# Patient Record
Sex: Female | Born: 1960 | Race: Black or African American | Hispanic: No | Marital: Married | Smoking: Current every day smoker
Health system: Southern US, Community
[De-identification: ages and names within clinical notes are randomized; demographics above are authoritative.]

## PROBLEM LIST (undated history)

## (undated) DIAGNOSIS — Z8719 Personal history of other diseases of the digestive system: Secondary | ICD-10-CM

## (undated) DIAGNOSIS — N200 Calculus of kidney: Secondary | ICD-10-CM

## (undated) DIAGNOSIS — I10 Essential (primary) hypertension: Secondary | ICD-10-CM

## (undated) DIAGNOSIS — J45909 Unspecified asthma, uncomplicated: Secondary | ICD-10-CM

## (undated) HISTORY — PX: TOTAL ABDOMINAL HYSTERECTOMY W/ BILATERAL SALPINGOOPHORECTOMY: SHX83

## (undated) HISTORY — PX: OTHER SURGICAL HISTORY: SHX169

---

## 1988-03-05 HISTORY — PX: UMBILICAL HERNIA REPAIR: SHX196

## 1997-10-30 ENCOUNTER — Emergency Department (HOSPITAL_COMMUNITY): Admission: EM | Admit: 1997-10-30 | Discharge: 1997-10-30 | Payer: Self-pay | Admitting: Emergency Medicine

## 1997-11-09 ENCOUNTER — Emergency Department (HOSPITAL_COMMUNITY): Admission: EM | Admit: 1997-11-09 | Discharge: 1997-11-09 | Payer: Self-pay | Admitting: Emergency Medicine

## 1997-11-23 ENCOUNTER — Encounter: Admission: RE | Admit: 1997-11-23 | Discharge: 1998-02-21 | Payer: Self-pay | Admitting: Orthopedic Surgery

## 1998-07-19 ENCOUNTER — Other Ambulatory Visit: Admission: RE | Admit: 1998-07-19 | Discharge: 1998-07-19 | Payer: Self-pay | Admitting: Ophthalmology

## 1999-08-09 ENCOUNTER — Emergency Department (HOSPITAL_COMMUNITY): Admission: EM | Admit: 1999-08-09 | Discharge: 1999-08-09 | Payer: Self-pay | Admitting: Emergency Medicine

## 2001-05-15 ENCOUNTER — Ambulatory Visit (HOSPITAL_COMMUNITY): Admission: RE | Admit: 2001-05-15 | Discharge: 2001-05-15 | Payer: Self-pay | Admitting: Family Medicine

## 2003-02-02 ENCOUNTER — Emergency Department (HOSPITAL_COMMUNITY): Admission: EM | Admit: 2003-02-02 | Discharge: 2003-02-02 | Payer: Self-pay | Admitting: Emergency Medicine

## 2003-12-08 ENCOUNTER — Emergency Department (HOSPITAL_COMMUNITY): Admission: EM | Admit: 2003-12-08 | Discharge: 2003-12-08 | Payer: Self-pay | Admitting: Emergency Medicine

## 2004-05-29 ENCOUNTER — Emergency Department (HOSPITAL_COMMUNITY): Admission: EM | Admit: 2004-05-29 | Discharge: 2004-05-29 | Payer: Self-pay | Admitting: Internal Medicine

## 2004-08-05 ENCOUNTER — Emergency Department (HOSPITAL_COMMUNITY): Admission: EM | Admit: 2004-08-05 | Discharge: 2004-08-05 | Payer: Self-pay | Admitting: Emergency Medicine

## 2005-01-19 ENCOUNTER — Emergency Department (HOSPITAL_COMMUNITY): Admission: EM | Admit: 2005-01-19 | Discharge: 2005-01-19 | Payer: Self-pay | Admitting: Emergency Medicine

## 2005-02-17 ENCOUNTER — Emergency Department (HOSPITAL_COMMUNITY): Admission: EM | Admit: 2005-02-17 | Discharge: 2005-02-17 | Payer: Self-pay | Admitting: Emergency Medicine

## 2005-04-30 ENCOUNTER — Ambulatory Visit (HOSPITAL_COMMUNITY): Admission: RE | Admit: 2005-04-30 | Discharge: 2005-04-30 | Payer: Self-pay | Admitting: Family Medicine

## 2005-05-14 ENCOUNTER — Ambulatory Visit: Payer: Self-pay | Admitting: Family Medicine

## 2005-05-25 ENCOUNTER — Ambulatory Visit: Payer: Self-pay | Admitting: Family Medicine

## 2005-08-16 ENCOUNTER — Emergency Department (HOSPITAL_COMMUNITY): Admission: EM | Admit: 2005-08-16 | Discharge: 2005-08-16 | Payer: Self-pay | Admitting: Emergency Medicine

## 2007-12-10 ENCOUNTER — Emergency Department (HOSPITAL_COMMUNITY): Admission: EM | Admit: 2007-12-10 | Discharge: 2007-12-10 | Payer: Self-pay | Admitting: Family Medicine

## 2008-04-17 ENCOUNTER — Emergency Department (HOSPITAL_COMMUNITY): Admission: EM | Admit: 2008-04-17 | Discharge: 2008-04-17 | Payer: Self-pay | Admitting: Family Medicine

## 2008-05-21 ENCOUNTER — Emergency Department (HOSPITAL_COMMUNITY): Admission: EM | Admit: 2008-05-21 | Discharge: 2008-05-21 | Payer: Self-pay | Admitting: Family Medicine

## 2008-10-16 ENCOUNTER — Emergency Department (HOSPITAL_COMMUNITY): Admission: EM | Admit: 2008-10-16 | Discharge: 2008-10-16 | Payer: Self-pay | Admitting: Family Medicine

## 2009-01-19 ENCOUNTER — Emergency Department (HOSPITAL_COMMUNITY): Admission: EM | Admit: 2009-01-19 | Discharge: 2009-01-19 | Payer: Self-pay | Admitting: Family Medicine

## 2009-04-06 ENCOUNTER — Emergency Department (HOSPITAL_COMMUNITY): Admission: EM | Admit: 2009-04-06 | Discharge: 2009-04-06 | Payer: Self-pay | Admitting: Emergency Medicine

## 2009-06-29 ENCOUNTER — Emergency Department (HOSPITAL_COMMUNITY): Admission: EM | Admit: 2009-06-29 | Discharge: 2009-06-29 | Payer: Self-pay | Admitting: Family Medicine

## 2009-10-04 ENCOUNTER — Ambulatory Visit (HOSPITAL_COMMUNITY): Admission: RE | Admit: 2009-10-04 | Discharge: 2009-10-04 | Payer: Self-pay | Admitting: Internal Medicine

## 2009-10-17 ENCOUNTER — Ambulatory Visit: Payer: Self-pay | Admitting: Family Medicine

## 2009-11-08 ENCOUNTER — Ambulatory Visit: Payer: Self-pay | Admitting: Internal Medicine

## 2009-11-08 ENCOUNTER — Encounter (INDEPENDENT_AMBULATORY_CARE_PROVIDER_SITE_OTHER): Payer: Self-pay | Admitting: Family Medicine

## 2009-11-08 LAB — CONVERTED CEMR LAB
ALT: 13 units/L (ref 0–35)
Alkaline Phosphatase: 50 units/L (ref 39–117)
Basophils Absolute: 0 10*3/uL (ref 0.0–0.1)
Eosinophils Absolute: 0.2 10*3/uL (ref 0.0–0.7)
Eosinophils Relative: 2 % (ref 0–5)
Glucose, Bld: 70 mg/dL (ref 70–99)
HCT: 41.7 % (ref 36.0–46.0)
MCHC: 31.9 g/dL (ref 30.0–36.0)
MCV: 86.3 fL (ref 78.0–100.0)
Platelets: 230 10*3/uL (ref 150–400)
RDW: 15.4 % (ref 11.5–15.5)
Sodium: 136 meq/L (ref 135–145)
Total Bilirubin: 0.3 mg/dL (ref 0.3–1.2)
Total Protein: 6.8 g/dL (ref 6.0–8.3)

## 2009-11-24 ENCOUNTER — Emergency Department (HOSPITAL_COMMUNITY): Admission: EM | Admit: 2009-11-24 | Discharge: 2009-11-24 | Payer: Self-pay | Admitting: Emergency Medicine

## 2009-12-16 ENCOUNTER — Encounter (INDEPENDENT_AMBULATORY_CARE_PROVIDER_SITE_OTHER): Payer: Self-pay | Admitting: *Deleted

## 2009-12-16 LAB — CONVERTED CEMR LAB
HDL: 51 mg/dL (ref >39)
Total CHOL/HDL Ratio: 3.4
Triglycerides: 181 mg/dL — ABNORMAL HIGH (ref <150)

## 2010-03-05 ENCOUNTER — Emergency Department (HOSPITAL_COMMUNITY)
Admission: EM | Admit: 2010-03-05 | Discharge: 2010-03-06 | Payer: Self-pay | Source: Home / Self Care | Admitting: Emergency Medicine

## 2010-05-15 LAB — DIFFERENTIAL
Lymphocytes Relative: 22 % (ref 12–46)
Lymphs Abs: 2.5 10*3/uL (ref 0.7–4.0)
Monocytes Absolute: 0.8 10*3/uL (ref 0.1–1.0)
Monocytes Relative: 7 % (ref 3–12)
Neutro Abs: 8 10*3/uL — ABNORMAL HIGH (ref 1.7–7.7)
Neutrophils Relative %: 69 % (ref 43–77)

## 2010-05-15 LAB — CBC
MCHC: 32.6 g/dL (ref 30.0–36.0)
MCV: 84.4 fL (ref 78.0–100.0)
Platelets: 268 10*3/uL (ref 150–400)
RDW: 15.1 % (ref 11.5–15.5)
WBC: 11.6 10*3/uL — ABNORMAL HIGH (ref 4.0–10.5)

## 2010-05-15 LAB — COMPREHENSIVE METABOLIC PANEL
Albumin: 4 g/dL (ref 3.5–5.2)
Alkaline Phosphatase: 64 U/L (ref 39–117)
BUN: 4 mg/dL — ABNORMAL LOW (ref 6–23)
Calcium: 9.5 mg/dL (ref 8.4–10.5)
Creatinine, Ser: 0.98 mg/dL (ref 0.4–1.2)
Potassium: 3.8 mEq/L (ref 3.5–5.1)
Total Protein: 6.9 g/dL (ref 6.0–8.3)

## 2010-05-15 LAB — POCT CARDIAC MARKERS
CKMB, poc: 3.8 ng/mL (ref 1.0–8.0)
Myoglobin, poc: 111 ng/mL (ref 12–200)
Myoglobin, poc: 94.4 ng/mL (ref 12–200)
Troponin i, poc: <0.05 ng/mL (ref 0.00–0.09)

## 2010-06-20 LAB — POCT URINALYSIS DIP (DEVICE)
Ketones, ur: NEGATIVE mg/dL
Protein, ur: NEGATIVE mg/dL
Specific Gravity, Urine: 1.025 (ref 1.005–1.030)
Urobilinogen, UA: 0.2 mg/dL (ref 0.0–1.0)

## 2010-07-07 ENCOUNTER — Inpatient Hospital Stay (INDEPENDENT_AMBULATORY_CARE_PROVIDER_SITE_OTHER)
Admission: RE | Admit: 2010-07-07 | Discharge: 2010-07-07 | Disposition: A | Discharge status: Home / Self Care | Payer: Self-pay | Source: Ambulatory Visit | Attending: Family Medicine | Admitting: Family Medicine

## 2010-07-07 DIAGNOSIS — J019 Acute sinusitis, unspecified: Secondary | ICD-10-CM

## 2010-10-16 ENCOUNTER — Other Ambulatory Visit (HOSPITAL_COMMUNITY): Payer: Self-pay | Admitting: Family Medicine

## 2010-10-16 DIAGNOSIS — Z1231 Encounter for screening mammogram for malignant neoplasm of breast: Secondary | ICD-10-CM

## 2010-10-19 ENCOUNTER — Ambulatory Visit (HOSPITAL_COMMUNITY)
Admission: RE | Admit: 2010-10-19 | Discharge: 2010-10-19 | Disposition: A | Discharge status: Home / Self Care | Payer: Self-pay | Source: Ambulatory Visit | Attending: Family Medicine | Admitting: Family Medicine

## 2010-10-19 DIAGNOSIS — Z1231 Encounter for screening mammogram for malignant neoplasm of breast: Secondary | ICD-10-CM

## 2010-10-24 ENCOUNTER — Other Ambulatory Visit: Payer: Self-pay | Admitting: Family Medicine

## 2010-10-24 DIAGNOSIS — R928 Other abnormal and inconclusive findings on diagnostic imaging of breast: Secondary | ICD-10-CM

## 2010-10-31 ENCOUNTER — Ambulatory Visit
Admission: RE | Admit: 2010-10-31 | Discharge: 2010-10-31 | Disposition: A | Discharge status: Home / Self Care | Payer: No Typology Code available for payment source | Source: Ambulatory Visit | Attending: Family Medicine | Admitting: Family Medicine

## 2010-10-31 DIAGNOSIS — R928 Other abnormal and inconclusive findings on diagnostic imaging of breast: Secondary | ICD-10-CM

## 2011-07-10 ENCOUNTER — Other Ambulatory Visit: Payer: Self-pay

## 2011-07-10 ENCOUNTER — Other Ambulatory Visit (HOSPITAL_COMMUNITY)
Admission: RE | Admit: 2011-07-10 | Discharge: 2011-07-10 | Disposition: A | Discharge status: Home / Self Care | Payer: PRIVATE HEALTH INSURANCE | Source: Ambulatory Visit | Attending: Family Medicine | Admitting: Family Medicine

## 2011-07-10 DIAGNOSIS — Z01419 Encounter for gynecological examination (general) (routine) without abnormal findings: Secondary | ICD-10-CM | POA: Insufficient documentation

## 2012-05-23 ENCOUNTER — Ambulatory Visit (INDEPENDENT_AMBULATORY_CARE_PROVIDER_SITE_OTHER): Payer: No Typology Code available for payment source | Admitting: Internal Medicine

## 2012-05-23 ENCOUNTER — Encounter: Payer: Self-pay | Admitting: Internal Medicine

## 2012-05-23 VITALS — BP 114/84 | HR 78 | Temp 97.5°F | Ht 65.0 in | Wt 185.2 lb

## 2012-05-23 DIAGNOSIS — R5381 Other malaise: Secondary | ICD-10-CM

## 2012-05-23 DIAGNOSIS — F17200 Nicotine dependence, unspecified, uncomplicated: Secondary | ICD-10-CM

## 2012-05-23 DIAGNOSIS — Z9289 Personal history of other medical treatment: Secondary | ICD-10-CM

## 2012-05-23 DIAGNOSIS — Z716 Tobacco abuse counseling: Secondary | ICD-10-CM | POA: Insufficient documentation

## 2012-05-23 DIAGNOSIS — Z23 Encounter for immunization: Secondary | ICD-10-CM

## 2012-05-23 DIAGNOSIS — R5383 Other fatigue: Secondary | ICD-10-CM

## 2012-05-23 DIAGNOSIS — Z9189 Other specified personal risk factors, not elsewhere classified: Secondary | ICD-10-CM

## 2012-05-23 LAB — BASIC METABOLIC PANEL WITH GFR
BUN: 11 mg/dL (ref 6–23)
CO2: 25 mEq/L (ref 19–32)
Calcium: 9.6 mg/dL (ref 8.4–10.5)
GFR, Est African American: >89 mL/min
Glucose, Bld: 87 mg/dL (ref 70–99)
Potassium: 4.1 mEq/L (ref 3.5–5.3)

## 2012-05-23 LAB — LIPID PANEL
Cholesterol: 155 mg/dL (ref 0–200)
LDL Cholesterol: 102 mg/dL — ABNORMAL HIGH (ref 0–99)
Total CHOL/HDL Ratio: 3.7 Ratio
Triglycerides: 55 mg/dL (ref <150)
VLDL: 11 mg/dL (ref 0–40)

## 2012-05-23 LAB — CBC
Hemoglobin: 12.7 g/dL (ref 12.0–15.0)
MCH: 26.7 pg (ref 26.0–34.0)
MCHC: 32.9 g/dL (ref 30.0–36.0)
Platelets: 246 10*3/uL (ref 150–400)

## 2012-05-23 LAB — POCT GLYCOSYLATED HEMOGLOBIN (HGB A1C): Hemoglobin A1C: 5.3

## 2012-05-23 LAB — GLUCOSE, CAPILLARY: Glucose-Capillary: 83 mg/dL (ref 70–99)

## 2012-05-23 MED ORDER — TETANUS-DIPHTH-ACELL PERTUSSIS 5-2.5-18.5 LF-MCG/0.5 IM SUSP: 0.5000 mL | Freq: Once | INTRAMUSCULAR | Status: DC

## 2012-05-23 NOTE — Patient Instructions (Signed)
Welcome to our clinic! We are always here if you have questions or problems. Our number is [phone]. Your new doctor's name is Dr. Genella Mech.  We will give you a tetanus shot today and take some blood. I will call you with the results.   Come back in 6 months.   Smoking Cessation, Tips for Success YOU CAN QUIT SMOKING If you are ready to quit smoking, congratulations! You have chosen to help yourself be healthier. Cigarettes bring nicotine, tar, carbon monoxide, and other irritants into your body. Your lungs, heart, and blood vessels will be able to work better without these poisons. There are many different ways to quit smoking. Nicotine gum, nicotine patches, a nicotine inhaler, or nicotine nasal spray can help with physical craving. Hypnosis, support groups, and medicines help break the habit of smoking. Here are some tips to help you quit for good.  Throw away all cigarettes.  Clean and remove all ashtrays from your home, work, and car.  On a card, write down your reasons for quitting. Carry the card with you and read it when you get the urge to smoke.  Cleanse your body of nicotine. Drink enough water and fluids to keep your urine clear or pale yellow. Do this after quitting to flush the nicotine from your body.  Learn to predict your moods. Do not let a bad situation be your excuse to have a cigarette. Some situations in your life might tempt you into wanting a cigarette.  Never have "just one" cigarette. It leads to wanting another and another. Remind yourself of your decision to quit.  Change habits associated with smoking. If you smoked while driving or when feeling stressed, try other activities to replace smoking. Stand up when drinking your coffee. Brush your teeth after eating. Sit in a different chair when you read the paper. Avoid alcohol while trying to quit, and try to drink fewer caffeinated beverages. Alcohol and caffeine may urge you to smoke.  Avoid foods and  drinks that can trigger a desire to smoke, such as sugary or spicy foods and alcohol.  Ask people who smoke not to smoke around you.  Have something planned to do right after eating or having a cup of coffee. Take a walk or exercise to perk you up. This will help to keep you from overeating.  Try a relaxation exercise to calm you down and decrease your stress. Remember, you may be tense and nervous for the first 2 weeks after you quit, but this will pass.  Find new activities to keep your hands busy. Play with a pen, coin, or rubber band. Doodle or draw things on paper.  Brush your teeth right after eating. This will help cut down on the craving for the taste of tobacco after meals. You can try mouthwash, too.  Use oral substitutes, such as lemon drops, carrots, a cinnamon stick, or chewing gum, in place of cigarettes. Keep them handy so they are available when you have the urge to smoke.  When you have the urge to smoke, try deep breathing.  Designate your home as a nonsmoking area.  If you are a heavy smoker, ask your caregiver about a prescription for nicotine chewing gum. It can ease your withdrawal from nicotine.  Reward yourself. Set aside the cigarette money you save and buy yourself something nice.  Look for support from others. Join a support group or smoking cessation program. Ask someone at home or at work to help you with your  plan to quit smoking.  Always ask yourself, "Do I need this cigarette or is this just a reflex?" Tell yourself, "Today, I choose not to smoke," or "I do not want to smoke." You are reminding yourself of your decision to quit, even if you do smoke a cigarette. HOW WILL I FEEL WHEN I QUIT SMOKING?  The benefits of not smoking start within days of quitting.  You may have symptoms of withdrawal because your body is used to nicotine (the addictive substance in cigarettes). You may crave cigarettes, be irritable, feel very hungry, cough often, get headaches,  or have difficulty concentrating.  The withdrawal symptoms are only temporary. They are strongest when you first quit but will go away within 10 to 14 days.  When withdrawal symptoms occur, stay in control. Think about your reasons for quitting. Remind yourself that these are signs that your body is healing and getting used to being without cigarettes.  Remember that withdrawal symptoms are easier to treat than the major diseases that smoking can cause.  Even after the withdrawal is over, expect periodic urges to smoke. However, these cravings are generally short-lived and will go away whether you smoke or not. Do not smoke!  If you relapse and smoke again, do not lose hope. Most smokers quit 3 times before they are successful.  If you relapse, do not give up! Plan ahead and think about what you will do the next time you get the urge to smoke. LIFE AS A NONSMOKER: MAKE IT FOR A MONTH, MAKE IT FOR LIFE Day 1: Hang this page where you will see it every day. Day 2: Get rid of all ashtrays, matches, and lighters. Day 3: Drink water. Breathe deeply between sips. Day 4: Avoid places with smoke-filled air, such as bars, clubs, or the smoking section of restaurants. Day 5: Keep track of how much money you save by not smoking. Day 6: Avoid boredom. Keep a good book with you or go to the movies. Day 7: Reward yourself! One week without smoking! Day 8: Make a dental appointment to get your teeth cleaned. Day 9: Decide how you will turn down a cigarette before it is offered to you. Day 10: Review your reasons for quitting. Day 11: Distract yourself. Stay active to keep your mind off smoking and to relieve tension. Take a walk, exercise, read a book, do a crossword puzzle, or try a new hobby. Day 12: Exercise. Get off the bus before your stop or use stairs instead of escalators. Day 13: Call on friends for support and encouragement. Day 14: Reward yourself! Two weeks without smoking! Day 15: Practice  deep breathing exercises. Day 16: Bet a friend that you can stay a nonsmoker. Day 17: Ask to sit in nonsmoking sections of restaurants. Day 18: Hang up "No Smoking" signs. Day 19: Think of yourself as a nonsmoker. Day 20: Each morning, tell yourself you will not smoke. Day 21: Reward yourself! Three weeks without smoking! Day 22: Think of smoking in negative ways. Remember how it stains your teeth, gives you bad breath, and leaves you short of breath. Day 23: Eat a nutritious breakfast. Day 24:Do not relive your days as a smoker. Day 25: Hold a pencil in your hand when talking on the telephone. Day 26: Tell all your friends you do not smoke. Day 27: Think about how much better food tastes. Day 28: Remember, one cigarette is one too many. Day 29: Take up a hobby that will keep your  hands busy. Day 30: Congratulations! One month without smoking! Give yourself a big reward. Your caregiver can direct you to community resources or hospitals for support, which may include:  Group support.  Education.  Hypnosis.  Subliminal therapy. Document Released: 11/18/2003 Document Revised: 05/14/2011 Document Reviewed: 12/06/2008 [facility] Patient Information 2013 [place], [place].

## 2012-05-23 NOTE — Assessment & Plan Note (Signed)
  Assessment:  Progress toward smoking cessation:  smoking the same amount  Barriers to progress toward smoking cessation:  none  Comments:   Plan:  Instruction/counseling given:  I advised patient to stop smoking.  Educational resources provided:  smoking cessation handout (tips, strategies, fact sheets)  Self management tools provided:  smoking cessation plan (STAR Quit Plan)  Medications to assist with smoking cessation:  none Patient agreed to the following self-care plans for smoking cessation:  go to the [employer] ([email or web address])   Other:

## 2012-05-23 NOTE — Progress Notes (Signed)
Subjective:     Patient ID: Christy Burke, female   DOB: 02/18/1961, 53 y.o.   MRN: [id number]  HPI The patient is a 52 year old female who comes in today as a new patient from [facility]. She states that she has no past medical history and takes no medications. She states that she's never had a problem with her sugars, her cholesterol, her blood pressure. She states that she does smoke one half pack per day although she recently quit for 120 days and then restarted due to some stress in her life. She states that she did used to do cocaine however quit several years ago and is nontender to restart. She also used to drink heavily per her words although quit several years ago. She does not have any breathing problems. She does not have any chest pain, shortness of breath, nausea, vomiting, diarrhea. She would like to be tested for HIV at today's visit. She did recently have Pap smear last May. She cannot remember when her last tetanus shot was although it may have been slightly less than 10 years ago.  Past medical history, social history, family history, allergies, medications, problem list were all updated and reviewed at today's visit.  Review of Systems  Constitutional: Negative for fever, chills, diaphoresis, activity change, appetite change, fatigue and unexpected weight change.  HENT: Negative.   Eyes: Negative.   Respiratory: Negative for cough, chest tightness, shortness of breath and wheezing.   Cardiovascular: Negative for chest pain, palpitations and leg swelling.  Gastrointestinal: Negative for nausea, vomiting, abdominal pain, diarrhea, constipation and blood in stool.  Endocrine: Negative.   Genitourinary: Negative.   Musculoskeletal: Negative for myalgias, back pain, joint swelling, arthralgias and gait problem.  Skin: Negative for color change, pallor, rash and wound.  Allergic/Immunologic: Negative.   Neurological: Negative for dizziness, tremors, seizures, syncope, facial  asymmetry, speech difficulty, weakness, light-headedness, numbness and headaches.  Hematological: Negative.   Psychiatric/Behavioral: Negative.        Objective:   Physical Exam  Constitutional: She is oriented to person, place, and time. She appears well-developed and well-nourished.  Hair in deadlocks.  HENT:  Head: Normocephalic and atraumatic.  Eyes: EOM are normal. Pupils are equal, round, and reactive to light.  Neck: Normal range of motion. Neck supple. No JVD present. No tracheal deviation present. No thyromegaly present.  Cardiovascular: Normal rate and regular rhythm.   Pulmonary/Chest: Effort normal and breath sounds normal. No respiratory distress. She has no wheezes. She has no rales.  Abdominal: Soft. Bowel sounds are normal. She exhibits no distension. There is no tenderness. There is no rebound.  Musculoskeletal: Normal range of motion. She exhibits no edema and no tenderness.  Neurological: She is alert and oriented to person, place, and time. No cranial nerve deficit. Coordination normal.  Skin: Skin is warm and dry. No rash noted. No erythema. No pallor.       Assessment/Plan   1. Please see problem oriented charting.  2 Disposition-patient be seen back in 6 months for routine followup visit. She was given Tdap at today's visit. She was also given order for mammography. She also had CBC, basic metabolic panel, lipid panel, hemoglobin A1c. She does not wish flu shot at today's visit. She was advised that she should quit smoking and she was given information regarding smoking cessation tips.

## 2012-05-30 ENCOUNTER — Ambulatory Visit (HOSPITAL_COMMUNITY)
Admission: RE | Admit: 2012-05-30 | Discharge: 2012-05-30 | Disposition: A | Discharge status: Home / Self Care | Payer: No Typology Code available for payment source | Source: Ambulatory Visit | Attending: Internal Medicine | Admitting: Internal Medicine

## 2012-05-30 ENCOUNTER — Ambulatory Visit (HOSPITAL_COMMUNITY): Payer: No Typology Code available for payment source

## 2012-05-30 DIAGNOSIS — Z9289 Personal history of other medical treatment: Secondary | ICD-10-CM

## 2012-05-30 DIAGNOSIS — Z1231 Encounter for screening mammogram for malignant neoplasm of breast: Secondary | ICD-10-CM | POA: Insufficient documentation

## 2012-10-15 ENCOUNTER — Ambulatory Visit (INDEPENDENT_AMBULATORY_CARE_PROVIDER_SITE_OTHER): Payer: No Typology Code available for payment source | Admitting: Internal Medicine

## 2012-10-15 ENCOUNTER — Encounter: Payer: Self-pay | Admitting: Internal Medicine

## 2012-10-15 VITALS — BP 118/83 | HR 93 | Temp 97.0°F | Ht 67.0 in | Wt 184.2 lb

## 2012-10-15 DIAGNOSIS — R229 Localized swelling, mass and lump, unspecified: Secondary | ICD-10-CM | POA: Insufficient documentation

## 2012-10-15 NOTE — Patient Instructions (Signed)
Please return to the clinic to see Dr. Dorise Hiss in approximately 6 weeks for your annual physical and to recheck you nodule  1. Continue monitoring the left nodule on your face; today it measures slightly less than 1cm 2. Notify Dr. Dorise Hiss of any changes noted

## 2012-10-15 NOTE — Assessment & Plan Note (Signed)
Pt reports a small non-pruritic, non-painful nodule on the left side of her face; denies any fever/chills, fatigue, night sweats, or any additional lumps or bumps.   She has never had anything like this before and is concerned about what this might be.  It measures about 1cm X 0.5cm and is slightly tender to touch and without erythema.  Possibilities include a parotid gland stone, however it is slightly posterior to be the parotid gland.    -monitor the nodule closely and observe if it is increasing in size  -f/u with Dr. Dorise Hiss at your regular visit in about 6 weeks -may consider an ENT referral if increasing in size or does not resolve

## 2012-10-15 NOTE — Progress Notes (Signed)
I saw and evaluated the patient.  I personally confirmed the key portions of the history and exam documented by Dr. Delane Ginger and I reviewed pertinent patient test results.  The assessment, diagnosis, and plan were formulated together and I agree with the documentation in the resident's note. There is only the isolated finding anterior to the earlobe. No post auricular, cervical, supraclavicular LAD. No parotid gland tenderness. Agree with reassessment.

## 2012-10-15 NOTE — Progress Notes (Signed)
Patient ID: Christy Burke, female   DOB: 1960/12/16, 52 y.o.   MRN: [id number]            HPI: [name] is a 52 y.o. here for an acute visit for a lump on the left side of her face.  She recently established care here with Dr. Dorise Hiss in March.  She has no PMH and takes no medications.  She reports that a couple of days ago she was lying in bed and feeling her face when she noticed a small lump on the left side of her face.  She denies any fever/chills, fatigue, night sweats, or any additional lumps or bumps.  She states it is not painful to touch and does not itch.  She has never had anything like this before and is concerned about what this might be.     No past medical history on file. No current outpatient prescriptions on file.   No current facility-administered medications for this visit.   Family History  Problem Relation Age of Onset  . Cancer Mother   . Diabetes Father    History   Social History  . Marital Status: Single    Spouse Name: N/A    Number of Children: N/A  . Years of Education: N/A   Social History Main Topics  . Smoking status: Current Every Day Smoker -- 0.50 packs/day    Types: Cigarettes  . Smokeless tobacco: None  . Alcohol Use: No     Comment: Quit several years ago  . Drug Use: No     Comment: Quit several years ago, never did IVDU only smoked cocaine  . Sexual Activity: Yes     Comment: In same sex relationship for 19 years   Other Topics Concern  . None   Social History Narrative  . None    Review of Systems: Constitutional: Denies fever, chills, diaphoresis, appetite change and fatigue.  Respiratory: Denies SOB, DOE, cough, chest tightness, and wheezing.  Cardiovascular: No chest pain, palpitations and leg swelling.  Gastrointestinal: No abdominal pain, nausea, vomiting, bloody stools Genitourinary: No dysuria, frequency, hematuria, or flank pain.  Musculoskeletal: No myalgias, back pain, joint swelling, arthralgias     Objective:  Physical Exam: Filed Vitals:   10/15/12 0852  BP: 118/83  Pulse: 93  Temp: 97 F (36.1 C)  TempSrc: Oral  Height: 5\' 7"  (1.702 m)  Weight: 184 lb 3.2 oz (83.553 kg)  SpO2: 98%   General: Well nourished. No acute distress.  Neck: There is no lymphadenopathy Lungs: CTA bilaterally. Heart: RRR; no extra sounds or murmurs. Abdomen: Non-distended, normal BS, soft, nontender; no hepatosplenomegaly  Extremities: No pedal edema. No joint swelling or tenderness. Neurologic: Alert and oriented x3. No obvious neurologic deficits. Skin: Left side of face inferior to the left cheek she has about a 1cm X 0.5 cm nodule not visible by observation-only palpable with touch.  There is no erythema but is slightly tender to palpation if you press deeper.   Assessment & Plan:  I have discussed my assessment and plan with Dr. Rogelia Boga  as detailed under problem based charting.

## 2012-11-21 ENCOUNTER — Encounter: Payer: Self-pay | Admitting: Internal Medicine

## 2012-11-21 ENCOUNTER — Ambulatory Visit (INDEPENDENT_AMBULATORY_CARE_PROVIDER_SITE_OTHER): Payer: No Typology Code available for payment source | Admitting: Internal Medicine

## 2012-11-21 VITALS — BP 111/77 | HR 67 | Temp 98.4°F | Ht 66.0 in | Wt 185.1 lb

## 2012-11-21 DIAGNOSIS — Z0000 Encounter for general adult medical examination without abnormal findings: Secondary | ICD-10-CM

## 2012-11-21 DIAGNOSIS — Z09 Encounter for follow-up examination after completed treatment for conditions other than malignant neoplasm: Secondary | ICD-10-CM

## 2012-11-21 DIAGNOSIS — F17200 Nicotine dependence, unspecified, uncomplicated: Secondary | ICD-10-CM

## 2012-11-21 NOTE — Patient Instructions (Signed)
We will send you for a colon cancer screening test.   I have given you some back stretching exercises that you can do and you can also use heat or tylenol for your back pain.  We will see you back in 1 year and call us if you need [place] before then at [phone].   Back Exercises These exercises may help you when beginning to rehabilitate your injury. Your symptoms may resolve with or without further involvement from your physician, physical therapist or athletic trainer. While completing these exercises, remember:   Restoring tissue flexibility helps normal motion to return to the joints. This allows healthier, less painful movement and activity.  An effective stretch should be held for at least 30 seconds.  A stretch should never be painful. You should only feel a gentle lengthening or release in the stretched tissue. STRETCH  Extension, Prone on Elbows   Lie on your stomach on the floor, a bed will be too soft. Place your palms about shoulder width apart and at the height of your head.  Place your elbows under your shoulders. If this is too painful, stack pillows under your chest.  Allow your body to relax so that your hips drop lower and make contact more completely with the floor.  Hold this position for __________ seconds.  Slowly return to lying flat on the floor. Repeat __________ times. Complete this exercise __________ times per day.  RANGE OF MOTION  Extension, Prone Press Ups   Lie on your stomach on the floor, a bed will be too soft. Place your palms about shoulder width apart and at the height of your head.  Keeping your back as relaxed as possible, slowly straighten your elbows while keeping your hips on the floor. You may adjust the placement of your hands to maximize your comfort. As you gain motion, your hands will come more underneath your shoulders.  Hold this position __________ seconds.  Slowly return to lying flat on the floor. Repeat __________ times.  Complete this exercise __________ times per day.  RANGE OF MOTION- Quadruped, Neutral Spine   Assume a hands and knees position on a firm surface. Keep your hands under your shoulders and your knees under your hips. You may place padding under your knees for comfort.  Drop your head and point your tail bone toward the ground below you. This will round out your low back like an angry cat. Hold this position for __________ seconds.  Slowly lift your head and release your tail bone so that your back sags into a large arch, like an old horse.  Hold this position for __________ seconds.  Repeat this until you feel limber in your low back.  Now, find your "sweet spot." This will be the most comfortable position somewhere between the two previous positions. This is your neutral spine. Once you have found this position, tense your stomach muscles to support your low back.  Hold this position for __________ seconds. Repeat __________ times. Complete this exercise __________ times per day.  STRETCH  Flexion, Single Knee to Chest   Lie on a firm bed or floor with both legs extended in front of you.  Keeping one leg in contact with the floor, bring your opposite knee to your chest. Hold your leg in place by either grabbing behind your thigh or at your knee.  Pull until you feel a gentle stretch in your low back. Hold __________ seconds.  Slowly release your grasp and repeat the exercise with  the opposite side. Repeat __________ times. Complete this exercise __________ times per day.  STRETCH - Hamstrings, Standing  Stand or sit and extend your right / left leg, placing your foot on a chair or foot stool  Keeping a slight arch in your low back and your hips straight forward.  Lead with your chest and lean forward at the waist until you feel a gentle stretch in the back of your right / left knee or thigh. (When done correctly, this exercise requires leaning only a small distance.)  Hold this  position for __________ seconds. Repeat __________ times. Complete this stretch __________ times per day. STRENGTHENING  Deep Abdominals, Pelvic Tilt   Lie on a firm bed or floor. Keeping your legs in front of you, bend your knees so they are both pointed toward the ceiling and your feet are flat on the floor.  Tense your lower abdominal muscles to press your low back into the floor. This motion will rotate your pelvis so that your tail bone is scooping upwards rather than pointing at your feet or into the floor.  With a gentle tension and even breathing, hold this position for __________ seconds. Repeat __________ times. Complete this exercise __________ times per day.  STRENGTHENING  Abdominals, Crunches   Lie on a firm bed or floor. Keeping your legs in front of you, bend your knees so they are both pointed toward the ceiling and your feet are flat on the floor. Cross your arms over your chest.  Slightly tip your chin down without bending your neck.  Tense your abdominals and slowly lift your trunk high enough to just clear your shoulder blades. Lifting higher can put excessive stress on the low back and does not further strengthen your abdominal muscles.  Control your return to the starting position. Repeat __________ times. Complete this exercise __________ times per day.  STRENGTHENING  Quadruped, Opposite UE/LE Lift   Assume a hands and knees position on a firm surface. Keep your hands under your shoulders and your knees under your hips. You may place padding under your knees for comfort.  Find your neutral spine and gently tense your abdominal muscles so that you can maintain this position. Your shoulders and hips should form a rectangle that is parallel with the floor and is not twisted.  Keeping your trunk steady, lift your right hand no higher than your shoulder and then your left leg no higher than your hip. Make sure you are not holding your breath. Hold this position  __________ seconds.  Continuing to keep your abdominal muscles tense and your back steady, slowly return to your starting position. Repeat with the opposite arm and leg. Repeat __________ times. Complete this exercise __________ times per day. Document Released: 03/09/2005 Document Revised: 05/14/2011 Document Reviewed: 06/03/2008 [facility] Patient Information 2014 [place], [place].

## 2012-11-21 NOTE — Progress Notes (Signed)
Subjective:     Patient ID: Christy Burke, female   DOB: 07-30-1960, 52 y.o.   MRN: [id number]  HPI  The patient is a 52 year old female with past medical history of tobacco abuse who comes in today for follow up. She was recently a new patient at this practice. She was seen in clinic about 1 month ago for a lump on her face and was given reassurance. She does not have any breathing problems. She does not have any chest pain, shortness of breath, nausea, vomiting, diarrhea. She states that she does have some back discomfort after work at times. She does physical labor at work and states that some motrin did not improve the pain. She did feel better after a warm shower.   Review of Systems  Constitutional: Negative for fever, chills, diaphoresis, activity change, appetite change, fatigue and unexpected weight change.  HENT: Negative.   Eyes: Negative.   Respiratory: Negative for cough, chest tightness, shortness of breath and wheezing.   Cardiovascular: Negative for chest pain, palpitations and leg swelling.  Gastrointestinal: Negative for nausea, vomiting, abdominal pain, diarrhea, constipation and blood in stool.  Endocrine: Negative.   Genitourinary: Negative.   Musculoskeletal: Negative for myalgias, back pain, joint swelling, arthralgias and gait problem.  Skin: Negative for color change, pallor, rash and wound.  Allergic/Immunologic: Negative.   Neurological: Negative for dizziness, tremors, seizures, syncope, facial asymmetry, speech difficulty, weakness, light-headedness, numbness and headaches.  Hematological: Negative.   Psychiatric/Behavioral: Negative.       Objective:   Physical Exam  Constitutional: She is oriented to person, place, and time. She appears well-developed and well-nourished.  Hair in deadlocks.  HENT:  Head: Normocephalic and atraumatic.  Eyes: EOM are normal. Pupils are equal, round, and reactive to light.  Neck: Normal range of motion. Neck supple. No JVD  present. No tracheal deviation present. No thyromegaly present.  Cardiovascular: Normal rate and regular rhythm.   Pulmonary/Chest: Effort normal and breath sounds normal. No respiratory distress. She has no wheezes. She has no rales.  Abdominal: Soft. Bowel sounds are normal. She exhibits no distension. There is no tenderness. There is no rebound.  Musculoskeletal: Normal range of motion. She exhibits no edema and no tenderness.  Neurological: She is alert and oriented to person, place, and time. No cranial nerve deficit. Coordination normal.  Skin: Skin is warm and dry. No rash noted. No erythema. No pallor.       Assessment/Plan   1. Please see problem oriented charting.  2 Disposition-Patient will be seen back in 1 year for routine followup visit. She does not wish flu shot at today's visit. She was advised that she should quit smoking and she was given information regarding smoking cessation tips. She will use tylenol or heat on her back pain and stretching exercises were given.

## 2012-11-22 NOTE — Assessment & Plan Note (Signed)
  Assessment: Progress toward smoking cessation:  smoking the same amount Barriers to progress toward smoking cessation:  lack of motivation to quit;stress Comments:   Plan: Instruction/counseling given:  I counseled patient on the dangers of tobacco use, advised patient to stop smoking, and reviewed strategies to maximize success. Educational resources provided:  [employer]) brochure Self management tools provided:    Medications to assist with smoking cessation:  None Patient agreed to the following self-care plans for smoking cessation:    Other plans:

## 2012-11-28 NOTE — Progress Notes (Signed)
Case discussed with Dr. Kollar soon after the resident saw the patient.  We reviewed the resident's history and exam and pertinent patient test results.  I agree with the assessment, diagnosis, and plan of care documented in the resident's note. 

## 2013-01-08 ENCOUNTER — Ambulatory Visit (AMBULATORY_SURGERY_CENTER): Payer: Self-pay | Admitting: *Deleted

## 2013-01-08 VITALS — Ht 65.5 in | Wt 188.8 lb

## 2013-01-08 DIAGNOSIS — Z1211 Encounter for screening for malignant neoplasm of colon: Secondary | ICD-10-CM

## 2013-01-08 MED ORDER — MOVIPREP 100 G PO SOLR: ORAL | Status: DC

## 2013-01-22 ENCOUNTER — Encounter: Payer: Self-pay | Admitting: Internal Medicine

## 2013-01-22 ENCOUNTER — Ambulatory Visit (AMBULATORY_SURGERY_CENTER): Payer: No Typology Code available for payment source | Admitting: Internal Medicine

## 2013-01-22 VITALS — BP 127/94 | HR 65 | Temp 96.9°F | Resp 20 | Ht 65.0 in | Wt 188.0 lb

## 2013-01-22 DIAGNOSIS — Z1211 Encounter for screening for malignant neoplasm of colon: Secondary | ICD-10-CM

## 2013-01-22 MED ORDER — SODIUM CHLORIDE 0.9 % IV SOLN: 500.0000 mL | INTRAVENOUS | Status: DC

## 2013-01-22 NOTE — Progress Notes (Signed)
Patient did not have preoperative order for IV antibiotic SSI prophylaxis. (G8918)  Patient did not experience any of the following events: a burn prior to discharge; a fall within the facility; wrong site/side/patient/procedure/implant event; or a hospital transfer or hospital admission upon discharge from the facility. (G8907)  

## 2013-01-22 NOTE — Patient Instructions (Signed)
YOU HAD AN ENDOSCOPIC PROCEDURE TODAY AT THE  ENDOSCOPY CENTER: Refer to the procedure report that was given to you for any specific questions about what was found during the examination.  If the procedure report does not answer your questions, please call your gastroenterologist to clarify.  If you requested that your care partner not be given the details of your procedure findings, then the procedure report has been included in a sealed envelope for you to review at your convenience later.  YOU SHOULD EXPECT: Some feelings of bloating in the abdomen. Passage of more gas than usual.  Walking can help get rid of the air that was put into your GI tract during the procedure and reduce the bloating. If you had a lower endoscopy (such as a colonoscopy or flexible sigmoidoscopy) you may notice spotting of blood in your stool or on the toilet paper. If you underwent a bowel prep for your procedure, then you may not have a normal bowel movement for a few days.  DIET: Your first meal following the procedure should be a light meal and then it is ok to progress to your normal diet.  A half-sandwich or bowl of soup is an example of a good first meal.  Heavy or fried foods are harder to digest and may make you feel nauseous or bloated.  Likewise meals heavy in dairy and vegetables can cause extra gas to form and this can also increase the bloating.  Drink plenty of fluids but you should avoid alcoholic beverages for 24 hours.  ACTIVITY: Your care partner should take you home directly after the procedure.  You should plan to take it easy, moving slowly for the rest of the day.  You can resume normal activity the day after the procedure however you should NOT DRIVE or use heavy machinery for 24 hours (because of the sedation medicines used during the test).    SYMPTOMS TO REPORT IMMEDIATELY: A gastroenterologist can be reached at any hour.  During normal business hours, 8:30 AM to 5:00 PM Monday through Friday,  call [phone].  After hours and on weekends, please call the GI answering service at [phone] who will take a message and have the physician on call contact you.   Following lower endoscopy (colonoscopy or flexible sigmoidoscopy):  Excessive amounts of blood in the stool  Significant tenderness or worsening of abdominal pains  Swelling of the abdomen that is new, acute  Fever of 100F or higher  FOLLOW UP: If any biopsies were taken you will be contacted by phone or by letter within the next 1-3 weeks.  Call your gastroenterologist if you have not heard about the biopsies in 3 weeks.  Our staff will call the home number listed on your records the next business day following your procedure to check on you and address any questions or concerns that you may have at that time regarding the information given to you following your procedure. This is a courtesy call and so if there is no answer at the home number and we have not heard from you through the emergency physician on call, we will assume that you have returned to your regular daily activities without incident.  SIGNATURES/CONFIDENTIALITY: You and/or your care partner have signed paperwork which will be entered into your electronic medical record.  These signatures attest to the fact that that the information above on your After Visit Summary has been reviewed and is understood.  Full responsibility of the confidentiality of this   discharge information lies with you and/or your care-partner.  Recommendations See procedure report  

## 2013-01-22 NOTE — Progress Notes (Signed)
Lidocaine-40mg IV prior to Propofol InductionPropofol given over incremental dosages 

## 2013-01-22 NOTE — Op Note (Signed)
[facility] [address] [place], [address]   COLONOSCOPY PROCEDURE REPORT  PATIENT: Christy Burke, Christy Burke  MR#: [id number] BIRTHDATE: [birth year] , 52  yrs. old GENDER: Female ENDOSCOPIST: Roxy Cedar, MD REFERRED AO:[id number] Dorise Hiss, M.D. PROCEDURE DATE:  01/22/2013 PROCEDURE:   Colonoscopy, screening First Screening Colonoscopy - Avg.  risk and is 50 yrs.  old or older Yes.  Prior Negative Screening - Now for repeat screening. N/A  History of Adenoma - Now for follow-up colonoscopy & has been > or = to 3 yrs.  N/A  Polyps Removed Today? No.  Recommend repeat exam, <10 yrs? No. ASA CLASS:   Class II INDICATIONS:average risk screening. MEDICATIONS: MAC sedation, administered by CRNA and propofol (Diprivan) 300mg  IV  DESCRIPTION OF PROCEDURE:   After the risks benefits and alternatives of the procedure were thoroughly explained, informed consent was obtained.  A digital rectal exam revealed no abnormalities of the rectum.   The LB [id number] and [id number]  endoscope was introduced through the anus and advanced to the cecum, which was identified by both the appendix and ileocecal valve. No adverse events experienced.   The quality of the prep was excellent, using MoviPrep  The instrument was then slowly withdrawn as the colon was fully examined.    COLON FINDINGS: A Diverticulum was noted at the cecum.   The colon was otherwise normal.  There was no additional diverticulosis, inflammation, polyps or cancers .  Retroflexed views revealed no abnormalities. The time to cecum=3 minutes 0 seconds.  Withdrawal time=12 minutes 25 seconds.  The scope was withdrawn and the procedure completed. COMPLICATIONS: There were no complications.  ENDOSCOPIC IMPRESSION: 1.   Diverticulum, cecum 2.   The colon was otherwise normal  RECOMMENDATIONS: 1. Continue current colorectal screening recommendations for "routine risk" patients with a repeat colonoscopy  in 10 years.   eSigned:  Roxy Cedar, MD 01/22/2013 12:22 PM   cc: The Patient    ; Gerre Couch

## 2013-01-23 ENCOUNTER — Telehealth: Payer: Self-pay | Admitting: *Deleted

## 2013-01-23 NOTE — Telephone Encounter (Signed)
No answer, left message to call if questions or concerns. 

## 2013-01-28 ENCOUNTER — Encounter (HOSPITAL_COMMUNITY): Payer: Self-pay | Admitting: Emergency Medicine

## 2013-01-28 ENCOUNTER — Emergency Department (INDEPENDENT_AMBULATORY_CARE_PROVIDER_SITE_OTHER)
Admission: EM | Admit: 2013-01-28 | Discharge: 2013-01-28 | Disposition: A | Discharge status: Home / Self Care | Payer: No Typology Code available for payment source | Source: Home / Self Care

## 2013-01-28 DIAGNOSIS — R0781 Pleurodynia: Secondary | ICD-10-CM

## 2013-01-28 DIAGNOSIS — R079 Chest pain, unspecified: Secondary | ICD-10-CM

## 2013-01-28 DIAGNOSIS — R071 Chest pain on breathing: Secondary | ICD-10-CM

## 2013-01-28 DIAGNOSIS — R0789 Other chest pain: Secondary | ICD-10-CM

## 2013-01-28 DIAGNOSIS — R319 Hematuria, unspecified: Secondary | ICD-10-CM

## 2013-01-28 LAB — POCT PREGNANCY, URINE: Preg Test, Ur: NEGATIVE

## 2013-01-28 LAB — POCT URINALYSIS DIP (DEVICE)
Bilirubin Urine: NEGATIVE
Glucose, UA: NEGATIVE mg/dL
Ketones, ur: NEGATIVE mg/dL
Nitrite: NEGATIVE
Urobilinogen, UA: 0.2 mg/dL (ref 0.0–1.0)
pH: 6.5 (ref 5.0–8.0)

## 2013-01-28 MED ORDER — TRAMADOL HCL 50 MG PO TABS: 50.0000 mg | ORAL_TABLET | Freq: Four times a day (QID) | ORAL | Status: DC | PRN

## 2013-01-28 MED ORDER — DICLOFENAC POTASSIUM 50 MG PO TABS: 50.0000 mg | ORAL_TABLET | Freq: Three times a day (TID) | ORAL | Status: DC

## 2013-01-28 MED ORDER — KETOROLAC TROMETHAMINE 60 MG/2ML IM SOLN
INTRAMUSCULAR | Status: AC
  Filled 2013-01-28: qty 2

## 2013-01-28 MED ORDER — KETOROLAC TROMETHAMINE 60 MG/2ML IM SOLN
60.0000 mg | Freq: Once | INTRAMUSCULAR | Status: AC
  Administered 2013-01-28: 60 mg via INTRAMUSCULAR

## 2013-01-28 NOTE — ED Provider Notes (Signed)
CSN: [id number]     Arrival date & time 01/28/13  1218 History   First MD Initiated Contact with Patient 01/28/13 1343     Chief Complaint  Patient presents with  . Flank Pain   (Consider location/radiation/quality/duration/timing/severity/associated sxs/prior Treatment) HPI Comments: 52 year old female presents with left low back pain for one month. Pain is located along the left posterior and lateral eighth and ninth ribs. It is worse when lying on the left side. It is intermittent, occurs approximately every other day. The intensity of pain waxes and wanes. She denies any known injury or blunt trauma. She has been evaluated by her physician and was given exercises to perform. She states that she does not need to do this but calls her work requires exercises and stretches prior to performing her job duties.  There is an incidental finding of hematuria. Patient states that she completed her menses approximately 5 days ago. She denies any urinary symptoms.   History reviewed. No pertinent past medical history. Past Surgical History  Procedure Laterality Date  . Umbilical hernia repair  1990   Family History  Problem Relation Age of Onset  . Cancer Mother   . Diabetes Father   . Colon cancer Neg Hx    History  Substance Use Topics  . Smoking status: Current Every Day Smoker -- 0.50 packs/day    Types: Cigarettes  . Smokeless tobacco: Never Used  . 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  HENT: Negative.   Respiratory: Negative.  Negative for chest tightness, shortness of breath and wheezing.   Cardiovascular: Negative.   Gastrointestinal: Negative.   Genitourinary: Negative.  Negative for dysuria, urgency, frequency, decreased urine volume, vaginal  bleeding, difficulty urinating, menstrual problem and pelvic pain.  Musculoskeletal:       As per HPI  Skin: Negative for color change, pallor and rash.  Neurological: Negative.     Allergies  Review of patient's allergies indicates no known allergies.  Home Medications   Current Outpatient Rx  Name  Route  Sig  Dispense  Refill  . Ibuprofen (MOTRIN IB PO)   Oral   Take by mouth.         . diclofenac (CATAFLAM) 50 MG tablet   Oral   Take 1 tablet (50 mg total) by mouth 3 (three) times daily.   21 tablet   0   . traMADol (ULTRAM) 50 MG tablet   Oral   Take 1 tablet (50 mg total) by mouth every 6 (six) hours as needed.   15 tablet   0    BP 127/88  Pulse 70  Temp(Src) 97.7 F (36.5 C) (Oral)  Resp 18  SpO2 96%  LMP 01/19/2013 Physical Exam  Nursing note and vitals reviewed. Constitutional: She is oriented to person, place, and time. She appears well-developed and well-nourished. No distress.  HENT:  Head: Normocephalic and atraumatic.  Eyes: EOM are normal.  Neck: Normal range of motion. Neck supple.  Pulmonary/Chest: Effort normal and breath sounds normal. No respiratory distress.  Abdominal: Soft. There is no tenderness.  No abdominal pain or tenderness.  Musculoskeletal: She exhibits tenderness. She exhibits no edema.  There is an  equivocal moderate to severe tenderness along the posterior left ribs proximally 7,8 and 9. There are 3 specific points which elicit the greatest pain. The pain is located laterally at the midaxillary line at the posterior axillary line and an area approximately 6 cm from the spine. No spinal tenderness. Patient states that this tenderness reproduces the exact same pain for which she has had for a month.  Neurological: She is alert and oriented to person, place, and time. No cranial nerve deficit. She exhibits normal muscle tone.  Skin: Skin is warm and dry. No rash noted.    ED Course  Procedures (including critical care  time) Labs Review Labs Reviewed  POCT URINALYSIS DIP (DEVICE) - Abnormal; Notable for the following:    Hgb urine dipstick LARGE (*)    Protein, ur 30 (*)    Leukocytes, UA SMALL (*)    All other components within normal limits  POCT PREGNANCY, URINE   Imaging Review No results found.  Results for orders placed during the hospital encounter of 01/28/13  POCT URINALYSIS DIP (DEVICE)      Result Value Range   Glucose, UA NEGATIVE  NEGATIVE mg/dL   Bilirubin Urine NEGATIVE  NEGATIVE   Ketones, ur NEGATIVE  NEGATIVE mg/dL   Specific Gravity, Urine >=1.030  1.005 - 1.030   Hgb urine dipstick LARGE (*) NEGATIVE   pH 6.5  5.0 - 8.0   Protein, ur 30 (*) NEGATIVE mg/dL   Urobilinogen, UA 0.2  0.0 - 1.0 mg/dL   Nitrite NEGATIVE  NEGATIVE   Leukocytes, UA SMALL (*) NEGATIVE  POCT PREGNANCY, URINE      Result Value Range   Preg Test, Ur NEGATIVE  NEGATIVE       MDM   1. Rib pain on left side   2. Hematuria   3. Chest wall pain    Patient declines x-ray of the ribs. She insists that there cannot possibly be a fracture or other problems since she has not had a known injury. Toradol 60 mg Im cataflam 50mg  tid prn Ultram 50 mg q 6h prn Call your doctor in2 d for appointment to rechk your urine for blood. May need a referral to urology. Recheck promptly for worsening new symptoms or problems.  Hayden Rasmussen, NP 01/28/13 [phone]

## 2013-01-28 NOTE — ED Provider Notes (Signed)
Medical screening examination/treatment/procedure(s) were performed by non-physician practitioner and as supervising physician I was immediately available for consultation/collaboration.  Celina Shiley, M.D.  Elfrida Pixley C Addalyne Vandehei, MD 01/28/13 1515 

## 2013-01-28 NOTE — ED Notes (Signed)
Pt  Reports  l  Flank  Pain         Which  She  Reports    Has been    Bothering  Her  For  About 1  Month    She  Reports  It has  Become  Worse  Last  Pm          denys  Any  specefic  Injury

## 2013-02-04 ENCOUNTER — Ambulatory Visit (INDEPENDENT_AMBULATORY_CARE_PROVIDER_SITE_OTHER): Payer: No Typology Code available for payment source | Admitting: Internal Medicine

## 2013-02-04 DIAGNOSIS — R319 Hematuria, unspecified: Secondary | ICD-10-CM

## 2013-02-04 DIAGNOSIS — R310 Gross hematuria: Secondary | ICD-10-CM

## 2013-02-04 LAB — POCT URINALYSIS DIPSTICK
Bilirubin, UA: NEGATIVE
Glucose, UA: NEGATIVE
Nitrite, UA: NEGATIVE
Protein, UA: 100
Urobilinogen, UA: 0.2
pH, UA: 6

## 2013-02-04 MED ORDER — HYDROCODONE-ACETAMINOPHEN 5-325 MG PO TABS: 1.0000 | ORAL_TABLET | Freq: Four times a day (QID) | ORAL | Status: DC | PRN

## 2013-02-04 NOTE — Progress Notes (Signed)
   Subjective:    Patient ID: Christy Burke, female    DOB: October 23, 1960, 52 y.o.   MRN: [id number]  HPI Christy Burke is a 52 yo F with no pmh presents acutely for gross hematuria. Pt describes ongoing left flank pain and dark urine that has not improved with NSAIDS, exercise, or tylenol. Pt describes pain as nagging, intermittent, still occassions of dark urine, no gross hematuria or PRBPR or in urine, no hx of chronic UTIs or stones, pt has not had any new sexual partners and female only partners.   pts meds, medical hx, and social hx all reviewed with patient.   Review of Systems  Constitutional: Negative for fever, chills and fatigue.  Respiratory: Negative for chest tightness and shortness of breath.   Cardiovascular: Negative for chest pain and palpitations.  Gastrointestinal: Negative for abdominal pain, diarrhea, constipation, blood in stool, anal bleeding and rectal pain.  Genitourinary: Positive for hematuria and flank pain. Negative for dysuria, frequency, decreased urine volume and vaginal bleeding.  Skin: Negative for rash.       Objective:   Physical Exam There were no vitals filed for this visit. General:sitting in chair, NAD HEENT: PERRL, EOMI, no scleral icterus Cardiac: RRR, no rubs, murmurs or gallops Pulm: clear to auscultation bilaterally, moving normal volumes of air Abd: soft, nontender, nondistended, BS present, CVAT on left side Ext: warm and well perfused, no pedal edema Neuro: alert and oriented X3, cranial nerves II-XII grossly intact        Assessment & Plan:  1. Gross hematuria: pt has concerning signs of possible RCC given smoking history and ongoing unexplained hematuria. Patient has tried physical therapy and multiple NSAIDs with little to no relief of left flank pain. Patient does not show any systemic signs or have symptoms of an infectious process and does not have a history of kidney stones personally or family history making it less likely. -renal  US -UA/Urine micro -urology referral  -Norco 5-325 for pain  Patient discussed with Dr. Criselda Peaches

## 2013-02-05 ENCOUNTER — Ambulatory Visit (HOSPITAL_COMMUNITY)
Admission: RE | Admit: 2013-02-05 | Discharge: 2013-02-05 | Disposition: A | Discharge status: Home / Self Care | Payer: No Typology Code available for payment source | Source: Ambulatory Visit | Attending: Internal Medicine | Admitting: Internal Medicine

## 2013-02-05 DIAGNOSIS — N200 Calculus of kidney: Secondary | ICD-10-CM | POA: Insufficient documentation

## 2013-02-05 DIAGNOSIS — R319 Hematuria, unspecified: Secondary | ICD-10-CM

## 2013-02-05 DIAGNOSIS — N1330 Unspecified hydronephrosis: Secondary | ICD-10-CM | POA: Insufficient documentation

## 2013-02-05 LAB — URINALYSIS, ROUTINE W REFLEX MICROSCOPIC
Nitrite: NEGATIVE
Protein, ur: 30 mg/dL — AB
Urobilinogen, UA: 0.2 mg/dL (ref 0.0–1.0)
pH: 6 (ref 5.0–8.0)

## 2013-02-05 LAB — URINALYSIS, MICROSCOPIC ONLY
Bacteria, UA: NONE SEEN
Casts: NONE SEEN

## 2013-02-05 NOTE — Progress Notes (Signed)
Case discussed with Dr. Sadek soon after the resident saw the patient.  We reviewed the resident's history and exam and pertinent patient test results.  I agree with the assessment, diagnosis, and plan of care documented in the resident's note. 

## 2013-02-07 ENCOUNTER — Telehealth: Payer: Self-pay | Admitting: Internal Medicine

## 2013-02-07 NOTE — Telephone Encounter (Signed)
Tried many times to come in contact with patient to inform of renal stone that is unlikely to pass given 1.8cm and some mild hydronephrosis. Needs to seek urological care. Will pass along to PCP and attempt again.

## 2013-02-10 ENCOUNTER — Telehealth: Payer: Self-pay | Admitting: *Deleted

## 2013-02-10 DIAGNOSIS — N200 Calculus of kidney: Secondary | ICD-10-CM

## 2013-02-10 NOTE — Telephone Encounter (Signed)
Referral placed in Epic, attempted to make appt at [facility], but they are "out of network" with pt's insurance plan. Will call pt's insurance and find out whose in-network.  Pt also wanted me to let Dr Burtis Junes know that the medication she was prescribed for pain is not working.  Will forward to Dr Burtis Junes for review, please advise.Christy Spittle Cassady12/9/20143:15 PM

## 2013-02-10 NOTE — Telephone Encounter (Signed)
Spoke w/ pt explained that she would need a urology referral, she understands and understands the process. Dr Burtis Junes will you please put in the referral if not please make a point to speak w/ dr Dorise Hiss so she can put it in, Roland. Stated she would follow through on the referral, pt understands it will be [facility]. Please call pt asap with appt or that it has been sent to urology to make appt

## 2013-02-12 NOTE — Addendum Note (Signed)
Addended by: Maura Crandall on: 02/12/2013 09:19 AM   Modules accepted: Orders

## 2013-02-17 ENCOUNTER — Telehealth: Payer: Self-pay | Admitting: *Deleted

## 2013-02-17 DIAGNOSIS — D219 Benign neoplasm of connective and other soft tissue, unspecified: Secondary | ICD-10-CM

## 2013-02-17 NOTE — Telephone Encounter (Signed)
Pt stated Urologist incidentally saw fibroids on her ultrasound and now requests gyn referral.  Order placed, will send to pcp for review/signature.Kingsley Spittle Cassady12/16/201410:46 AM

## 2013-02-18 NOTE — Telephone Encounter (Signed)
Agree with GYN referral.  Thanks,  Dr. Dorise Hiss

## 2013-04-10 ENCOUNTER — Encounter: Payer: No Typology Code available for payment source | Admitting: Obstetrics & Gynecology

## 2013-04-14 NOTE — Addendum Note (Signed)
Addended by: Hulan Fray on: 04/14/2013 06:21 PM   Modules accepted: Orders

## 2013-05-26 ENCOUNTER — Encounter: Payer: Self-pay | Admitting: Internal Medicine

## 2013-05-26 ENCOUNTER — Ambulatory Visit (INDEPENDENT_AMBULATORY_CARE_PROVIDER_SITE_OTHER): Payer: No Typology Code available for payment source | Admitting: Internal Medicine

## 2013-05-26 VITALS — BP 116/81 | HR 76 | Temp 98.2°F | Wt 185.2 lb

## 2013-05-26 DIAGNOSIS — F17200 Nicotine dependence, unspecified, uncomplicated: Secondary | ICD-10-CM

## 2013-05-26 DIAGNOSIS — Z1239 Encounter for other screening for malignant neoplasm of breast: Secondary | ICD-10-CM

## 2013-05-26 MED ORDER — HYDROCODONE-ACETAMINOPHEN 5-325 MG PO TABS: 0.5000 | ORAL_TABLET | Freq: Two times a day (BID) | ORAL | Status: DC | PRN

## 2013-05-26 NOTE — Patient Instructions (Addendum)
You have a very low risk of bone fracture. In order to reduce this risk, exercise is recommended about 3 days per week. Work on maintaining calcium in your diet. Things such as milk, yogurt, cheeses have calcium in your diet and we recommend about 3 servings per day of calcium containing foods. Vegetables and leafy greens also have calcium in them and are good to eat.    Come back in June to see your doctor, work on getting your mammogram.   Call us with problems or questions before then at [phone].

## 2013-05-26 NOTE — Assessment & Plan Note (Signed)
Patient has stopped and restarted smoking since last visit and knows that she needs to quit. We discussed the financial benefits to smoking cessation and bone health. She will quit before June. We also discussed walks to help reduce stress instead of smoking.

## 2013-05-26 NOTE — Progress Notes (Signed)
Case discussed with Dr. Doug Sou soon after the resident saw the patient.  We reviewed the resident's history and exam and pertinent patient test results.  I agree with the assessment, diagnosis and plan of care documented in the resident's note.  Please note that the Vicodin is for the chronic back pain.

## 2013-05-26 NOTE — Progress Notes (Signed)
Subjective:     Patient ID: Christy Burke, female   DOB: [birth year], 53 y.o.   MRN: [id number]  HPI The patient is a 53 year old female with past medical history of tobacco abuse who comes in today for follow up. She does not have any chest pain, shortness of breath, nausea, vomiting, diarrhea. She states that she does have some back discomfort after work at times. She does physical labor at work and states that she has been using tylenol and ibuprofen at home without much improvement. She was taking the vicodin for her kidney stone last fall and 1/2 -1 pill of that did help her back markedly. She has had recent health screening and wants to go over what those results mean.    Review of Systems  Constitutional: Negative for fever, chills, diaphoresis, activity change, appetite change, fatigue and unexpected weight change.  HENT: Negative.   Eyes: Negative.   Respiratory: Negative for cough, chest tightness, shortness of breath and wheezing.   Cardiovascular: Negative for chest pain, palpitations and leg swelling.  Gastrointestinal: Negative for nausea, vomiting, abdominal pain, diarrhea, constipation and blood in stool.  Endocrine: Negative.   Genitourinary: Negative.   Musculoskeletal: Negative for arthralgias, back pain, gait problem, joint swelling and myalgias.  Skin: Negative for color change, pallor, rash and wound.  Allergic/Immunologic: Negative.   Neurological: Negative for dizziness, tremors, seizures, syncope, facial asymmetry, speech difficulty, weakness, light-headedness, numbness and headaches.  Hematological: Negative.   Psychiatric/Behavioral: Negative.       Objective:   Physical Exam  Constitutional: She is oriented to person, place, and time. She appears well-developed and well-nourished.  Hair in deadlocks.  HENT:  Head: Normocephalic and atraumatic.  Eyes: EOM are normal. Pupils are equal, round, and reactive to light.  Neck: Normal range of motion. Neck supple. No  JVD present. No tracheal deviation present. No thyromegaly present.  Cardiovascular: Normal rate and regular rhythm.   Pulmonary/Chest: Effort normal and breath sounds normal. No respiratory distress. She has no wheezes. She has no rales.  Abdominal: Soft. Bowel sounds are normal. She exhibits no distension. There is no tenderness. There is no rebound.  Musculoskeletal: Normal range of motion. She exhibits no edema and no tenderness.  Neurological: She is alert and oriented to person, place, and time. No cranial nerve deficit. Coordination normal.  Skin: Skin is warm and dry. No rash noted. No erythema. No pallor.       Assessment/Plan   1. Please see problem oriented charting.  2 Disposition-Patient will be seen back in June. She does not wish flu shot at today's visit. She was advised that she should quit smoking and she was given information regarding smoking cessation tips. She will trial 1/2-1 pill bid prn vicodin #60 no refills. Will renew until June and decide if she needs contract or not at that time. Order for screening mammogram placed at today's visit.

## 2013-06-04 ENCOUNTER — Ambulatory Visit (INDEPENDENT_AMBULATORY_CARE_PROVIDER_SITE_OTHER): Payer: No Typology Code available for payment source | Admitting: Internal Medicine

## 2013-06-04 ENCOUNTER — Encounter: Payer: Self-pay | Admitting: Internal Medicine

## 2013-06-04 ENCOUNTER — Ambulatory Visit (HOSPITAL_COMMUNITY)
Admission: RE | Admit: 2013-06-04 | Discharge: 2013-06-04 | Disposition: A | Discharge status: Home / Self Care | Payer: No Typology Code available for payment source | Source: Ambulatory Visit | Attending: Internal Medicine | Admitting: Internal Medicine

## 2013-06-04 VITALS — BP 115/88 | HR 77 | Temp 97.9°F | Wt 185.1 lb

## 2013-06-04 DIAGNOSIS — M5137 Other intervertebral disc degeneration, lumbosacral region: Secondary | ICD-10-CM | POA: Insufficient documentation

## 2013-06-04 DIAGNOSIS — M546 Pain in thoracic spine: Secondary | ICD-10-CM | POA: Insufficient documentation

## 2013-06-04 DIAGNOSIS — M549 Dorsalgia, unspecified: Secondary | ICD-10-CM

## 2013-06-04 DIAGNOSIS — M545 Low back pain, unspecified: Secondary | ICD-10-CM | POA: Insufficient documentation

## 2013-06-04 MED ORDER — HYDROCODONE-ACETAMINOPHEN 5-325 MG PO TABS: 0.5000 | ORAL_TABLET | Freq: Two times a day (BID) | ORAL | Status: DC | PRN

## 2013-06-04 MED ORDER — MELOXICAM 15 MG PO TABS: 15.0000 mg | ORAL_TABLET | Freq: Every day | ORAL | Status: DC

## 2013-06-04 NOTE — Patient Instructions (Signed)
I have added a new medication for your back pain. Please take it as prescribed. Please decrease the amount of Vicodin to no more than 2 tablets per day. I have given you a prescription for 1 additional week of vicodin for this month.  Please f/u in 3 weeks.

## 2013-06-04 NOTE — Assessment & Plan Note (Signed)
A: Patients back pain is likely myofascial. I spoke with patients PCP regarding the case. PCP rec's adding alternative agents such as NSAIDs to the patients regimen as opposed to increasing narcotics.  P: Lumbar and thoracic x-rays. Maintain current prescription of 2 vicodin per day. Patient has ~ 2 weeks supply left. Will write prescrition for 1 weeks supply to hold her over until next months. Adding meloxicam to regimen. Will consider adding muscle relaxant at next visit.

## 2013-06-04 NOTE — Progress Notes (Signed)
Patient ID: Christy Burke, female   DOB: 18-Oct-1960, 53 y.o.   MRN: [id number]    Subjective:   Patient ID: Christy Burke female   DOB: [birth year] 53 y.o.   MRN: [id number]  HPI: [name] is a 53 y.o. woman with a pmhx of tobacco use who presents to the clinic with a cc of low back pain. She was seen 1 week ago by her PCP who evaluated her back pain and started the patient on 1 month supply of 2 Vicodin/day. The patient has been taking 4 per day and returns to clinic requesting an increase in her prescription. Her back pain is located in the midline and paraspinal area of her mid and low back. It is made worse with extension and lateral flexion. It is worse on the right side. She denies weakness and numbness. She recently was seen by her urologist for a history of kidney stones. She reports hematuria, which is being followed by her urologist. She states that this pain is very different that her kidney stone pain. She denies fevers, chills, nausea, vomiting, abdominal pain.    No past medical history on file. Current Outpatient Prescriptions  Medication Sig Dispense Refill  . HYDROcodone-acetaminophen (NORCO) 5-325 MG per tablet Take 0.5-1 tablets by mouth 2 (two) times daily as needed for moderate pain.  60 tablet  0   No current facility-administered medications for this visit.   Family History  Problem Relation Age of Onset  . Cancer Mother   . Diabetes Father   . Colon cancer Neg Hx    History   Social History  . Marital Status: Single    Spouse Name: N/A    Number of Children: N/A  . Years of Education: N/A   Social History Main Topics  . Smoking status: Current Every Day Smoker -- 1.00 packs/day    Types: Cigarettes  . Smokeless tobacco: Never Used  . 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 Drug Use: No     Comment: Quit several years ago, never did IVDU only smoked cocaine  . Sexual Activity: Yes   Comment: In same sex relationship for 19 years   Other Topics Concern  . None   Social History Narrative  . None   Review of Systems: Pertinent items are noted in HPI. Objective:  Physical Exam: Filed Vitals:   06/04/13 0834  BP: 115/88  Pulse: 77  Temp: 97.9 F (36.6 C)  TempSrc: Oral  Weight: 185 lb 1.6 oz (83.961 kg)  SpO2: 96%   Physical Exam  Constitutional: She appears well-developed and well-nourished.  HENT:  Head: Normocephalic.  Mouth/Throat: Oropharynx is clear and moist. No oropharyngeal exudate.  Cardiovascular: Normal rate, regular rhythm, normal heart sounds and intact distal pulses.  Exam reveals no gallop and no friction rub.   No murmur heard. Pulmonary/Chest: Effort normal and breath sounds normal. No respiratory distress. She has no wheezes. She has no rales.  Abdominal: Soft. Bowel sounds are normal. She exhibits no distension. There is no tenderness. There is no rebound and no guarding.  Musculoskeletal:       Back:  Tender to palpation in the paraspinal muscles with increased tone. Positive facet loading test on the right side. Mild midline tenderness. Full flexion with minimal discomfort. No LE weakness of numbness.     Assessment & Plan:

## 2013-06-10 NOTE — Progress Notes (Signed)
Case discussed with Dr. Komanski at the time of the visit.  We reviewed the resident's history and exam and pertinent patient test results.  I agree with the assessment, diagnosis, and plan of care documented in the resident's note.      

## 2013-06-11 ENCOUNTER — Other Ambulatory Visit: Payer: Self-pay | Admitting: Internal Medicine

## 2013-06-11 ENCOUNTER — Ambulatory Visit (HOSPITAL_COMMUNITY)
Admission: RE | Admit: 2013-06-11 | Discharge: 2013-06-11 | Disposition: A | Discharge status: Home / Self Care | Payer: No Typology Code available for payment source | Source: Ambulatory Visit | Attending: Internal Medicine | Admitting: Internal Medicine

## 2013-06-11 DIAGNOSIS — Z1231 Encounter for screening mammogram for malignant neoplasm of breast: Secondary | ICD-10-CM | POA: Insufficient documentation

## 2013-06-11 DIAGNOSIS — Z1239 Encounter for other screening for malignant neoplasm of breast: Secondary | ICD-10-CM

## 2013-07-31 ENCOUNTER — Encounter: Payer: Self-pay | Admitting: Internal Medicine

## 2013-07-31 ENCOUNTER — Ambulatory Visit (INDEPENDENT_AMBULATORY_CARE_PROVIDER_SITE_OTHER): Payer: No Typology Code available for payment source | Admitting: Internal Medicine

## 2013-07-31 VITALS — BP 127/92 | HR 75 | Temp 97.9°F | Ht 66.0 in | Wt 184.5 lb

## 2013-07-31 DIAGNOSIS — M549 Dorsalgia, unspecified: Secondary | ICD-10-CM

## 2013-07-31 DIAGNOSIS — F17200 Nicotine dependence, unspecified, uncomplicated: Secondary | ICD-10-CM

## 2013-07-31 MED ORDER — HYDROCODONE-ACETAMINOPHEN 7.5-325 MG PO TABS: 1.0000 | ORAL_TABLET | Freq: Two times a day (BID) | ORAL | Status: DC | PRN

## 2013-07-31 NOTE — Assessment & Plan Note (Signed)
  Assessment: Progress toward smoking cessation:  smoking less Barriers to progress toward smoking cessation:  lack of motivation to quit Comments:   Plan: Instruction/counseling given:  I counseled patient on the dangers of tobacco use, advised patient to stop smoking, and reviewed strategies to maximize success. Educational resources provided:  [employer] account [employer]) brochure (Simultaneous filing. User may not have seen previous data.) Self management tools provided:    Medications to assist with smoking cessation:  None Patient agreed to the following self-care plans for smoking cessation:    Other plans:

## 2013-07-31 NOTE — Progress Notes (Signed)
Subjective:     Patient ID: Christy Burke, female   DOB: 09-21-1960, 53 y.o.   MRN: [id number]  HPI The patient is a 53 year old female with past medical history of tobacco abuse who comes in today for follow up. 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 She does not have any chest pain, shortness of breath, nausea, vomiting, diarrhea. She is working on cutting back on her cigarettes but does not wish to quit completely at this time. She has been helped by the vicodin and does not take it everyday but when she uses it takes 2 pills. The meloxicam has not helped.   Review of Systems  Constitutional: Negative for fever, chills, diaphoresis, activity change, appetite change, fatigue and unexpected weight change.  HENT: Negative.   Eyes: Negative.   Respiratory: Negative for cough, chest tightness, shortness of breath and wheezing.   Cardiovascular: Negative for chest pain, palpitations and leg swelling.  Gastrointestinal: Negative for nausea, vomiting, abdominal pain, diarrhea, constipation and blood in stool.  Endocrine: Negative.   Genitourinary: Negative.   Musculoskeletal: Positive for back pain. Negative for arthralgias, gait problem, joint swelling and myalgias.  Skin: Negative for color change, pallor, rash and wound.  Allergic/Immunologic: Negative.   Neurological: Negative for dizziness, tremors, seizures, syncope, facial asymmetry, speech difficulty, weakness, light-headedness, numbness and headaches.  Hematological: Negative.   Psychiatric/Behavioral: Negative.       Objective:   Physical Exam  Constitutional: She is oriented to person, place, and time. She appears well-developed and well-nourished.  Hair in deadlocks.  HENT:  Head: Normocephalic and atraumatic.  Eyes: EOM are normal. Pupils are equal, round, and reactive to light.  Neck: Normal range of motion. Neck supple. No JVD present. No tracheal deviation present. No  thyromegaly present.  Cardiovascular: Normal rate and regular rhythm.   Pulmonary/Chest: Effort normal and breath sounds normal. No respiratory distress. She has no wheezes. She has no rales.  Abdominal: Soft. Bowel sounds are normal. She exhibits no distension. There is no tenderness. There is no rebound.  Musculoskeletal: Normal range of motion. She exhibits tenderness. She exhibits no edema.  Lumbar pain paraspinally. No radiation to legs or buttock and no pain over the spinal cord.  Neurological: She is alert and oriented to person, place, and time. No cranial nerve deficit. Coordination normal.  Skin: Skin is warm and dry. No rash noted. No erythema. No pallor.       Assessment/Plan   1. Please see problem oriented charting.  2 Disposition-Patient will be seen back in 3-6 months. Signed pain contract for vicodin 7.5/325 # 60 per month to help with work related back pain. She was advised that she should quit smoking and she was given information regarding smoking cessation tips. Will print off 2 extra scripts for her to be kept until renewed. No red flag signs that might indicate need for further imaging. If pain continues would consider CT lumbar in next 3-4 months.

## 2013-07-31 NOTE — Assessment & Plan Note (Signed)
Signed pain contract with patient today for vicodin 7.5/325 mg 1 tab bid prn pain # 60 per month. One script given to patient and 2 more printed for disbursement at later date. Was unable to place FYI flag as it would not launch on epic. Will try again at later date. Copy of pain contract given to patient.

## 2013-07-31 NOTE — Patient Instructions (Signed)
We will see you back in about 3-6 months. If your back is hurting worse please come in around 3 months, if it is doing better you can wait until 6 months.   If you have any questions or problems please call us at [phone].

## 2013-08-03 NOTE — Progress Notes (Signed)
INTERNAL MEDICINE TEACHING ATTENDING ADDENDUM - Aldine Contes, MD: I reviewed and discussed at the time of visit with the resident Dr. Doug Sou, the patient's medical history, physical examination, diagnosis and results of tests and treatment and I agree with the patient's care as documented.

## 2013-08-07 ENCOUNTER — Other Ambulatory Visit: Payer: Self-pay | Admitting: Internal Medicine

## 2013-08-07 MED ORDER — HYDROCODONE-ACETAMINOPHEN 7.5-325 MG PO TABS: 1.0000 | ORAL_TABLET | Freq: Two times a day (BID) | ORAL | Status: DC | PRN

## 2013-08-14 ENCOUNTER — Encounter: Payer: No Typology Code available for payment source | Admitting: Internal Medicine

## 2013-08-26 ENCOUNTER — Other Ambulatory Visit: Payer: Self-pay | Admitting: *Deleted

## 2013-08-26 NOTE — Telephone Encounter (Signed)
Dr Doug Sou printed off 3 scripts on 07/31/13. Only one was given to pt per her note. Are the others in the file?

## 2013-08-28 ENCOUNTER — Encounter: Payer: No Typology Code available for payment source | Admitting: Internal Medicine

## 2013-09-22 ENCOUNTER — Other Ambulatory Visit: Payer: Self-pay | Admitting: *Deleted

## 2013-09-22 ENCOUNTER — Telehealth: Payer: Self-pay | Admitting: *Deleted

## 2013-09-22 NOTE — Telephone Encounter (Signed)
Dr Doug Sou gave three paper scripts on 07/31/13. Should still have the third Rx

## 2013-09-22 NOTE — Telephone Encounter (Signed)
Spoke with Christy Burke re: patient.  She should be given warning symptoms for anemia, including SOB, CP, dizziness, lightheadedness, etc which should prompt being seen earlier than Thursday.

## 2013-09-22 NOTE — Telephone Encounter (Signed)
Pt called stating she had her cycle on July 4 - 10, this was normal for her.  Then  After 6 days cycle returned on 17th until present. She uses 2 pads every 2 hours, they are not saturated but she always  Uses 2 pads at work so she feels safe.     No clots and no c/o dizziness.   She is going to work today but is asking if this is normal. She is ca/o headaches.  No other problems. She does not have a GYN doctor.   I advised visit to [facility] for evaluations.  She said she would go but not until Thursday or Friday.   Please advise - should she wait that long?

## 2013-09-22 NOTE — Telephone Encounter (Signed)
Pt called and has been educated on signs of anemia and will go to ED if there are changes. She will go to [facility] for f/u on vaginal bleeding.

## 2013-10-28 ENCOUNTER — Other Ambulatory Visit: Payer: Self-pay | Admitting: *Deleted

## 2013-10-28 MED ORDER — HYDROCODONE-ACETAMINOPHEN 7.5-325 MG PO TABS: 1.0000 | ORAL_TABLET | Freq: Two times a day (BID) | ORAL | Status: DC | PRN

## 2013-10-28 NOTE — Telephone Encounter (Signed)
Last refill 7/28 Call pt when ready @ [phone]

## 2013-11-19 ENCOUNTER — Encounter: Payer: Self-pay | Admitting: Internal Medicine

## 2013-11-19 ENCOUNTER — Ambulatory Visit (INDEPENDENT_AMBULATORY_CARE_PROVIDER_SITE_OTHER): Payer: No Typology Code available for payment source | Admitting: Internal Medicine

## 2013-11-19 VITALS — BP 123/75 | HR 84 | Temp 98.2°F | Ht 65.0 in | Wt 184.4 lb

## 2013-11-19 DIAGNOSIS — J069 Acute upper respiratory infection, unspecified: Secondary | ICD-10-CM

## 2013-11-19 MED ORDER — BENZONATATE 100 MG PO CAPS: 100.0000 mg | ORAL_CAPSULE | Freq: Four times a day (QID) | ORAL | Status: DC | PRN

## 2013-11-19 NOTE — Progress Notes (Signed)
INTERNAL MEDICINE TEACHING ATTENDING ADDENDUM - Christy Contes, MD: I personally saw and evaluated [name] in this clinic visit in conjunction with the resident, Dr. Sherrine Burke. I have discussed patient's plan of care with medical resident during this visit. I have confirmed the physical exam findings and have read and agree with the clinic note including the plan with the following addition: - Pt here for persistent cough * 8 days - Symptoms now resolving. Likely viral URI - No abx for now. Would give tessalon for cough - If no improvement in 5-6 days pt to inform us and we will initiate abx

## 2013-11-19 NOTE — Addendum Note (Signed)
Addended by: Drucilla Schmidt E on: 11/19/2013 04:39 PM   Modules accepted: Level of Service

## 2013-11-19 NOTE — Progress Notes (Signed)
Subjective:     Patient ID: Christy Burke, female   DOB: [birth year], 53 y.o.   MRN: [id number]  HPI Christy Burke is a 53 yo woman with a PMH of chronic back pain and tobacco use who is here for evaluation of a cough. She first noticed her symptoms 8 days ago. They consisted of a cough productive of white sputum (more at night), headache and occasional nausea and dizziness. She may have had some chills but did not take her temperature. She took mucinex, ibuprofen, robitussin and her previously prescribed norco. She thinks these medications may have worsened her headache. Today, only the cough and sputum production persist.  Review of Systems General: see HPI, otherwise well Skin: no rashes or lesions HEENT: see HPI Cardiac: no chest pain or palpitations Respiratory: no difficulty breathing GI: no changes in BMs, some nausea, no vomiting Urinary: no changes Msk: some aches, chronic back pain Psychiatric: no history of anxiety or depression      Objective:   Physical Exam Appearance: in NAD, sitting in exam room, easily jumps up to examining table HEENT:[place]/AT, PERRL, EOMi and no conjunctival injection, MMM, no pharyngeal erythema or exudate, no lymphadenopathy Heart: RRR, normal S1S2 Lungs: diffusely increased breath sounds, CTAB Abdomen: soft, nontender Extremities: no edema Neurologic: grossly intact Skin: no rashes, skin nodule on left cheek     Assessment:     Christy Burke is a 53 yo woman here with a URI. It appears to have improved since its onset.    Plan:     URI: - supportive therapy (liquids and rest) - tessalon for symptoms - return or call in 5-6 days and will initiate abx if symptoms have not improved

## 2013-11-19 NOTE — Patient Instructions (Signed)
Take the tessalon as directed for your symptoms.   If you don't feel better in 5-6 days, call the clinic and we can try some antibiotics.  Get plenty of fluids and rest.

## 2013-11-30 ENCOUNTER — Telehealth: Payer: Self-pay | Admitting: *Deleted

## 2013-11-30 ENCOUNTER — Other Ambulatory Visit: Payer: Self-pay | Admitting: *Deleted

## 2013-11-30 DIAGNOSIS — M545 Low back pain: Principal | ICD-10-CM

## 2013-11-30 DIAGNOSIS — G8929 Other chronic pain: Secondary | ICD-10-CM

## 2013-11-30 NOTE — Telephone Encounter (Signed)
Has had a clear productive cough since 11/12/13 - Tessalon perles not helping. Appt made 12/01/13 3:15PM Dr Denton Brick. Hilda Blades Enoc Getter RN 11/30/13 2:30PM

## 2013-12-01 ENCOUNTER — Ambulatory Visit (INDEPENDENT_AMBULATORY_CARE_PROVIDER_SITE_OTHER): Payer: No Typology Code available for payment source | Admitting: Internal Medicine

## 2013-12-01 ENCOUNTER — Encounter: Payer: Self-pay | Admitting: Internal Medicine

## 2013-12-01 VITALS — BP 118/80 | HR 72 | Temp 98.0°F | Ht 60.0 in | Wt 181.3 lb

## 2013-12-01 DIAGNOSIS — J069 Acute upper respiratory infection, unspecified: Secondary | ICD-10-CM

## 2013-12-01 MED ORDER — BENZONATATE 100 MG PO CAPS: 200.0000 mg | ORAL_CAPSULE | Freq: Three times a day (TID) | ORAL | Status: DC | PRN

## 2013-12-01 NOTE — Patient Instructions (Signed)
I have prescribed the tessalon again, you can take up to 200mg  three times a day.  If you develop shortness of breath, worsening cough or fever, you should let us know.  Think about quitting smoking and decide on a date by your next appointment. If you need nicotine patches let us know.

## 2013-12-01 NOTE — Progress Notes (Signed)
Patient ID: Christy Burke, female   DOB: 08/23/1960, 53 y.o.   MRN: [id number]   Subjective:   Patient ID: Christy Burke female   DOB: 03-27-1960 53 y.o.   MRN: [id number]  HPI: Christy Burke is a 53 y.o. with PMH listed below, presented today for follow up of cough, started on the 10th. Was here on the 17th of this month-12 days ago for same complaints. Pt still having cough today. 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 Pt denies fever, SOB, chest pain. Tessalon perrles help. Which pt has been taking. Cough is worse in th evening. Pt is still smoking cigarrtes, and is thinking of quiting but not quite ready. Been smoking since she was 15 years.   No past medical history on file. Current Outpatient Prescriptions  Medication Sig Dispense Refill  . benzonatate (TESSALON PERLES) 100 MG capsule Take 1 capsule (100 mg total) by mouth every 6 (six) hours as needed for cough.  30 capsule  1  . HYDROcodone-acetaminophen (NORCO) 7.5-325 MG per tablet Take 1 tablet by mouth 2 (two) times daily as needed for moderate pain.  60 tablet  0   No current facility-administered medications for this visit.   Family History  Problem Relation Age of Onset  . Cancer Mother   . Diabetes Father   . Colon cancer Neg Hx    History   Social History  . Marital Status: Single    Spouse Name: N/A    Number of Children: N/A  . Years of Education: N/A   Social History Main Topics  . Smoking status: Current Every Day Smoker -- 1.00 packs/day    Types: Cigarettes  . Smokeless tobacco: Never Used     Comment: CUTTING BACK PACK LAST 2 DAYS  . 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 Drug Use: No     Comment: Quit several years ago, never did IVDU only smoked cocaine  . Sexual Activity: Yes     Comment: In same sex relationship for 19 years   Other Topics Concern  . None   Social  History Narrative  . None   Review of Systems: CONSTITUTIONAL- No Fever, weightloss, night sweat or change in appetite. SKIN- No Rash, colour changes or itching. HEAD- No Headache or dizziness. Mouth/throat- No Sorethroat, dentures, or bleeding gums. RESPIRATORY- Has Cough but no SOB. CARDIAC- No Palpitations, DOE, PND or chest pain. GI- No nausea, vomiting, diarrhoea, constipation, abd pain. URINARY- No Frequency, urgency, straining or dysuria. NEUROLOGIC- No Numbness, syncope, seizures or burning. [facility]- Denies depression or anxiety.  Objective:  Physical Exam: Filed Vitals:   12/01/13 1500  BP: 118/80  Pulse: 72  Temp: 98 F (36.7 C)  TempSrc: Oral  Height: 5' (1.524 m)  Weight: 181 lb 4.8 oz (82.237 kg)  SpO2: 100%   GENERAL- alert, co-operative, appears as stated age, not in any distress. HEENT- Atraumatic, normocephalic, PERRL, EOMI, oral mucosa appears moist,  no cervical LN enlargement, thyroid does not appear enlarged, neck supple. CARDIAC- RRR, no murmurs, rubs or gallops. RESP- Moving equal volumes of air, and clear to auscultation bilaterally, no wheezes or crackles. ABDOMEN- Soft, nontender, no guarding or rebound, no palpable masses or organomegaly, bowel sounds present. NEURO- No obvious Cr N abnormality, strenght upper and lower extremities- intact, Gait- Normal. EXTREMITIES- pulse 2+, symmetric, no pedal edema.  SKIN- Warm, dry, No rash or lesion. PSYCH- Normal mood and affect, appropriate thought content and speech.  Assessment & Plan:   The patient's case and plan of care was discussed with attending physician, Dr. Dareen Piano.  Pt presented today with persistent cough- Still productive of creamy sputum, No sign of systemic involvement of worsening. No SOB. Pt previously used to cough prior to acute illness. Will continue conservative management for now. No previous diagnosis of COPD, no concern for that at the moment as chest is clear to ascultation, no  reported SOB- normal work of breathing. Longer period to recover most likely because pt is a smoker. - Increased tessalon perrles to 200mg  TID. - Pt cautioned about warning signs of worsening infection.  - Information given about smoking cessation.  - Declined Flu shot today.

## 2013-12-01 NOTE — Progress Notes (Signed)
INTERNAL MEDICINE TEACHING ATTENDING ADDENDUM - Jemina Scahill, MD: I reviewed and discussed at the time of visit with the resident Dr. Emokpae, the patient's medical history, physical examination, diagnosis and results of pertinent tests and treatment and I agree with the patient's care as documented.  

## 2013-12-03 MED ORDER — HYDROCODONE-ACETAMINOPHEN 7.5-325 MG PO TABS: 1.0000 | ORAL_TABLET | Freq: Two times a day (BID) | ORAL | Status: DC | PRN

## 2013-12-04 NOTE — Telephone Encounter (Signed)
Pt aware 12/03/13.

## 2013-12-28 ENCOUNTER — Other Ambulatory Visit: Payer: Self-pay | Admitting: *Deleted

## 2013-12-28 DIAGNOSIS — M545 Low back pain, unspecified: Secondary | ICD-10-CM

## 2013-12-28 DIAGNOSIS — G8929 Other chronic pain: Secondary | ICD-10-CM

## 2014-01-01 MED ORDER — HYDROCODONE-ACETAMINOPHEN 7.5-325 MG PO TABS: 1.0000 | ORAL_TABLET | Freq: Two times a day (BID) | ORAL | Status: DC | PRN

## 2014-01-04 NOTE — Telephone Encounter (Signed)
Pt aware.

## 2014-02-01 ENCOUNTER — Other Ambulatory Visit: Payer: Self-pay | Admitting: *Deleted

## 2014-02-01 DIAGNOSIS — M545 Low back pain, unspecified: Secondary | ICD-10-CM

## 2014-02-01 DIAGNOSIS — G8929 Other chronic pain: Secondary | ICD-10-CM

## 2014-02-03 MED ORDER — BENZONATATE 100 MG PO CAPS: 200.0000 mg | ORAL_CAPSULE | Freq: Three times a day (TID) | ORAL | Status: DC | PRN

## 2014-02-03 NOTE — Telephone Encounter (Signed)
Rx called in to pharmacy. Talked with pt about making appt per Dr Sherrine Maples regarding back pain.

## 2014-02-04 ENCOUNTER — Encounter: Payer: Self-pay | Admitting: Internal Medicine

## 2014-02-04 ENCOUNTER — Encounter: Payer: No Typology Code available for payment source | Admitting: Internal Medicine

## 2014-02-04 ENCOUNTER — Ambulatory Visit (INDEPENDENT_AMBULATORY_CARE_PROVIDER_SITE_OTHER): Payer: No Typology Code available for payment source | Admitting: Internal Medicine

## 2014-02-04 VITALS — BP 123/77 | HR 83 | Temp 98.6°F | Ht 65.0 in | Wt 184.9 lb

## 2014-02-04 DIAGNOSIS — S99922A Unspecified injury of left foot, initial encounter: Secondary | ICD-10-CM | POA: Insufficient documentation

## 2014-02-04 DIAGNOSIS — G8929 Other chronic pain: Secondary | ICD-10-CM

## 2014-02-04 DIAGNOSIS — M79661 Pain in right lower leg: Secondary | ICD-10-CM | POA: Insufficient documentation

## 2014-02-04 DIAGNOSIS — M545 Low back pain: Secondary | ICD-10-CM

## 2014-02-04 DIAGNOSIS — F17200 Nicotine dependence, unspecified, uncomplicated: Secondary | ICD-10-CM

## 2014-02-04 DIAGNOSIS — Z720 Tobacco use: Secondary | ICD-10-CM

## 2014-02-04 MED ORDER — HYDROCODONE-ACETAMINOPHEN 7.5-325 MG PO TABS: 1.0000 | ORAL_TABLET | Freq: Two times a day (BID) | ORAL | Status: DC | PRN

## 2014-02-04 NOTE — Patient Instructions (Signed)
General Instructions:  Please bring your medicines with you each time you come to clinic.  Medicines may include prescription medications, over-the-counter medications, herbal remedies, eye drops, vitamins, or other pills.  Congratulations on quitting smoking this weekend. We are looking forward to hearing of your success!  If your calf pain worsens or your toe pain persists, please call the clinic and we can re-evaluate. Otherwise, please make an appointment with Dr. Sherrine Maples as needed.  Smoking Cessation Quitting smoking is important to your health and has many advantages. However, it is not always easy to quit since nicotine is a very addictive drug. Oftentimes, people try 3 times or more before being able to quit. This document explains the best ways for you to prepare to quit smoking. Quitting takes hard work and a lot of effort, but you can do it. ADVANTAGES OF QUITTING SMOKING  You will live longer, feel better, and live better.  Your body will feel the impact of quitting smoking almost immediately.  Within 20 minutes, blood pressure decreases. Your pulse returns to its normal level.  After 8 hours, carbon monoxide levels in the blood return to normal. Your oxygen level increases.  After 24 hours, the chance of having a heart attack starts to decrease. Your breath, hair, and body stop smelling like smoke.  After 48 hours, damaged nerve endings begin to recover. Your sense of taste and smell improve.  After 72 hours, the body is virtually free of nicotine. Your bronchial tubes relax and breathing becomes easier.  After 2 to 12 weeks, lungs can hold more air. Exercise becomes easier and circulation improves.  The risk of having a heart attack, stroke, cancer, or lung disease is greatly reduced.  After 1 year, the risk of coronary heart disease is cut in half.  After 5 years, the risk of stroke falls to the same as a nonsmoker.  After 10 years, the risk of lung cancer is cut in  half and the risk of other cancers decreases significantly.  After 15 years, the risk of coronary heart disease drops, usually to the level of a nonsmoker.  If you are pregnant, quitting smoking will improve your chances of having a healthy baby.  The people you live with, especially any children, will be healthier.  You will have extra money to spend on things other than cigarettes. QUESTIONS TO THINK ABOUT BEFORE ATTEMPTING TO QUIT You may want to talk about your answers with your health care provider.  Why do you want to quit?  If you tried to quit in the past, what helped and what did not?  What will be the most difficult situations for you after you quit? How will you plan to handle them?  Who can help you through the tough times? Your family? Friends? A health care provider?  What pleasures do you get from smoking? What ways can you still get pleasure if you quit? Here are some questions to ask your health care provider:  How can you help me to be successful at quitting?  What medicine do you think would be best for me and how should I take it?  What should I do if I need more help?  What is smoking withdrawal like? How can I get information on withdrawal? GET READY  Set a quit date.  Change your environment by getting rid of all cigarettes, ashtrays, matches, and lighters in your home, car, or work. Do not let people smoke in your home.  Review your past  attempts to quit. Think about what worked and what did not. GET SUPPORT AND ENCOURAGEMENT You have a better chance of being successful if you have help. You can get support in many ways.  Tell your family, friends, and coworkers that you are going to quit and need their support. Ask them not to smoke around you.  Get individual, group, or telephone counseling and support. Programs are available at [employer] and health centers. Call your local health department for information about programs in your  area.  Spiritual beliefs and practices may help some smokers quit.  Download a "quit meter" on your computer to keep track of quit statistics, such as how long you have gone without smoking, cigarettes not smoked, and money saved.  Get a self-help book about quitting smoking and staying off tobacco. [place] yourself from urges to smoke. Talk to someone, go for a walk, or occupy your time with a task.  Change your normal routine. Take a different route to work. Drink tea instead of coffee. Eat breakfast in a different place.  Reduce your stress. Take a hot bath, exercise, or read a book.  Plan something enjoyable to do every day. Reward yourself for not smoking.  Explore interactive web-based programs that specialize in helping you quit. GET MEDICINE AND USE IT CORRECTLY Medicines can help you stop smoking and decrease the urge to smoke. Combining medicine with the above behavioral methods and support can greatly increase your chances of successfully quitting smoking.  Nicotine replacement therapy helps deliver nicotine to your body without the negative effects and risks of smoking. Nicotine replacement therapy includes nicotine gum, lozenges, inhalers, nasal sprays, and skin patches. Some may be available over-the-counter and others require a prescription.  Antidepressant medicine helps people abstain from smoking, but how this works is unknown. This medicine is available by prescription.  Nicotinic receptor partial agonist medicine simulates the effect of nicotine in your brain. This medicine is available by prescription. Ask your health care provider for advice about which medicines to use and how to use them based on your health history. Your health care provider will tell you what side effects to look out for if you choose to be on a medicine or therapy. Carefully read the information on the package. Do not use any other product containing nicotine while  using a nicotine replacement product.  RELAPSE OR DIFFICULT SITUATIONS Most relapses occur within the first 3 months after quitting. Do not be discouraged if you start smoking again. Remember, most people try several times before finally quitting. You may have symptoms of withdrawal because your body is used to nicotine. You may crave cigarettes, be irritable, feel very hungry, cough often, get headaches, or have difficulty concentrating. The withdrawal symptoms are only temporary. They are strongest when you first quit, but they will go away within 10-14 days. To reduce the chances of relapse, try to:  Avoid drinking alcohol. Drinking lowers your chances of successfully quitting.  Reduce the amount of caffeine you consume. Once you quit smoking, the amount of caffeine in your body increases and can give you symptoms, such as a rapid heartbeat, sweating, and anxiety.  Avoid smokers because they can make you want to smoke.  Do not let weight gain distract you. Many smokers will gain weight when they quit, usually less than 10 pounds. Eat a healthy diet and stay active. You can always lose the weight gained after you quit.  Find ways to improve  your mood other than smoking. FOR MORE INFORMATION  www.smokefree.gov  Document Released: 02/13/2001 Document Revised: 07/06/2013 Document Reviewed: 05/31/2011 [facility] Patient Information 2015 [place], [place]. This information is not intended to replace advice given to you by your health care provider. Make sure you discuss any questions you have with your health care provider.

## 2014-02-04 NOTE — Assessment & Plan Note (Addendum)
  Assessment: Progress toward smoking cessation:   Progressing Barriers to progress toward smoking cessation:   Partner smokes Comments: Plans to quit cold [place] on Saturday  Plan: Instruction/counseling given:  I counseled patient on the dangers of tobacco use, advised patient to stop smoking, and reviewed strategies to maximize success. Educational resources provided:   None Self management tools provided:   None Medications to assist with smoking cessation:  None Patient agreed to the following self-care plans for smoking cessation:   Patient plans to quit this Saturday. If unable to do it on her own, she would like to make an appointment with Dr. Sherrine Maples to discuss Nicorette Gum. Other plans:

## 2014-02-04 NOTE — Progress Notes (Signed)
   Subjective:    Patient ID: Christy Burke, female    DOB: Feb 12, 1961, 53 y.o.   MRN: [id number]  HPI Christy Burke is a 52 yo female with PMHx of tobacco abuse and low back pain who presents to the clinic for follow up. Please see problem oriented charting for further details.   Review of Systems Respiratory: Denies SOB, cough, chest tightness, and wheezing.   Cardiovascular: Denies chest pain and palpitations.  Gastrointestinal: Denies nausea, vomiting.  Musculoskeletal: Admits to myalgias (in right calf and left great toe), back pain (chronic).  Current Outpatient Prescriptions on File Prior to Visit  Medication Sig Dispense Refill  . benzonatate (TESSALON PERLES) 100 MG capsule Take 2 capsules (200 mg total) by mouth 3 (three) times daily as needed for cough. 30 capsule 0  . HYDROcodone-acetaminophen (NORCO) 7.5-325 MG per tablet Take 1 tablet by mouth 2 (two) times daily as needed for moderate pain. 60 tablet 0   No current facility-administered medications on file prior to visit.      Objective:   Physical Exam Filed Vitals:   02/04/14 0956  BP: 123/77  Pulse: 83  Temp: 98.6 F (37 C)  TempSrc: Oral  Height: 5\' 5"  (1.651 m)  Weight: 184 lb 14.4 oz (83.87 kg)  SpO2: 100%   General: Vital signs reviewed.  Patient is well-developed and well-nourished, in no acute distress and cooperative with exam.  Cardiovascular: RRR, S1 normal, S2 normal, no murmurs, gallops, or rubs. Pulmonary/Chest: Clear to auscultation bilaterally, no wheezes, rales, or rhonchi. Abdominal: Soft, non-tender.  Musculoskeletal: Normal sensation throughout, 2/4 DTR in achilles and patellar tendons, 5/5 strength throughout lower extremity. No lower extremity edema. Right calf is not tender, erythematous, edematous. Pedal pulses 2+ throughout. Left great toe is mildly tender on palpation of medial border, not edematous. No joint deformities, erythema, or stiffness, ROM full. Skin: Warm, dry and intact. No  rashes or erythema.     Assessment & Plan:   Please see problem based assessment and plan.

## 2014-02-04 NOTE — Assessment & Plan Note (Signed)
Assessment: Patient complains of intermittent burning, tingling and numbness in her right calf that has occurred twice in the last week at night. Pain resolves and is not worsened by anything. She denies any injury, swelling, erythema, tenderness or increased warmth. She has not tried anything for the pain. Physical examination is unremarkable (see PE) and is not likely DVT, claudication, PVD or neuropathy. Patient may be impinging on a nerve during sleep as she lays on her side.  Plan:  -Conservative treatment for now -Place pillow between legs during sleep -If pain worsens or persists, patient will call

## 2014-02-04 NOTE — Assessment & Plan Note (Addendum)
Assessment: Patient stubbed her left great toe 2 weeks ago after getting out of the shower. Tender along medial border, but no evidence of fracture or dislocation. Patient has no difficulty walking, normal movement, appearance and sensation.  Plan: -Continue conservative management -If pain persists in 2 weeks, will call back for advice or appointment -Consider imaging and/or trial of Naproxen 500 mg BID

## 2014-02-05 NOTE — Progress Notes (Signed)
Internal Medicine Clinic Attending  Date of Visit:02/04/2014  I saw and evaluated the patient.  I personally confirmed the key portions of the history and exam documented by Dr. Marvel Plan and I reviewed pertinent patient test results.  The assessment, diagnosis, and plan were formulated together and I agree with the documentation in the resident's note, with the following additional comments.  Patient reports 1 or 2 episodes of transient tingling and numbness in her right leg when she awakens in the morning which quickly resolve with activity.  Examination is unremarkable.  This may be the result of sleeping position on her side.  Agree with plans as outlined by Dr. Marvel Plan.

## 2014-03-03 ENCOUNTER — Other Ambulatory Visit: Payer: Self-pay | Admitting: *Deleted

## 2014-03-03 DIAGNOSIS — G8929 Other chronic pain: Secondary | ICD-10-CM

## 2014-03-03 DIAGNOSIS — M545 Low back pain, unspecified: Secondary | ICD-10-CM

## 2014-03-03 MED ORDER — HYDROCODONE-ACETAMINOPHEN 7.5-325 MG PO TABS: 1.0000 | ORAL_TABLET | Freq: Two times a day (BID) | ORAL | Status: DC | PRN

## 2014-03-03 NOTE — Telephone Encounter (Signed)
Last refill 12/3 Will you write and post date?  # [id number]

## 2014-03-09 ENCOUNTER — Ambulatory Visit: Payer: No Typology Code available for payment source | Admitting: Internal Medicine

## 2014-04-02 ENCOUNTER — Other Ambulatory Visit: Payer: Self-pay | Admitting: *Deleted

## 2014-04-02 DIAGNOSIS — M545 Low back pain, unspecified: Secondary | ICD-10-CM

## 2014-04-02 DIAGNOSIS — G8929 Other chronic pain: Secondary | ICD-10-CM

## 2014-04-02 MED ORDER — HYDROCODONE-ACETAMINOPHEN 7.5-325 MG PO TABS: 1.0000 | ORAL_TABLET | Freq: Two times a day (BID) | ORAL | Status: DC | PRN

## 2014-04-02 NOTE — Telephone Encounter (Signed)
Pt # [id number] Last refilled 03/04/14

## 2014-04-02 NOTE — Telephone Encounter (Signed)
Pt informed Rx is ready 

## 2014-04-28 ENCOUNTER — Other Ambulatory Visit: Payer: Self-pay | Admitting: *Deleted

## 2014-04-28 DIAGNOSIS — M545 Low back pain: Principal | ICD-10-CM

## 2014-04-28 DIAGNOSIS — G8929 Other chronic pain: Secondary | ICD-10-CM

## 2014-04-29 MED ORDER — HYDROCODONE-ACETAMINOPHEN 7.5-325 MG PO TABS: 1.0000 | ORAL_TABLET | Freq: Two times a day (BID) | ORAL | Status: DC | PRN

## 2014-04-29 NOTE — Telephone Encounter (Signed)
Pt informed Rx is ready 

## 2014-05-31 ENCOUNTER — Other Ambulatory Visit: Payer: Self-pay | Admitting: *Deleted

## 2014-05-31 DIAGNOSIS — M545 Low back pain: Principal | ICD-10-CM

## 2014-05-31 DIAGNOSIS — G8929 Other chronic pain: Secondary | ICD-10-CM

## 2014-06-03 MED ORDER — HYDROCODONE-ACETAMINOPHEN 7.5-325 MG PO TABS: 1.0000 | ORAL_TABLET | Freq: Two times a day (BID) | ORAL | Status: DC | PRN

## 2014-06-03 NOTE — Addendum Note (Signed)
Addended by: Drucilla Schmidt E on: 06/03/2014 03:47 PM   Modules accepted: Orders

## 2014-06-03 NOTE — Telephone Encounter (Signed)
notified

## 2014-06-03 NOTE — Telephone Encounter (Signed)
Lm for rtc 

## 2014-06-09 NOTE — Telephone Encounter (Signed)
Have called and left messages

## 2014-06-09 NOTE — Telephone Encounter (Signed)
Pt.notified

## 2014-06-15 ENCOUNTER — Ambulatory Visit: Payer: No Typology Code available for payment source | Admitting: Internal Medicine

## 2014-06-28 ENCOUNTER — Emergency Department (INDEPENDENT_AMBULATORY_CARE_PROVIDER_SITE_OTHER)
Admission: EM | Admit: 2014-06-28 | Discharge: 2014-06-28 | Disposition: A | Discharge status: Home / Self Care | Payer: No Typology Code available for payment source | Source: Home / Self Care | Attending: Family Medicine | Admitting: Family Medicine

## 2014-06-28 ENCOUNTER — Telehealth (HOSPITAL_COMMUNITY): Payer: Self-pay | Admitting: Family Medicine

## 2014-06-28 ENCOUNTER — Encounter (HOSPITAL_COMMUNITY): Payer: Self-pay | Admitting: Emergency Medicine

## 2014-06-28 DIAGNOSIS — J40 Bronchitis, not specified as acute or chronic: Secondary | ICD-10-CM | POA: Diagnosis not present

## 2014-06-28 DIAGNOSIS — Z720 Tobacco use: Secondary | ICD-10-CM

## 2014-06-28 DIAGNOSIS — F17200 Nicotine dependence, unspecified, uncomplicated: Secondary | ICD-10-CM

## 2014-06-28 MED ORDER — IPRATROPIUM-ALBUTEROL 0.5-2.5 (3) MG/3ML IN SOLN
3.0000 mL | Freq: Once | RESPIRATORY_TRACT | Status: AC
  Administered 2014-06-28: 3 mL via RESPIRATORY_TRACT

## 2014-06-28 MED ORDER — GUAIFENESIN-CODEINE 100-10 MG/5ML PO SOLN: 5.0000 mL | Freq: Every evening | ORAL | Status: DC | PRN

## 2014-06-28 MED ORDER — PREDNISONE 50 MG PO TABS: 50.0000 mg | ORAL_TABLET | Freq: Every day | ORAL | Status: DC

## 2014-06-28 MED ORDER — IPRATROPIUM-ALBUTEROL 0.5-2.5 (3) MG/3ML IN SOLN
RESPIRATORY_TRACT | Status: AC
  Filled 2014-06-28: qty 3

## 2014-06-28 MED ORDER — ALBUTEROL SULFATE HFA 108 (90 BASE) MCG/ACT IN AERS: 2.0000 | INHALATION_SPRAY | Freq: Four times a day (QID) | RESPIRATORY_TRACT | Status: DC | PRN

## 2014-06-28 NOTE — ED Notes (Signed)
Pt has been suffering from a cough for about 3 weeks.  Pt is a one pack a day smoker and thinks this is probably her issue.  Pt denies fever, nasal or chest congestion.

## 2014-06-28 NOTE — ED Provider Notes (Signed)
Christy Burke is a 54 y.o. female who presents to Urgent Care today for cough wheezing shortness of breath. Cough is productive. Patient has some wheezing chest tightness and some mild shortness of breath. She has not tried any medications yet. She feels well otherwise no fevers chills vomiting or diarrhea. She is a heavy smoker but does not have a history of COPD this been diagnosed.   History reviewed. No pertinent past medical history. Past Surgical History  Procedure Laterality Date  . Umbilical hernia repair  1990   History  Substance Use Topics  . Smoking status: Current Every Day Smoker -- 1.00 packs/day    Types: Cigarettes  . Smokeless tobacco: Never Used     Comment: Has set a day.  . 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   Current Outpatient Prescriptions  Medication Sig Dispense Refill  . HYDROcodone-acetaminophen (NORCO) 7.5-325 MG per tablet Take 1 tablet by mouth 2 (two) times daily as needed for moderate pain. 60 tablet 0  . albuterol (PROVENTIL HFA;VENTOLIN HFA) 108 (90 BASE) MCG/ACT inhaler Inhale 2 puffs into the lungs every 6 (six) hours as needed for wheezing or shortness of breath. 1 Inhaler 2  . guaiFENesin-codeine 100-10 MG/5ML syrup Take 5 mLs by mouth at bedtime as needed for cough. 120 mL 0  . predniSONE (DELTASONE) 50 MG tablet Take 1 tablet (50 mg total) by mouth daily. 5 tablet 0   No Known Allergies   Exam:  BP 133/88 mmHg  Pulse 80  Temp(Src) 98.8 F (37.1 C) (Oral)  Resp 24  SpO2 97%  LMP 05/28/2014 (Approximate) Gen: Well NAD HEENT: EOMI,  MMM Lungs: Normal work of breathing. CTABL Heart: RRR no MRG Abd: NABS, Soft. Nondistended, Nontender Exts: Brisk capillary refill, warm and well perfused.   Patient was given a 2.5/0.5 mg DuoNeb nebulizer treatment and felt better  No results found  for this or any previous visit (from the past 24 hour(s)). No results found.  Assessment and Plan: 54 y.o. female with asthma versus early COPD exacerbation. Treat with prednisone albuterol codeine cough syrup and smoking cessation.  Discussed warning signs or symptoms. Please see discharge instructions. Patient expresses understanding.     Gregor Hams, MD 06/28/14 [phone]

## 2014-06-28 NOTE — Discharge Instructions (Signed)
Thank you for coming in today. Call or go to the emergency room if you get worse, have trouble breathing, have chest pains, or palpitations.  Call 1-800-QUIT-NOW for free tobacco quitting guidance. Set a quit date in throat your cigarettes away. Get your wife to quit at the same time if possible. You can use 21 mg nicotine patches and nicotine gum as needed.  Take prednisone daily for 5 days for lung inflammation. Use cough syrup at bedtime for cough as needed. Do not drive or work after taking this medicine. Use albuterol inhaler as needed for wheezing or cough or chest tightness.  Chronic Obstructive Pulmonary Disease Exacerbation Chronic obstructive pulmonary disease (COPD) is a common lung condition in which airflow from the lungs is limited. COPD is a general term that can be used to describe many different lung problems that limit airflow, including chronic bronchitis and emphysema. COPD exacerbations are episodes when breathing symptoms become much worse and require extra treatment. Without treatment, COPD exacerbations can be life threatening, and frequent COPD exacerbations can cause further damage to your lungs. CAUSES   Respiratory infections.   Exposure to smoke.   Exposure to air pollution, chemical fumes, or dust. Sometimes there is no apparent cause or trigger. RISK FACTORS  Smoking cigarettes.  Older age.  Frequent prior COPD exacerbations. SIGNS AND SYMPTOMS   Increased coughing.   Increased thick spit (sputum) production.   Increased wheezing.   Increased shortness of breath.   Rapid breathing.   Chest tightness. DIAGNOSIS  Your medical history, a physical exam, and tests will help your health care provider make a diagnosis. Tests may include:  A chest X-ray.  Basic lab tests.  Sputum testing.  An arterial blood gas test. TREATMENT  Depending on the severity of your COPD exacerbation, you may need to be admitted to a hospital for treatment.  Some of the treatments commonly used to treat COPD exacerbations are:   Antibiotic medicines.   Bronchodilators. These are drugs that expand the air passages. They may be given with an inhaler or nebulizer. Spacer devices may be needed to help improve drug delivery.  Corticosteroid medicines.  Supplemental oxygen therapy.  HOME CARE INSTRUCTIONS   Do not smoke. Quitting smoking is very important to prevent COPD from getting worse and exacerbations from happening as often.  Avoid exposure to all substances that irritate the airway, especially to tobacco smoke.   If you were prescribed an antibiotic medicine, finish it all even if you start to feel better.  Take all medicines as directed by your health care provider.It is important to use correct technique with inhaled medicines.  Drink enough fluids to keep your urine clear or pale yellow (unless you have a medical condition that requires fluid restriction).  Use a cool mist vaporizer. This makes it easier to clear your chest when you cough.   If you have a home nebulizer and oxygen, continue to use them as directed.   Maintain all necessary vaccinations to prevent infections.   Exercise regularly.   Eat a healthy diet.   Keep all follow-up appointments as directed by your health care provider. SEEK IMMEDIATE MEDICAL CARE IF:  You have worsening shortness of breath.   You have trouble talking.   You have severe chest pain.  You have blood in your sputum.  You have a fever.  You have weakness, vomit repeatedly, or faint.   You feel confused.   You continue to get worse. MAKE SURE YOU:   Understand  these instructions.  Will watch your condition.  Will get help right away if you are not doing well or get worse. Document Released: 12/17/2006 Document Revised: 07/06/2013 Document Reviewed: 10/24/2012 [facility] Patient Information 2015 [place], [place]. This information is not intended to replace advice  given to you by your health care provider. Make sure you discuss any questions you have with your health care provider.

## 2014-07-05 ENCOUNTER — Encounter: Payer: Self-pay | Admitting: *Deleted

## 2014-07-07 ENCOUNTER — Other Ambulatory Visit: Payer: Self-pay | Admitting: *Deleted

## 2014-07-07 DIAGNOSIS — G8929 Other chronic pain: Secondary | ICD-10-CM

## 2014-07-07 DIAGNOSIS — M545 Low back pain, unspecified: Secondary | ICD-10-CM

## 2014-07-08 MED ORDER — HYDROCODONE-ACETAMINOPHEN 7.5-325 MG PO TABS: 1.0000 | ORAL_TABLET | Freq: Two times a day (BID) | ORAL | Status: DC | PRN

## 2014-07-12 MED ORDER — HYDROCODONE-ACETAMINOPHEN 7.5-325 MG PO TABS: 1.0000 | ORAL_TABLET | Freq: Two times a day (BID) | ORAL | Status: DC | PRN

## 2014-07-12 NOTE — Telephone Encounter (Signed)
Patient has not received the refill prescription per RN and I checked the [place] database and patient has not had a refill since April 7th. I will refill pain meds for now

## 2014-07-12 NOTE — Addendum Note (Signed)
Addended by: Aldine Contes on: 07/12/2014 11:38 AM   Modules accepted: Orders

## 2014-07-12 NOTE — ED Notes (Signed)
Note opened in error  Gregor Hams, MD 07/12/14 [phone]

## 2014-07-12 NOTE — Telephone Encounter (Signed)
Pt aware Rx ready 

## 2014-08-31 ENCOUNTER — Other Ambulatory Visit: Payer: Self-pay | Admitting: *Deleted

## 2014-08-31 DIAGNOSIS — G8929 Other chronic pain: Secondary | ICD-10-CM

## 2014-08-31 DIAGNOSIS — M545 Low back pain, unspecified: Secondary | ICD-10-CM

## 2014-08-31 MED ORDER — HYDROCODONE-ACETAMINOPHEN 7.5-325 MG PO TABS: 1.0000 | ORAL_TABLET | Freq: Two times a day (BID) | ORAL | Status: DC | PRN

## 2014-08-31 NOTE — Telephone Encounter (Signed)
Per [facility] database, last Rx was on 08/11/14.   Will fill, but with Rx dated for 09/10/14.    Thanks, needs to be assigned new PCP.

## 2014-08-31 NOTE — Telephone Encounter (Signed)
Last refill - 07/12/14. Next appt is 09/07/14 with Dr Marlowe Sax.

## 2014-08-31 NOTE — Telephone Encounter (Signed)
Rx ready - pt called/informed. 

## 2014-09-07 ENCOUNTER — Encounter: Payer: Self-pay | Admitting: Internal Medicine

## 2014-09-07 ENCOUNTER — Ambulatory Visit (INDEPENDENT_AMBULATORY_CARE_PROVIDER_SITE_OTHER): Payer: No Typology Code available for payment source | Admitting: Internal Medicine

## 2014-09-07 VITALS — BP 121/80 | HR 86 | Temp 98.0°F | Wt 186.3 lb

## 2014-09-07 DIAGNOSIS — L299 Pruritus, unspecified: Secondary | ICD-10-CM | POA: Diagnosis not present

## 2014-09-07 DIAGNOSIS — Z206 Contact with and (suspected) exposure to human immunodeficiency virus [HIV]: Secondary | ICD-10-CM

## 2014-09-07 DIAGNOSIS — G894 Chronic pain syndrome: Secondary | ICD-10-CM

## 2014-09-07 DIAGNOSIS — F17210 Nicotine dependence, cigarettes, uncomplicated: Secondary | ICD-10-CM

## 2014-09-07 DIAGNOSIS — M549 Dorsalgia, unspecified: Secondary | ICD-10-CM

## 2014-09-07 DIAGNOSIS — Z139 Encounter for screening, unspecified: Secondary | ICD-10-CM

## 2014-09-07 DIAGNOSIS — F17200 Nicotine dependence, unspecified, uncomplicated: Secondary | ICD-10-CM

## 2014-09-07 DIAGNOSIS — E669 Obesity, unspecified: Secondary | ICD-10-CM | POA: Diagnosis not present

## 2014-09-07 DIAGNOSIS — Z0000 Encounter for general adult medical examination without abnormal findings: Secondary | ICD-10-CM | POA: Insufficient documentation

## 2014-09-07 DIAGNOSIS — R21 Rash and other nonspecific skin eruption: Secondary | ICD-10-CM

## 2014-09-07 DIAGNOSIS — Z131 Encounter for screening for diabetes mellitus: Secondary | ICD-10-CM | POA: Diagnosis not present

## 2014-09-07 LAB — GLUCOSE, CAPILLARY: Glucose-Capillary: 82 mg/dL (ref 65–99)

## 2014-09-07 LAB — POCT GLYCOSYLATED HEMOGLOBIN (HGB A1C): HEMOGLOBIN A1C: 5.5

## 2014-09-07 MED ORDER — NICOTINE 21 MG/24HR TD PT24: 21.0000 mg | MEDICATED_PATCH | TRANSDERMAL | Status: DC

## 2014-09-07 NOTE — Patient Instructions (Signed)
It was a pleasure meeting you today. Good luck with smoking cessation. I will see you in 6 weeks.

## 2014-09-07 NOTE — Progress Notes (Signed)
Patient ID: Christy Burke, female   DOB: 1960/09/12, 54 y.o.   MRN: [id number]   Subjective:   Patient ID: Christy Burke female   DOB: 1960-06-06 54 y.o.   MRN: [id number]  HPI: [name] is a 54 y.o. chronic smoker who presents to the clinic for a routine checkup. Pt reports she is concerned about having scabies because her 52 year old Christy Burke was recently diagnosed and treated for it. She states the child stays with her and shares the bed. Reports she "itches sometimes" but has not noticed any skin changes/ rash. Denies having excessive pruritis, never wakes her up at night or hinders activities of daily living. In addition, pt mentioned her same sex marriage partner has HIV and that she would like to be tested for it. Pt also states that the last time she had a DEXA scan (05/2013) she was told that she had osteoporosis and was advised to take Vit. C for it.   ROS negative except above.   No past medical history on file. Current Outpatient Prescriptions  Medication Sig Dispense Refill  . albuterol (PROVENTIL HFA;VENTOLIN HFA) 108 (90 BASE) MCG/ACT inhaler Inhale 2 puffs into the lungs every 6 (six) hours as needed for wheezing or shortness of breath. 1 Inhaler 2  . guaiFENesin-codeine 100-10 MG/5ML syrup Take 5 mLs by mouth at bedtime as needed for cough. 120 mL 0  . HYDROcodone-acetaminophen (NORCO) 7.5-325 MG per tablet Take 1 tablet by mouth 2 (two) times daily as needed for moderate pain. 60 tablet 0  . nicotine (NICODERM CQ - DOSED IN MG/24 HOURS) 21 mg/24hr patch Place 1 patch (21 mg total) onto the skin daily. 42 patch 0  . predniSONE (DELTASONE) 50 MG tablet Take 1 tablet (50 mg total) by mouth daily. 5 tablet 0   No current facility-administered medications for this visit.   Family History  Problem Relation Age of Onset  . Cancer Mother   . Diabetes Father   . Colon cancer Neg Hx    History   Social History  . Marital Status: Single    Spouse Name: N/A  . Number of  Children: N/A  . Years of Education: N/A   Social History Main Topics  . Smoking status: Current Every Day Smoker -- 1.00 packs/day    Types: Cigarettes  . Smokeless tobacco: Never Used     Comment: Has set a day.  . 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 Drug Use: No     Comment: Quit several years ago, never did IVDU only smoked cocaine  . Sexual Activity: Yes     Comment: In same sex relationship for 19 years   Other Topics Concern  . None   Social History Narrative   Review of Systems: General: Denies fever, chills, diaphoresis, appetite change and fatigue.  Respiratory: Denies SOB, DOE, cough, chest tightness, and wheezing.   Cardiovascular: Denies chest pain and palpitations.  Gastrointestinal: Denies nausea, vomiting, abdominal pain, diarrhea, constipation Genitourinary: Denies dysuria, urgency, frequency, flank pain Endocrine: Denies hot or cold intolerance, polyuria, and polydipsia. Musculoskeletal: Denies myalgias, back pain, joint swelling, arthralgias   Skin: occasional pruritis, Denies pallor, rash and wounds.  Neurological: Denies dizziness, seizures, syncope, weakness, lightheadedness, numbness and headaches.    Objective:  Physical Exam: General: Vital signs reviewed.  Patient is a well-developed and well-nourished, in no acute distress and cooperative with exam.  Head: Normocephalic and atraumatic. Eyes:  PERRL, EOMI, conjunctivae normal, No scleral icterus.  Neck: Supple, trachea midline, normal ROM Cardiovascular: RRR, S1 normal, S2 normal, no murmurs, gallops, or rubs. Pulmonary/Chest: Air entry equal bilaterally, no wheezes, rales, or rhonchi. Abdominal: Soft, non-tender, non-distended, BS +, no masses, organomegaly, or guarding present.  Musculoskeletal: No joint deformities, erythema, or stiffness, ROM full and nontender. Extremities: No swelling or edema,  pulses symmetric and intact  bilaterally. No cyanosis or clubbing. Neurological: A&O x3, Strength is normal and symmetric bilaterally, cranial nerve II-XII are grossly intact, no focal motor deficit, sensory intact to light touch bilaterally.  Skin: Warm, dry and intact. No rashes or erythema. Psychiatric: Normal mood and affect.  Filed Vitals:   09/07/14 1337  BP: 121/80  Pulse: 86  Temp: 98 F (36.7 C)  TempSrc: Oral  Weight: 186 lb 4.8 oz (84.505 kg)  SpO2: 100%    Assessment & Plan:  Plan discussed with patient [name] in detail.

## 2014-09-07 NOTE — Assessment & Plan Note (Signed)
Patient has occasional pruritis. Physical examination did not show any rash or skin changes. I checked the webbed spaces of hands and toes, no skin changes noted. Patient stated she changes soaps often. Pruritis likely due to dry skin from changing soaps. -Advised to maintain good hygiene  - Use sensitive skin soaps

## 2014-09-07 NOTE — Assessment & Plan Note (Signed)
Patient states she takes Norco PRN back pain. Did not request any refills today.

## 2014-09-07 NOTE — Assessment & Plan Note (Addendum)
Patient states she has been smoking 1/2 - 1 PPD since the age of 10. I discussed tobacco cessation with her and she is interested in quitting. Patient agreed to starting on nicotine patches.  - Nicoderm CQ 21 mg/ 24 hrs, 1 patch everyday  - Follow up in 6 weeks

## 2014-09-07 NOTE — Assessment & Plan Note (Signed)
-   Patient was under the understanding that she had osteoporosis. However, her DEXA scan results from 05/2013 were normal. She does not have risk factors for osteoporosis such low BMI, poor nutrition, no history of previous fractures. She was encouraged to take OTC calcium + vitamin D for bone health.  - Mammogram scheduled today, last done >1 yr ago -Ordered HIV antibodies as requested by the patient because she is high risk for exposure -Ordered routine lipid panel  -stat Hgb A1C was 5.5 and glucose 82. No further intervention needed.  -Routine Urine Drug Screen -Discussed routine Pap, patient wishes to do it in the future. Will f/u at next visit.  -Discussed PFT considering pts long smoking history. She wishes to do it in the future. Will f/u at next visit.

## 2014-09-08 LAB — PRESCRIPTION ABUSE MONITORING 15P, URINE
AMPHETAMINE/METH: NEGATIVE ng/mL
BARBITURATE SCREEN, URINE: NEGATIVE ng/mL
Benzodiazepine Screen, Urine: NEGATIVE ng/mL
Buprenorphine, Urine: NEGATIVE ng/mL
CARISOPRODOL, URINE: NEGATIVE ng/mL
COCAINE METABOLITES: NEGATIVE ng/mL
Cannabinoid Scrn, Ur: NEGATIVE ng/mL
Creatinine, Urine: 128.91 mg/dL (ref >20.0)
FENTANYL URINE: NEGATIVE ng/mL
MEPERIDINE UR: NEGATIVE ng/mL
Methadone Screen, Urine: NEGATIVE ng/mL
OXYCODONE SCRN UR: NEGATIVE ng/mL
Opiate Screen, Urine: NEGATIVE ng/mL
PROPOXYPHENE: NEGATIVE ng/mL
Tramadol Scrn, Ur: NEGATIVE ng/mL
Zolpidem, Urine: NEGATIVE ng/mL

## 2014-09-08 LAB — LIPID PANEL
CHOLESTEROL: 147 mg/dL (ref 0–200)
HDL: 39 mg/dL — ABNORMAL LOW (ref >=46)
LDL Cholesterol: 88 mg/dL (ref 0–99)
TRIGLYCERIDES: 99 mg/dL (ref <150)
Total CHOL/HDL Ratio: 3.8 Ratio
VLDL: 20 mg/dL (ref 0–40)

## 2014-09-08 LAB — HIV ANTIBODY (ROUTINE TESTING W REFLEX): HIV 1&2 Ab, 4th Generation: NONREACTIVE

## 2014-09-08 NOTE — Progress Notes (Addendum)
Internal Medicine Clinic Attending  I saw and evaluated the patient.  I personally confirmed the key portions of the history and exam documented by Dr. Marlowe Sax and I reviewed pertinent patient test results.  The assessment, diagnosis, and plan were formulated together and I agree with the documentation in the resident's note. I spoke to the pt about her opioid use. She gets hydrocodone 7.5 #60 per month. On this med since 2015. Indiction is back pain but plain film 06/2013 was nl. I asked her to explain how she takes her med and she stated one pill at night to help her sleep. She insisted that she never took one during day bc it made her sleepy. She then clarified that sometimes during weekend, she will take 2 bc doesn't work. Asked if she took it night before visit, she said no. On further questioning, none for 4 nights. There were a lot of red flags but red flags do not always indicate misuse / abuse. For now, rec Dr Marlowe Sax est rapport with pt and cont to follow use. Database Ok today. UDS today (expect it to be negative).

## 2014-10-04 ENCOUNTER — Other Ambulatory Visit: Payer: Self-pay | Admitting: Internal Medicine

## 2014-10-04 ENCOUNTER — Ambulatory Visit (HOSPITAL_COMMUNITY)
Admission: RE | Admit: 2014-10-04 | Discharge: 2014-10-04 | Disposition: A | Discharge status: Home / Self Care | Payer: No Typology Code available for payment source | Source: Ambulatory Visit | Attending: Internal Medicine | Admitting: Internal Medicine

## 2014-10-04 DIAGNOSIS — Z1231 Encounter for screening mammogram for malignant neoplasm of breast: Secondary | ICD-10-CM | POA: Diagnosis not present

## 2014-10-04 DIAGNOSIS — Z139 Encounter for screening, unspecified: Secondary | ICD-10-CM

## 2014-10-06 ENCOUNTER — Other Ambulatory Visit: Payer: Self-pay | Admitting: Internal Medicine

## 2014-10-06 DIAGNOSIS — M545 Low back pain: Principal | ICD-10-CM

## 2014-10-06 DIAGNOSIS — G8929 Other chronic pain: Secondary | ICD-10-CM

## 2014-10-06 MED ORDER — HYDROCODONE-ACETAMINOPHEN 7.5-325 MG PO TABS: 1.0000 | ORAL_TABLET | Freq: Two times a day (BID) | ORAL | Status: DC | PRN

## 2014-10-06 NOTE — Telephone Encounter (Signed)
I had documented my concerns at July visit. Has appt this month with PCP. Pt needs to attend that appt.

## 2014-10-06 NOTE — Telephone Encounter (Signed)
Pt requesting pain meds to be filled.

## 2014-10-06 NOTE — Telephone Encounter (Signed)
Message left Rx is ready and reminded to keep appt in clinic.

## 2014-10-19 ENCOUNTER — Ambulatory Visit (INDEPENDENT_AMBULATORY_CARE_PROVIDER_SITE_OTHER): Payer: No Typology Code available for payment source | Admitting: Internal Medicine

## 2014-10-19 VITALS — BP 133/99 | HR 87 | Temp 98.3°F | Wt 181.1 lb

## 2014-10-19 DIAGNOSIS — Z0000 Encounter for general adult medical examination without abnormal findings: Secondary | ICD-10-CM

## 2014-10-19 DIAGNOSIS — Z638 Other specified problems related to primary support group: Secondary | ICD-10-CM | POA: Diagnosis not present

## 2014-10-19 DIAGNOSIS — F439 Reaction to severe stress, unspecified: Secondary | ICD-10-CM

## 2014-10-19 DIAGNOSIS — Z720 Tobacco use: Secondary | ICD-10-CM

## 2014-10-19 DIAGNOSIS — F17200 Nicotine dependence, unspecified, uncomplicated: Secondary | ICD-10-CM

## 2014-10-19 MED ORDER — NICOTINE 7 MG/24HR TD PT24: 7.0000 mg | MEDICATED_PATCH | TRANSDERMAL | Status: DC

## 2014-10-19 MED ORDER — NICOTINE 14 MG/24HR TD PT24: 14.0000 mg | MEDICATED_PATCH | TRANSDERMAL | Status: DC

## 2014-10-19 NOTE — Patient Instructions (Signed)
Christy Burke it nice seeing you today. I want to congratulate you on not smoking for the past 34 days and using your Nicotine patches regularly. Please return for a follow up in 1 month.  Finish using Nicoderm 21 mg/ 24 hr 1 patch everyday for a total of 6 weeks.  Then use Nicoderm 14 mg/ 24 hr 1 patch everyday for 2 weeks.  Then Nicoderm 7 mg/ 24 hr 1 patch everyday for 2 weeks.

## 2014-10-20 ENCOUNTER — Encounter: Payer: Self-pay | Admitting: Internal Medicine

## 2014-10-20 DIAGNOSIS — F439 Reaction to severe stress, unspecified: Secondary | ICD-10-CM | POA: Insufficient documentation

## 2014-10-20 NOTE — Assessment & Plan Note (Signed)
-  Mammogram is normal, follow-up in one year.  -HIV antibody nonreactive. Since patient's same-sex marriage partner has HIV, screening for HIV antibody is recommended every 3-6 months. -Lipid panel showed HDL 39. Her 10 year ASCVD risk score is 4%. She has been advised to make lifestyle changes such as healthy eating and exercise. No need for statin therapy at this time. -Hemoglobin A1c 5.5 -Patient states her menstrual cycle is about to start soon and she does not want to a Pap smear at this time. Ask her at next visit. -PFT ordered today due to patient's long-standing smoking history.

## 2014-10-20 NOTE — Assessment & Plan Note (Signed)
During the interview the patient started crying and stated she has been having stress due to marital problems. Denies any changes in her sleep, loss of interest, any feelings of guilt, loss of energy, or changes in appetite. States it is difficult for her to concentrate on tasks because of her marriage problems. Denies any suicidal or homicidal ideation. Patient will be going to a therapist/psychiatrist through her job soon. -I do not think the patient meets clinical criteria for MDD. Will reassess at next visit in 1 month.

## 2014-10-20 NOTE — Progress Notes (Signed)
Subjective:   Patient ID: Christy Burke female   DOB: 1960-04-28 54 y.o.   MRN: [id number]  HPI: [name] is a 54 y.o. with a past medical history of tobacco use presenting to the clinic today for a follow-up. During her previous visit patient was prescribed nicotine patches for smoking cessation. Patient states she has not smoked for the past 34 days and has been using the patches every day. States yesterday afternoon she had an argument with her spouse and that's the only time she smoked cigarettes. She wishes to continue using the patches and states she will not be smoking anymore. During the interview the patient started crying and stated she has been having stress due to marital problems. States her mom passed away 3 years ago and she does not have anyone to speak to. She denies any changes in her sleep, loss of interest, any feelings of guilt, loss of energy, or changes in appetite. States it is difficult for her to concentrate on tasks because of her marriage problems. Denies any suicidal or homicidal ideation. States she will be seeing a therapist/psychiatrist through her job.    No past medical history on file. Current Outpatient Prescriptions  Medication Sig Dispense Refill  . albuterol (PROVENTIL HFA;VENTOLIN HFA) 108 (90 BASE) MCG/ACT inhaler Inhale 2 puffs into the lungs every 6 (six) hours as needed for wheezing or shortness of breath. 1 Inhaler 2  . guaiFENesin-codeine 100-10 MG/5ML syrup Take 5 mLs by mouth at bedtime as needed for cough. 120 mL 0  . HYDROcodone-acetaminophen (NORCO) 7.5-325 MG per tablet Take 1 tablet by mouth 2 (two) times daily as needed for moderate pain. 60 tablet 0  . nicotine (NICODERM CQ - DOSED IN MG/24 HOURS) 14 mg/24hr patch Place 1 patch (14 mg total) onto the skin daily. 14 patch 0  . nicotine (NICODERM CQ - DOSED IN MG/24 HOURS) 21 mg/24hr patch Place 1 patch (21 mg total) onto the skin daily. 42 patch 0  . nicotine (NICODERM CQ - DOSED IN  MG/24 HR) 7 mg/24hr patch Place 1 patch (7 mg total) onto the skin daily. 14 patch 0  . predniSONE (DELTASONE) 50 MG tablet Take 1 tablet (50 mg total) by mouth daily. 5 tablet 0   No current facility-administered medications for this visit.   Family History  Problem Relation Age of Onset  . Cancer Mother   . Diabetes Father   . Colon cancer Neg Hx    Social History   Social History  . Marital Status: Single    Spouse Name: N/A  . Number of Children: N/A  . Years of Education: N/A   Social History Main Topics  . Smoking status: Current Every Day Smoker -- 1.00 packs/day    Types: Cigarettes  . Smokeless tobacco: Never Used     Comment: Has set a day.  . 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 Drug Use: No     Comment: Quit several years ago, never did IVDU only smoked cocaine  . Sexual Activity: Yes     Comment: In same sex relationship for 19 years   Other Topics Concern  . Not on file   Social History Narrative   Review of Systems: Review of Systems  Constitutional: Negative for fever, chills, weight loss and malaise/fatigue.  HENT: Negative for ear discharge.   Eyes: Negative for blurred vision and pain.  Respiratory: Negative for cough,  shortness of breath and wheezing.   Cardiovascular: Negative for chest pain, palpitations and leg swelling.  Gastrointestinal: Negative for nausea, vomiting, abdominal pain, diarrhea and constipation.  Genitourinary: Negative.   Musculoskeletal: Negative.   Skin: Negative for itching and rash.  Neurological: Negative for dizziness, sensory change, focal weakness and headaches.  Psychiatric/Behavioral: Negative for suicidal ideas. The patient does not have insomnia.        Decreased concentration    Objective:  Physical Exam: Filed Vitals:   10/19/14 0936  BP: 133/99  Pulse: 87  Temp: 98.3 F (36.8 C)  TempSrc: Oral  Weight: 181 lb 1.6 oz (82.146 kg)  SpO2: 100%    Physical Exam  Constitutional: She is oriented to person, place, and time. No distress.  HENT:  Head: Normocephalic and atraumatic.  Eyes: EOM are normal. Pupils are equal, round, and reactive to light.  Neck: Neck supple. No tracheal deviation present.  Cardiovascular: Normal rate, regular rhythm and intact distal pulses.   No murmur heard. Pulmonary/Chest: Effort normal and breath sounds normal. No respiratory distress. She has no wheezes. She has no rales.  Abdominal: Soft. Bowel sounds are normal. She exhibits no distension. There is no tenderness.  Musculoskeletal: Normal range of motion. She exhibits no edema or tenderness.  Neurological: She is alert and oriented to person, place, and time. No cranial nerve deficit.  Skin: Skin is warm and dry. No rash noted. No erythema.     Assessment & Plan:

## 2014-10-20 NOTE — Assessment & Plan Note (Signed)
Patient has been compliant with her nicotine patches and has not smoked for the past 34 days. Just one incidence of smoking yesterday due to life stressors. She wishes to continue using nicotine patches. During her previous visit she was prescribed Nicotine 21 mg per day for 6 weeks. -Continue with nicotine 14 mg per day for 2 weeks. Followed by nicotine 7 mg per day for 2 weeks. -Patient has a follow-up visit in 1 month

## 2014-10-21 NOTE — Progress Notes (Signed)
I saw and evaluated the patient.  I personally confirmed the key portions of Dr. Rathore's history and exam and reviewed pertinent patient test results.  The assessment, diagnosis, and plan were formulated together and I agree with the documentation in the resident's note. 

## 2014-11-05 ENCOUNTER — Telehealth: Payer: Self-pay | Admitting: Internal Medicine

## 2014-11-05 ENCOUNTER — Other Ambulatory Visit: Payer: Self-pay | Admitting: Internal Medicine

## 2014-11-05 DIAGNOSIS — G8929 Other chronic pain: Secondary | ICD-10-CM

## 2014-11-05 DIAGNOSIS — M545 Low back pain: Principal | ICD-10-CM

## 2014-11-05 MED ORDER — HYDROCODONE-ACETAMINOPHEN 7.5-325 MG PO TABS: 1.0000 | ORAL_TABLET | Freq: Two times a day (BID) | ORAL | Status: DC | PRN

## 2014-11-05 NOTE — Telephone Encounter (Signed)
Pt called requesting hydrocodone to be filled.  °

## 2014-11-05 NOTE — Telephone Encounter (Signed)
Pt aware Rx is ready. 

## 2014-11-05 NOTE — Telephone Encounter (Signed)
Pt aware.

## 2014-11-05 NOTE — Telephone Encounter (Signed)
Patient called for her refill on her hydrocodone.

## 2014-11-12 ENCOUNTER — Inpatient Hospital Stay (HOSPITAL_COMMUNITY): Admission: RE | Admit: 2014-11-12 | Payer: No Typology Code available for payment source | Source: Ambulatory Visit

## 2014-11-30 ENCOUNTER — Encounter: Payer: No Typology Code available for payment source | Admitting: Internal Medicine

## 2014-11-30 ENCOUNTER — Encounter: Payer: Self-pay | Admitting: Internal Medicine

## 2014-12-01 ENCOUNTER — Other Ambulatory Visit: Payer: Self-pay | Admitting: Internal Medicine

## 2014-12-01 ENCOUNTER — Telehealth: Payer: Self-pay | Admitting: Internal Medicine

## 2014-12-01 DIAGNOSIS — M545 Low back pain, unspecified: Secondary | ICD-10-CM

## 2014-12-01 DIAGNOSIS — G8929 Other chronic pain: Secondary | ICD-10-CM

## 2014-12-01 NOTE — Telephone Encounter (Signed)
Last UDS 7/5, inappropriate results Last appt 9/27 no show, no future appt scheduled Last filled 9/2

## 2014-12-01 NOTE — Telephone Encounter (Signed)
Pt called requesting hydrocodone to be filled.  °

## 2014-12-03 NOTE — Telephone Encounter (Signed)
Please ask patient to visit clinic. No further refills at this time.

## 2014-12-03 NOTE — Telephone Encounter (Signed)
Pt has scheduled appointment 10/6

## 2014-12-09 ENCOUNTER — Encounter: Payer: Self-pay | Admitting: Internal Medicine

## 2014-12-09 ENCOUNTER — Ambulatory Visit (INDEPENDENT_AMBULATORY_CARE_PROVIDER_SITE_OTHER): Payer: No Typology Code available for payment source | Admitting: Internal Medicine

## 2014-12-09 VITALS — BP 129/85 | HR 90 | Temp 97.8°F | Ht 65.0 in | Wt 177.5 lb

## 2014-12-09 DIAGNOSIS — M778 Other enthesopathies, not elsewhere classified: Secondary | ICD-10-CM | POA: Diagnosis not present

## 2014-12-09 DIAGNOSIS — F439 Reaction to severe stress, unspecified: Secondary | ICD-10-CM

## 2014-12-09 DIAGNOSIS — Z638 Other specified problems related to primary support group: Secondary | ICD-10-CM | POA: Diagnosis not present

## 2014-12-09 DIAGNOSIS — F17210 Nicotine dependence, cigarettes, uncomplicated: Secondary | ICD-10-CM | POA: Diagnosis not present

## 2014-12-09 DIAGNOSIS — F17200 Nicotine dependence, unspecified, uncomplicated: Secondary | ICD-10-CM

## 2014-12-09 MED ORDER — IBUPROFEN 600 MG PO TABS
600.0000 mg | ORAL_TABLET | Freq: Three times a day (TID) | ORAL | Status: DC | PRN
Start: 2014-12-09 — End: 2015-04-26

## 2014-12-09 NOTE — Patient Instructions (Signed)
Christy Burke it was nice seeing you today.   - Our clinical social worker will call you to speak to you about referral to a psychologist.   -Take Ibuprofen 600 mg: 1 tablet by mouth every 8 hours as needed for wrist tendon pain

## 2014-12-11 DIAGNOSIS — M778 Other enthesopathies, not elsewhere classified: Secondary | ICD-10-CM | POA: Insufficient documentation

## 2014-12-11 NOTE — Assessment & Plan Note (Signed)
Patient was overall compliant with Nicotine 21 mg patches during the first 6 weeks of treatment. During her previous visit (10/19/14) patient was prescribed Nicotine 14 mg x 2 wks and 7 mg x 2 weeks. She should have finished her course in September. However, patient reports still having patches at home because she skipped a few weeks in between due to emotional stress. -Congratulated patient on being compliant during the first 6 weeks and encouraged her to continue doing that in the future. -Reassess at next visit.

## 2014-12-11 NOTE — Progress Notes (Signed)
Patient ID: Christy Burke, female   DOB: 10-May-1960, 54 y.o.   MRN: [id number]   Subjective:   Patient ID: Christy Burke female   DOB: 11/15/1960 54 y.o.   MRN: [id number]  HPI: [name] is a 54 y.o. F with a PMHx of asthma presenting to the clinic today with new onset left wrist pain. Please refer to A&P for further details about this problem and the patient's chronic medical problems.     No past medical history on file. Current Outpatient Prescriptions  Medication Sig Dispense Refill  . albuterol (PROVENTIL HFA;VENTOLIN HFA) 108 (90 BASE) MCG/ACT inhaler Inhale 2 puffs into the lungs every 6 (six) hours as needed for wheezing or shortness of breath. 1 Inhaler 2  . HYDROcodone-acetaminophen (NORCO) 7.5-325 MG per tablet Take 1 tablet by mouth 2 (two) times daily as needed for moderate pain. 60 tablet 0  . ibuprofen (ADVIL,MOTRIN) 600 MG tablet Take 1 tablet (600 mg total) by mouth every 8 (eight) hours as needed. 20 tablet 0  . nicotine (NICODERM CQ - DOSED IN MG/24 HOURS) 14 mg/24hr patch Place 1 patch (14 mg total) onto the skin daily. 14 patch 0  . nicotine (NICODERM CQ - DOSED IN MG/24 HOURS) 21 mg/24hr patch Place 1 patch (21 mg total) onto the skin daily. 42 patch 0  . nicotine (NICODERM CQ - DOSED IN MG/24 HR) 7 mg/24hr patch Place 1 patch (7 mg total) onto the skin daily. 14 patch 0   No current facility-administered medications for this visit.   Family History  Problem Relation Age of Onset  . Cancer Mother   . Diabetes Father   . Colon cancer Neg Hx    Social History   Social History  . Marital Status: Single    Spouse Name: N/A  . Number of Children: N/A  . Years of Education: N/A   Social History Main Topics  . Smoking status: Current Every Day Smoker -- 1.00 packs/day    Types: Cigarettes  . Smokeless tobacco: Never Used     Comment: Has set a day.  . 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 Drug  Use: No     Comment: Quit several years ago, never did IVDU only smoked cocaine  . Sexual Activity: Yes     Comment: In same sex relationship for 19 years   Other Topics Concern  . None   Social History Narrative   Review of Systems: Review of Systems  Constitutional: Negative for fever, chills and malaise/fatigue.  HENT: Negative for ear pain.   Eyes: Negative for blurred vision and pain.  Respiratory: Negative for cough, shortness of breath and wheezing.   Cardiovascular: Negative for chest pain, palpitations and leg swelling.  Gastrointestinal: Negative for nausea, vomiting, abdominal pain, diarrhea and constipation.  Genitourinary: Negative for dysuria, urgency and frequency.  Musculoskeletal: Negative for myalgias and joint pain.  Skin: Negative for itching and rash.  Neurological: Negative for dizziness, tingling, sensory change, focal weakness and headaches.   Objective:  Physical Exam: Filed Vitals:   12/09/14 1105  BP: 129/85  Pulse: 90  Temp: 97.8 F (36.6 C)  TempSrc: Oral  Height: 5\' 5"  (1.651 m)  Weight: 177 lb 8 oz (80.513 kg)  SpO2: 100%   Physical Exam  Constitutional: She is oriented to person, place, and time. She appears well-developed and well-nourished. No distress.  HENT:  Head: Normocephalic and atraumatic.  Eyes:  EOM are normal. Pupils are equal, round, and reactive to light.  Neck: Neck supple. No tracheal deviation present.  Cardiovascular: Normal rate, regular rhythm and intact distal pulses.   Pulmonary/Chest: Effort normal. No respiratory distress. She has no wheezes. She has no rales.  Abdominal: Soft. Bowel sounds are normal. She exhibits no distension. There is no tenderness.  Musculoskeletal: She exhibits no edema.  L wrist: Normal ROM. Mild tenderness on palpation of the dorsum of the wrist. Phalen and Tinel's signs negative.   Neurological: She is alert and oriented to person, place, and time.  Skin: Skin is warm and dry.    Assessment & Plan:

## 2014-12-11 NOTE — Assessment & Plan Note (Signed)
Patient reports 2-3 day history of mild pain on the dorsum of her left wrist, especially with movement. Denies any fevers, rash, or joint pain. Denies any numbness, tingling, or shooting pain in her forearm, wrist, or hand. 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 Phalen and Tinel's sign negative. Physical exam showing pain with wrist movement and mild tenderness on palpation of the dorsum of her wrist. Symptoms likely due to tendonitis. -Ibuprofen 600 mg q8 prn -Wrist splint

## 2014-12-11 NOTE — Assessment & Plan Note (Signed)
Patient states she did go to a counselor via her workplace, however, it requesting a referral for further therapy. States today is her mom's death anniversary and she misses her. States she needs someone to talk to because her spouse does not like to talk. At present, her PHQ-9 score is 6 and GAD-7 score is 5. She denies any suicidal or homicidal ideation.  -Patient referred to social worker Immunologist for discuss available options (counselor vs psychologist) -Reassess symptoms via these questionnaires at next visit

## 2014-12-13 ENCOUNTER — Ambulatory Visit: Payer: No Typology Code available for payment source | Admitting: Internal Medicine

## 2014-12-13 ENCOUNTER — Telehealth: Payer: Self-pay | Admitting: Licensed Clinical Social Worker

## 2014-12-13 NOTE — Telephone Encounter (Signed)
CSW placed call to DART member services to confirm benefit for mental health services.  Pt must call number on top of card to obtain in-network providers, this number is cut off on scanned copy of card. Pt's in-network benefits 80% for in-network with max out of pocket $1100, max out of pocket for OON $1500.  Pt has already met deductible for either in/OON providers.  CSW will follow up with pt on next business day.  Pt also has EAP services available. 

## 2014-12-14 ENCOUNTER — Other Ambulatory Visit: Payer: Self-pay | Admitting: Internal Medicine

## 2014-12-14 DIAGNOSIS — M545 Low back pain, unspecified: Secondary | ICD-10-CM

## 2014-12-14 DIAGNOSIS — G8929 Other chronic pain: Secondary | ICD-10-CM

## 2014-12-14 MED ORDER — HYDROCODONE-ACETAMINOPHEN 7.5-325 MG PO TABS: 1.0000 | ORAL_TABLET | Freq: Two times a day (BID) | ORAL | Status: DC | PRN

## 2014-12-14 NOTE — Progress Notes (Signed)
Medicine attending: I personally interviewed and briefly examined this patient, and reviewed pertinent clinical laboratory and radiographic data  with resident physician Dr.Vasu Rathore on the day of the patient visit and we discussed a   mnagement plan.

## 2014-12-14 NOTE — Telephone Encounter (Signed)
Last OV: 10/6 Last UDS: 7/5 Last refill: 9/2

## 2014-12-14 NOTE — Telephone Encounter (Signed)
PT REQUESTING PAIN MED TO BE REFILLED

## 2014-12-15 NOTE — Telephone Encounter (Signed)
Pt informed Rx is ready 

## 2014-12-16 NOTE — Telephone Encounter (Signed)
CSW obtained list of in-network providers utilizing insurance provider directory.  In addition, CSW will provide information and support services available through Hospice.  CSW placed called to pt.  CSW left message requesting return call. CSW provided contact hours and phone number.

## 2014-12-21 ENCOUNTER — Ambulatory Visit (INDEPENDENT_AMBULATORY_CARE_PROVIDER_SITE_OTHER): Payer: No Typology Code available for payment source | Admitting: Internal Medicine

## 2014-12-21 ENCOUNTER — Encounter: Payer: Self-pay | Admitting: Internal Medicine

## 2014-12-21 VITALS — BP 120/85 | HR 92 | Temp 97.9°F | Ht 65.0 in | Wt 176.4 lb

## 2014-12-21 DIAGNOSIS — M25561 Pain in right knee: Secondary | ICD-10-CM

## 2014-12-21 DIAGNOSIS — M545 Low back pain, unspecified: Secondary | ICD-10-CM

## 2014-12-21 DIAGNOSIS — M25569 Pain in unspecified knee: Secondary | ICD-10-CM

## 2014-12-21 DIAGNOSIS — G8929 Other chronic pain: Secondary | ICD-10-CM | POA: Diagnosis not present

## 2014-12-21 DIAGNOSIS — M25562 Pain in left knee: Secondary | ICD-10-CM

## 2014-12-21 MED ORDER — DICLOFENAC SODIUM 1 % TD GEL: 2.0000 g | Freq: Four times a day (QID) | TRANSDERMAL | Status: DC

## 2014-12-21 NOTE — Telephone Encounter (Signed)
CSW met with [name] following her scheduled [facility] appointment.  CSW discussed free community services provided by hospice as well as providers available through insurance.  CSW provided [name] with listing of [employer] and also encouraged pt to utilize all EAP benefits through employer as those are usually free.  Pt states she will speak with her employer this afternoon. Pt denies add'l social work needs at this time.  [name] has CSW contact information and is aware CSW is available as needed.

## 2014-12-21 NOTE — Patient Instructions (Addendum)
Ms. Kofoed it was nice seeing you today.  -Please you Voltaren gel 4 times a day for the pain in your lower back and both knees.   -I have referred you to physical therapy today. Our office will call you with an appointment date.   -I have also ordered x-rays of your back and both knees. Make sure you get them done this week. Our office will make arrangements for you.   -Have a nice day.

## 2014-12-23 DIAGNOSIS — M25569 Pain in unspecified knee: Secondary | ICD-10-CM

## 2014-12-23 DIAGNOSIS — G8929 Other chronic pain: Secondary | ICD-10-CM | POA: Insufficient documentation

## 2014-12-23 NOTE — Progress Notes (Signed)
Patient ID: Dalilah Curlin, female   DOB: August 27, 1960, 54 y.o.   MRN: [id number]   Subjective:   Patient ID: Belky Mundo female   DOB: 10-28-1960 54 y.o.   MRN: [id number]  HPI: Ms.Sedona Barna is a 54 y.o. F with a PMHx of conditions listed below presenting to the clinic to discuss her chronic lower back pain. Please see A&P for the status of the patient's chronic medical conditions.     No past medical history on file. Current Outpatient Prescriptions  Medication Sig Dispense Refill  . albuterol (PROVENTIL HFA;VENTOLIN HFA) 108 (90 BASE) MCG/ACT inhaler Inhale 2 puffs into the lungs every 6 (six) hours as needed for wheezing or shortness of breath. 1 Inhaler 2  . diclofenac sodium (VOLTAREN) 1 % GEL Apply 2 g topically 4 (four) times daily. 100 g 3  . HYDROcodone-acetaminophen (NORCO) 7.5-325 MG tablet Take 1 tablet by mouth 2 (two) times daily as needed for moderate pain. 30 tablet 0  . ibuprofen (ADVIL,MOTRIN) 600 MG tablet Take 1 tablet (600 mg total) by mouth every 8 (eight) hours as needed. 20 tablet 0  . nicotine (NICODERM CQ - DOSED IN MG/24 HOURS) 14 mg/24hr patch Place 1 patch (14 mg total) onto the skin daily. 14 patch 0  . nicotine (NICODERM CQ - DOSED IN MG/24 HOURS) 21 mg/24hr patch Place 1 patch (21 mg total) onto the skin daily. 42 patch 0  . nicotine (NICODERM CQ - DOSED IN MG/24 HR) 7 mg/24hr patch Place 1 patch (7 mg total) onto the skin daily. 14 patch 0   No current facility-administered medications for this visit.   Family History  Problem Relation Age of Onset  . Cancer Mother   . Diabetes Father   . Colon cancer Neg Hx    Social History   Social History  . Marital Status: Single    Spouse Name: N/A  . Number of Children: N/A  . Years of Education: N/A   Social History Main Topics  . Smoking status: Current Every Day Smoker -- 1.00 packs/day    Types: Cigarettes  . Smokeless tobacco: Never Used     Comment: Has set a day.  . 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 Drug Use: No     Comment: Quit several years ago, never did IVDU only smoked cocaine  . Sexual Activity: Yes     Comment: In same sex relationship for 19 years   Other Topics Concern  . None   Social History Narrative   Review of Systems: Review of Systems  Constitutional: Negative for fever and chills.  HENT: Negative for ear pain.   Eyes: Negative for blurred vision and pain.  Respiratory: Negative for cough, shortness of breath and wheezing.   Cardiovascular: Negative for chest pain and leg swelling.  Gastrointestinal: Negative for nausea, vomiting and abdominal pain.  Genitourinary: Negative for dysuria, urgency and frequency.  Musculoskeletal: Positive for back pain and joint pain. Negative for myalgias.  Skin: Negative for itching and rash.  Neurological: Negative for dizziness, speech change, focal weakness and headaches.   Objective:  Physical Exam: Filed Vitals:   12/21/14 0919  BP: 120/85  Pulse: 92  Temp: 97.9 F (36.6 C)  TempSrc: Oral  Height: 5\' 5"  (1.651 m)  Weight: 176 lb 6.4 oz (80.015 kg)  SpO2: 100%   Physical Exam  Constitutional: She is oriented to person, place, and time. She appears well-developed and  well-nourished. No distress.  HENT:  Head: Normocephalic and atraumatic.  Eyes: EOM are normal. Pupils are equal, round, and reactive to light.  Neck: Normal range of motion. Neck supple. No tracheal deviation present.  Cardiovascular: Normal rate, regular rhythm and intact distal pulses.   No murmur heard. Pulmonary/Chest: Effort normal and breath sounds normal. No respiratory distress. She has no wheezes. She has no rales.  Abdominal: Soft. Bowel sounds are normal. She exhibits no distension. There is no tenderness.  Musculoskeletal: Normal range of motion. She exhibits no edema or tenderness.  Neurological: She is alert and oriented to person, place, and time.  Skin:  Skin is warm and dry.   Assessment & Plan:

## 2014-12-23 NOTE — Assessment & Plan Note (Addendum)
I called the patient into the clinic because during her previous two visits she reported feeling well and denied having any pain anywhere. Stated she was physically very active and lifting weights at the gym. However, each time after leaving the clinic she was sending refill requests for Norco. The requests were honored but then I thought it would be appropriate to call the patient and discuss why she still needs Norco. During one of her previous visits on 09/08/14 when asked about how she took [place], patient's story was inconsistent and raised red flags. At this visit, patient states she continues to have chronic lower back pain for the past 1 year and bilateral knee pain R>L for the past 3 years. States she needs Norco for both these chronic pains. Denies any radiation of the back pain to the legs. Denies any motor or sensory loss. Denies any alarm symptoms such as urinary or fecal incontinence. Physical exam revealed normal ROM and no pain on palpation of spine or knees. Straight leg raise test negative. Strength and sensation intact in b/l upper and lower extremities. Xrays done in 06/2013 showing normal lumbar spine and thoracic spine with mild degenerative disc disease. Xray of L knee done in 05/2008 showing mild degenerative joint disease. [place] controlled sybstance database showing patient has been getting her Norco 7.5- 325 mg 1 tab BID, #60 filled each month consistently. However, at this visit when asked about how she takes the medication, she reported taking 1 tablet a day 5-6 days a week and 2 tablets on her one day off from work. She last received Norco #30 pills on 12/14/14   -UDS ordered. Pt states she last took Norco on 12/19/14 in the evening, meaning <48 hrs before this visit.  -Xray of lumbar spine -X ray of knees -Voltaren gel QID -If UDS is appropriate, give Norco based on how much the patient is actually using - 9 pills maximum in a week or #36 maximum in a month.   Addendum  (12/27/14 at 10:53 am): UDS inappropriate (no hydrocodone or metabolites detected). No further controlled pain medications from [facility].

## 2014-12-23 NOTE — Assessment & Plan Note (Signed)
Please refer to note on chronic back pain.

## 2014-12-24 NOTE — Progress Notes (Signed)
Internal Medicine Clinic Attending  I saw and evaluated the patient.  I personally confirmed the key portions of the history and exam documented by Dr. Marlowe Sax and I reviewed pertinent patient test results.  The assessment, diagnosis, and plan were formulated together and I agree with the documentation in the resident's note. I agree with Dr Marlowe Sax that there are red flags. She gets 60 every 30 days. But takes one before bed (works nights so sleeps during day) but many nights, she falls asleep before taking her hydrocodone. On weekends she might take 2 per day. So likely 6 or 7 per week, less than 30 per month. But was unable to explain where the extra 30 per month went to. Agree with decreasing to 30 per month, UDS, alternative pain tx, and F/U monthly while cont to consider further wean.

## 2014-12-26 LAB — TOXASSURE SELECT,+ANTIDEPR,UR: PDF: 0

## 2015-01-04 ENCOUNTER — Encounter: Payer: Self-pay | Admitting: *Deleted

## 2015-01-13 ENCOUNTER — Encounter (HOSPITAL_COMMUNITY): Payer: Self-pay | Admitting: Emergency Medicine

## 2015-01-13 ENCOUNTER — Emergency Department (HOSPITAL_COMMUNITY): Payer: No Typology Code available for payment source

## 2015-01-13 ENCOUNTER — Emergency Department (HOSPITAL_COMMUNITY)
Admission: EM | Admit: 2015-01-13 | Discharge: 2015-01-14 | Disposition: A | Discharge status: Home / Self Care | Payer: No Typology Code available for payment source | Attending: Emergency Medicine | Admitting: Emergency Medicine

## 2015-01-13 DIAGNOSIS — Z3202 Encounter for pregnancy test, result negative: Secondary | ICD-10-CM | POA: Insufficient documentation

## 2015-01-13 DIAGNOSIS — F17210 Nicotine dependence, cigarettes, uncomplicated: Secondary | ICD-10-CM | POA: Insufficient documentation

## 2015-01-13 DIAGNOSIS — Z79899 Other long term (current) drug therapy: Secondary | ICD-10-CM | POA: Diagnosis not present

## 2015-01-13 DIAGNOSIS — N200 Calculus of kidney: Secondary | ICD-10-CM | POA: Diagnosis not present

## 2015-01-13 DIAGNOSIS — Z9889 Other specified postprocedural states: Secondary | ICD-10-CM | POA: Insufficient documentation

## 2015-01-13 DIAGNOSIS — R109 Unspecified abdominal pain: Secondary | ICD-10-CM | POA: Diagnosis present

## 2015-01-13 HISTORY — DX: Calculus of kidney: N20.0

## 2015-01-13 LAB — CBC
HCT: 37.9 % (ref 36.0–46.0)
HEMOGLOBIN: 12.2 g/dL (ref 12.0–15.0)
MCH: 25.5 pg — AB (ref 26.0–34.0)
MCHC: 32.2 g/dL (ref 30.0–36.0)
MCV: 79.3 fL (ref 78.0–100.0)
Platelets: 222 10*3/uL (ref 150–400)
RBC: 4.78 MIL/uL (ref 3.87–5.11)
RDW: 14.8 % (ref 11.5–15.5)
WBC: 7.5 10*3/uL (ref 4.0–10.5)

## 2015-01-13 LAB — COMPREHENSIVE METABOLIC PANEL WITH GFR
ALT: 60 U/L — ABNORMAL HIGH (ref 14–54)
AST: 38 U/L (ref 15–41)
Albumin: 3.6 g/dL (ref 3.5–5.0)
Alkaline Phosphatase: 75 U/L (ref 38–126)
Anion gap: 6 (ref 5–15)
BUN: 7 mg/dL (ref 6–20)
CO2: 27 mmol/L (ref 22–32)
Calcium: 9.4 mg/dL (ref 8.9–10.3)
Chloride: 104 mmol/L (ref 101–111)
Creatinine, Ser: 0.65 mg/dL (ref 0.44–1.00)
GFR calc Af Amer: >60 mL/min
GFR calc non Af Amer: >60 mL/min
Glucose, Bld: 109 mg/dL — ABNORMAL HIGH (ref 65–99)
Potassium: 3.9 mmol/L (ref 3.5–5.1)
Sodium: 137 mmol/L (ref 135–145)
Total Bilirubin: 0.2 mg/dL — ABNORMAL LOW (ref 0.3–1.2)
Total Protein: 6.2 g/dL — ABNORMAL LOW (ref 6.5–8.1)

## 2015-01-13 LAB — URINALYSIS, ROUTINE W REFLEX MICROSCOPIC
Bilirubin Urine: NEGATIVE
GLUCOSE, UA: NEGATIVE mg/dL
Ketones, ur: NEGATIVE mg/dL
LEUKOCYTES UA: NEGATIVE
NITRITE: NEGATIVE
PH: 5 (ref 5.0–8.0)
Protein, ur: NEGATIVE mg/dL
SPECIFIC GRAVITY, URINE: 1.02 (ref 1.005–1.030)
Urobilinogen, UA: 0.2 mg/dL (ref 0.0–1.0)

## 2015-01-13 LAB — URINE MICROSCOPIC-ADD ON

## 2015-01-13 LAB — POC URINE PREG, ED: Preg Test, Ur: NEGATIVE

## 2015-01-13 MED ORDER — MORPHINE SULFATE (PF) 4 MG/ML IV SOLN
4.0000 mg | Freq: Once | INTRAVENOUS | Status: AC
  Administered 2015-01-13: 4 mg via INTRAVENOUS
  Filled 2015-01-13: qty 1

## 2015-01-13 NOTE — ED Notes (Addendum)
Patient transported to CT 

## 2015-01-13 NOTE — ED Notes (Signed)
Pt. reports sudden onset right flank pain with nausea and emesis onset this evening , denies hematuria or injury . No fever or chills.

## 2015-01-13 NOTE — ED Provider Notes (Signed)
CSN: [id number]     Arrival date & time 01/13/15  2130 History   First MD Initiated Contact with Patient 01/13/15 2224     Chief Complaint  Patient presents with  . Flank Pain     (Consider location/radiation/quality/duration/timing/severity/associated sxs/prior Treatment) Patient is a 54 y.o. female presenting with flank pain. The history is provided by the patient.  Flank Pain This is a new problem. The current episode started today. The problem occurs constantly. The problem has been unchanged. Associated symptoms include nausea and vomiting. Pertinent negatives include no chills, fever, rash or urinary symptoms. The symptoms are aggravated by bending, standing, twisting and walking. She has tried nothing for the symptoms.   Patient is a 54yo female with history of nephrolithiasis s/p lithotrypsy presenting with acute onset right flank pain. Patient reports that she was in her normal state of health until around 8:30pm this evening when she noticed intense, sharp right flank pain. Pain does not radiate. Patient says that her current pain is more severe than when she previously had kidney stones. Patient took two tylenol which did not improve the pain. Pain is worse with any sort of movement. Patient denies any dysuria, fevers, or chills. No hematuria noted. Patient had nausea and vomiting when she first experienced the pain, which has since subsided.   Past Medical History  Diagnosis Date  . Renal stones    Past Surgical History  Procedure Laterality Date  . Umbilical hernia repair  1990  . Lithotrypsy     Family History  Problem Relation Age of Onset  . Cancer Mother   . Diabetes Father   . Colon cancer Neg Hx    Social History  Substance Use Topics  . Smoking status: Current Every Day Smoker -- 1.00 packs/day    Types: Cigarettes  . Smokeless tobacco: Never Used     Comment: Has set a day.  . Alcohol Use: No     Comment: Quit several years ago;recovering alcoholic; no  alcohol in 3 years   OB History    No data available     Review of Systems  Constitutional: Negative for fever and chills.  HENT: Negative.   Eyes: Negative.   Respiratory: Negative.   Cardiovascular: Negative.   Gastrointestinal: Positive for nausea and vomiting.  Endocrine: Negative.   Genitourinary: Positive for flank pain. Negative for dysuria and hematuria.  Musculoskeletal: Negative.   Skin: Negative for rash.  Allergic/Immunologic: Negative.   Neurological: Negative.   Hematological: Negative.   Psychiatric/Behavioral: Negative.       Allergies  Review of patient's allergies indicates no known allergies.  Home Medications   Prior to Admission medications   Medication Sig Start Date End Date Taking? Authorizing Provider  ibuprofen (ADVIL,MOTRIN) 200 MG tablet Take 200 mg by mouth every 6 (six) hours as needed for moderate pain.   Yes Historical Provider, MD  albuterol (PROVENTIL HFA;VENTOLIN HFA) 108 (90 BASE) MCG/ACT inhaler Inhale 2 puffs into the lungs every 6 (six) hours as needed for wheezing or shortness of breath. Patient not taking: Reported on 01/13/2015 06/28/14   Gregor Hams, MD  diclofenac sodium (VOLTAREN) 1 % GEL Apply 2 g topically 4 (four) times daily. Patient not taking: Reported on 01/13/2015 12/21/14   Shela Leff, MD  HYDROcodone-acetaminophen (NORCO) 7.5-325 MG tablet Take 1 tablet by mouth 2 (two) times daily as needed for moderate pain. Patient not taking: Reported on 01/13/2015 12/14/14   Shela Leff, MD  ibuprofen (ADVIL,MOTRIN) 600 MG tablet  Take 1 tablet (600 mg total) by mouth every 8 (eight) hours as needed. Patient not taking: Reported on 01/13/2015 12/09/14   Shela Leff, MD  nicotine (NICODERM CQ - DOSED IN MG/24 HOURS) 14 mg/24hr patch Place 1 patch (14 mg total) onto the skin daily. Patient not taking: Reported on 01/13/2015 10/19/14   Shela Leff, MD  nicotine (NICODERM CQ - DOSED IN MG/24 HOURS) 21 mg/24hr  patch Place 1 patch (21 mg total) onto the skin daily. Patient not taking: Reported on 01/13/2015 09/07/14   Shela Leff, MD  nicotine (NICODERM CQ - DOSED IN MG/24 HR) 7 mg/24hr patch Place 1 patch (7 mg total) onto the skin daily. Patient not taking: Reported on 01/13/2015 10/19/14   Shela Leff, MD  oxyCODONE-acetaminophen (PERCOCET/ROXICET) 5-325 MG tablet Take 1-2 tablets by mouth every 4 (four) hours as needed for severe pain. 01/14/15   Vivi Barrack, MD   BP 127/96 mmHg  Pulse 90  Temp(Src) 98.2 F (36.8 C) (Oral)  Resp 14  SpO2 98% Physical Exam  Constitutional: She is oriented to person, place, and time. She appears well-developed and well-nourished.  Appearing in pain  HENT:  Head: Normocephalic and atraumatic.  Eyes: EOM are normal. Pupils are equal, round, and reactive to light.  Neck: Normal range of motion. Neck supple.  Cardiovascular: Normal rate, regular rhythm and normal heart sounds.   Pulmonary/Chest: Effort normal and breath sounds normal. No respiratory distress.  Abdominal: Soft. Bowel sounds are normal. She exhibits no distension. There is no tenderness. There is CVA tenderness.  Musculoskeletal: Normal range of motion. She exhibits no edema.  Neurological: She is alert and oriented to person, place, and time. No cranial nerve deficit.  Skin: Skin is warm and dry. No rash noted.  Psychiatric: She has a normal mood and affect. Her behavior is normal.  Nursing note and vitals reviewed.   ED Course  Procedures (including critical care time) Labs Review Labs Reviewed  COMPREHENSIVE METABOLIC PANEL - Abnormal; Notable for the following:    Glucose, Bld 109 (*)    Total Protein 6.2 (*)    ALT 60 (*)    Total Bilirubin 0.2 (*)    All other components within normal limits  CBC - Abnormal; Notable for the following:    MCH 25.5 (*)    All other components within normal limits  URINALYSIS, ROUTINE W REFLEX MICROSCOPIC (NOT AT [facility]) - Abnormal;  Notable for the following:    APPearance CLOUDY (*)    Hgb urine dipstick MODERATE (*)    All other components within normal limits  URINE MICROSCOPIC-ADD ON - Abnormal; Notable for the following:    Squamous Epithelial / LPF FEW (*)    All other components within normal limits  POC URINE PREG, ED    Imaging Review Ct Renal Stone Study  01/13/2015  CLINICAL DATA:  Right flank pain and hematuria. Nausea and vomiting. History of renal stones. EXAM: CT ABDOMEN AND PELVIS WITHOUT CONTRAST TECHNIQUE: Multidetector CT imaging of the abdomen and pelvis was performed following the standard protocol without IV contrast. COMPARISON:  CT 02/13/2013 FINDINGS: Lower chest: Subsegmental atelectasis periphery of the left lower lobe. Liver: No focal lesion allowing for lack of contrast. Hepatobiliary: Gallbladder physiologically distended, no calcified stone. No biliary dilatation. Pancreas:  Normal noncontrast appearance. Spleen: Normal. Adrenal glands: No nodule. Kidneys: Mild right hydroureteronephrosis. The right ureter is dilated to an apparent punctate stone in the mid distal ureter, however normal anatomy distorted by large fibroid  uterus. No left hydronephrosis. Nonobstructing stones are seen in both kidneys. Stomach/Bowel: Stomach physiologically distended. There are no dilated or thickened small bowel loops. Moderate volume of stool throughout the colon without colonic wall thickening. There a few diverticula involving the right colon containing retained barium. The appendix is normal. Vascular/Lymphatic: No retroperitoneal adenopathy. Abdominal aorta is normal in caliber. Mild atherosclerosis without aneurysm. Reproductive: Enlarged heterogeneous uterus with popcorn calcifications consistent with fibroids. Uterus measures 18 cm in sagittal dimension. Overall increase in calcified and portions from prior. Bladder: Physiologically distended.  No wall thickening. Other: Minimal free fluid in the pelvic  cul-de-sac. No intra-abdominal ascites. No free air or intra-abdominal fluid collection. Musculoskeletal: There are no acute or suspicious osseous abnormalities. Degenerative disc disease and facet arthropathy at L5-S1. IMPRESSION: 1. Punctate obstructing stone in the mid distal right ureter with mild resultant hydroureteronephrosis. Nonobstructing stones in both kidneys. 2. Enlarged heterogeneous fibroid uterus. Increase in calcified portion from prior, size overall unchanged. Electronically Signed   By: Jeb Levering M.D.   On: 01/13/2015 23:52   I have personally reviewed and evaluated these images and lab results as part of my medical decision-making.   MDM   Final diagnoses:  Nephrolithiasis   10:49 PM 53yo female with history of nephrolithiasis presenting with acute onset right flank pain. Hematuria noted on UA. No signs or symptoms of UTI/pyelonephritis. Will obtain CT stone study. Offered pain medication, however patient refused.   12:12 AM CT stone study shows punctate stone in mid distal right ureter and nonobstructing stones in both kidneys. Will give 1mg  of dilaudid and 15mg  of toradol. Will discharge home with prescription for percocet. Instructed patient to follow up with her urologist.  Vivi Barrack, MD 01/14/15 [id number]  Leonard Schwartz, MD 01/20/15 [phone]

## 2015-01-14 MED ORDER — KETOROLAC TROMETHAMINE 30 MG/ML IJ SOLN
15.0000 mg | Freq: Once | INTRAMUSCULAR | Status: AC
  Administered 2015-01-14: 15 mg via INTRAVENOUS
  Filled 2015-01-14: qty 1

## 2015-01-14 MED ORDER — HYDROMORPHONE HCL 1 MG/ML IJ SOLN
1.0000 mg | Freq: Once | INTRAMUSCULAR | Status: AC
Start: 2015-01-14 — End: 2015-01-14
  Administered 2015-01-14: 1 mg via INTRAVENOUS
  Filled 2015-01-14: qty 1

## 2015-01-14 MED ORDER — OXYCODONE-ACETAMINOPHEN 5-325 MG PO TABS: 1.0000 | ORAL_TABLET | ORAL | Status: DC | PRN

## 2015-01-14 NOTE — Discharge Instructions (Signed)
Kidney Stones °Kidney stones (urolithiasis) are deposits that form inside your kidneys. The intense pain is caused by the stone moving through the urinary tract. When the stone moves, the ureter goes into spasm around the stone. The stone is usually passed in the urine.  °CAUSES  °· A disorder that makes certain neck glands produce too much parathyroid hormone (primary hyperparathyroidism). °· A buildup of uric acid crystals, similar to gout in your joints. °· Narrowing (stricture) of the ureter. °· A kidney obstruction present at birth (congenital obstruction). °· Previous surgery on the kidney or ureters. °· Numerous kidney infections. °SYMPTOMS  °· Feeling sick to your stomach (nauseous). °· Throwing up (vomiting). °· Blood in the urine (hematuria). °· Pain that usually spreads (radiates) to the groin. °· Frequency or urgency of urination. °DIAGNOSIS  °· Taking a history and physical exam. °· Blood or urine tests. °· CT scan. °· Occasionally, an examination of the inside of the urinary bladder (cystoscopy) is performed. °TREATMENT  °· Observation. °· Increasing your fluid intake. °· Extracorporeal shock wave lithotripsy--This is a noninvasive procedure that uses shock waves to break up kidney stones. °· Surgery may be needed if you have severe pain or persistent obstruction. There are various surgical procedures. Most of the procedures are performed with the use of small instruments. Only small incisions are needed to accommodate these instruments, so recovery time is minimized. °The size, location, and chemical composition are all important variables that will determine the proper choice of action for you. Talk to your health care provider to better understand your situation so that you will minimize the risk of injury to yourself and your kidney.  °HOME CARE INSTRUCTIONS  °· Drink enough water and fluids to keep your urine clear or pale yellow. This will help you to pass the stone or stone fragments. °· Strain  all urine through the provided strainer. Keep all particulate matter and stones for your health care provider to see. The stone causing the pain may be as small as a grain of salt. It is very important to use the strainer each and every time you pass your urine. The collection of your stone will allow your health care provider to analyze it and verify that a stone has actually passed. The stone analysis will often identify what you can do to reduce the incidence of recurrences. °· Only take over-the-counter or prescription medicines for pain, discomfort, or fever as directed by your health care provider. °· Keep all follow-up visits as told by your health care provider. This is important. °· Get follow-up X-rays if required. The absence of pain does not always mean that the stone has passed. It may have only stopped moving. If the urine remains completely obstructed, it can cause loss of kidney function or even complete destruction of the kidney. It is your responsibility to make sure X-rays and follow-ups are completed. Ultrasounds of the kidney can show blockages and the status of the kidney. Ultrasounds are not associated with any radiation and can be performed easily in a matter of minutes. °· Make changes to your daily diet as told by your health care provider. You may be told to: °¨ Limit the amount of salt that you eat. °¨ Eat 5 or more servings of fruits and vegetables each day. °¨ Limit the amount of meat, poultry, fish, and eggs that you eat. °· Collect a 24-hour urine sample as told by your health care provider. You may need to collect another urine sample every 6-12   months. °SEEK MEDICAL CARE IF: °· You experience pain that is progressive and unresponsive to any pain medicine you have been prescribed. °SEEK IMMEDIATE MEDICAL CARE IF:  °· Pain cannot be controlled with the prescribed medicine. °· You have a fever or shaking chills. °· The severity or intensity of pain increases over 18 hours and is not  relieved by pain medicine. °· You develop a new onset of abdominal pain. °· You feel faint or pass out. °· You are unable to urinate. °  °This information is not intended to replace advice given to you by your health care provider. Make sure you discuss any questions you have with your health care provider. °  °Document Released: 02/19/2005 Document Revised: 11/10/2014 Document Reviewed: 07/23/2012 °Elsevier Interactive Patient Education ©2016 [employer] ° °

## 2015-01-17 ENCOUNTER — Ambulatory Visit: Payer: No Typology Code available for payment source | Admitting: Physical Therapy

## 2015-02-04 ENCOUNTER — Ambulatory Visit: Payer: No Typology Code available for payment source | Admitting: Internal Medicine

## 2015-02-07 ENCOUNTER — Ambulatory Visit (INDEPENDENT_AMBULATORY_CARE_PROVIDER_SITE_OTHER): Payer: No Typology Code available for payment source | Admitting: Internal Medicine

## 2015-02-07 ENCOUNTER — Encounter: Payer: Self-pay | Admitting: Internal Medicine

## 2015-02-07 VITALS — BP 139/74 | HR 98 | Temp 98.0°F | Wt 183.9 lb

## 2015-02-07 DIAGNOSIS — F17200 Nicotine dependence, unspecified, uncomplicated: Secondary | ICD-10-CM

## 2015-02-07 DIAGNOSIS — D259 Leiomyoma of uterus, unspecified: Secondary | ICD-10-CM | POA: Diagnosis not present

## 2015-02-07 DIAGNOSIS — Z0000 Encounter for general adult medical examination without abnormal findings: Secondary | ICD-10-CM

## 2015-02-07 NOTE — Assessment & Plan Note (Signed)
-   Hep C screening ordered today - HIV non-reactive in July.  Since patients same-sex partner has HIV, screening for HIV antibody recommended every 3-6 months.  Recheck at next office visit. - Declined flu and pneumonia - Last pap 2013 was normal.  Referral placed today for ob/gyn

## 2015-02-07 NOTE — Assessment & Plan Note (Signed)
Assessment: Patient has history of smoking 3/4 to 1 pack per day for approximately 40 years.  She has tried patches and nicotine gum in the past to try to cut back on her smoking.  She reports that she has cut back but does continue to smoke.  She reports that she does not currently need any further support from [place] and that it is about will power at this point  Plan: - Congratulated on cutting back - Continue to reassess at every office visit - Consider screening with low dose CT

## 2015-02-07 NOTE — Progress Notes (Signed)
Patient ID: Tramika Grange, female   DOB: August 17, 1960, 54 y.o.   MRN: [id number]   Subjective:   Patient ID: Latiqua Grandpre female   DOB: 01-Oct-1960 67 y.o.   MRN: [id number]  HPI: Ms.Merridy Erby is a 54 y.o. woman with past medical history of renal stones who presents today for requesting referral to Ob/Gyn for evaluation of uterine fibroids.  Please see problem list for current status of patient chronic medical conditions.    Past Medical History  Diagnosis Date  . Renal stones    Current Outpatient Prescriptions  Medication Sig Dispense Refill  . albuterol (PROVENTIL HFA;VENTOLIN HFA) 108 (90 BASE) MCG/ACT inhaler Inhale 2 puffs into the lungs every 6 (six) hours as needed for wheezing or shortness of breath. (Patient not taking: Reported on 01/13/2015) 1 Inhaler 2  . diclofenac sodium (VOLTAREN) 1 % GEL Apply 2 g topically 4 (four) times daily. (Patient not taking: Reported on 01/13/2015) 100 g 3  . HYDROcodone-acetaminophen (NORCO) 7.5-325 MG tablet Take 1 tablet by mouth 2 (two) times daily as needed for moderate pain. (Patient not taking: Reported on 01/13/2015) 30 tablet 0  . ibuprofen (ADVIL,MOTRIN) 200 MG tablet Take 200 mg by mouth every 6 (six) hours as needed for moderate pain.    Marland Kitchen ibuprofen (ADVIL,MOTRIN) 600 MG tablet Take 1 tablet (600 mg total) by mouth every 8 (eight) hours as needed. (Patient not taking: Reported on 01/13/2015) 20 tablet 0  . nicotine (NICODERM CQ - DOSED IN MG/24 HOURS) 14 mg/24hr patch Place 1 patch (14 mg total) onto the skin daily. (Patient not taking: Reported on 01/13/2015) 14 patch 0  . nicotine (NICODERM CQ - DOSED IN MG/24 HOURS) 21 mg/24hr patch Place 1 patch (21 mg total) onto the skin daily. (Patient not taking: Reported on 01/13/2015) 42 patch 0  . nicotine (NICODERM CQ - DOSED IN MG/24 HR) 7 mg/24hr patch Place 1 patch (7 mg total) onto the skin daily. (Patient not taking: Reported on 01/13/2015) 14 patch 0  . oxyCODONE-acetaminophen  (PERCOCET/ROXICET) 5-325 MG tablet Take 1-2 tablets by mouth every 4 (four) hours as needed for severe pain. 30 tablet 0   No current facility-administered medications for this visit.   Family History  Problem Relation Age of Onset  . Cancer Mother   . Diabetes Father   . Colon cancer Neg Hx    Social History   Social History  . Marital Status: Single    Spouse Name: N/A  . Number of Children: N/A  . Years of Education: N/A   Social History Main Topics  . Smoking status: Current Every Day Smoker -- 1.00 packs/day    Types: Cigarettes  . Smokeless tobacco: Never Used     Comment: Has set a day.  . 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 Drug Use: No     Comment: Quit several years ago, never did IVDU only smoked cocaine  . Sexual Activity: Yes     Comment: In same sex relationship for 19 years   Other Topics Concern  . None   Social History Narrative   Review of Systems: Review of Systems  Constitutional: Negative for fever and chills.  HENT: Negative for congestion and sore throat.   Eyes: Negative for blurred vision.  Respiratory: Positive for wheezing. Negative for sputum production.   Cardiovascular: Negative for chest pain and leg swelling.  Gastrointestinal: Negative for nausea and vomiting.  Genitourinary:  Negative for dysuria and frequency.  Musculoskeletal: Negative for back pain and joint pain.  Skin: Negative for rash.  Neurological: Negative for dizziness and loss of consciousness.  Psychiatric/Behavioral: Negative for depression.     Objective:  Physical Exam: Filed Vitals:   02/07/15 0835  BP: 139/74  Pulse: 98  Temp: 98 F (36.7 C)  TempSrc: Oral  Weight: 183 lb 14.4 oz (83.416 kg)  SpO2: 100%   Physical Exam  Constitutional: She is oriented to person, place, and time. She appears well-developed and well-nourished.  HENT:  Head: Normocephalic and atraumatic.  Eyes: Conjunctivae  and EOM are normal.  Neck: Normal range of motion.  Cardiovascular: Normal rate, regular rhythm, normal heart sounds and intact distal pulses.   Pulmonary/Chest: Effort normal and breath sounds normal.  Abdominal: Soft. There is no tenderness.  Musculoskeletal: Normal range of motion.  Neurological: She is alert and oriented to person, place, and time.  Skin: Skin is warm and dry.  Psychiatric: She has a normal mood and affect.    Assessment & Plan:   Please see problem list for assessment and plan.  Case discussed with Dr. Daryll Drown

## 2015-02-07 NOTE — Patient Instructions (Signed)
It was nice meeting you today.  A referral has been sent to Obstetrics and Gynecology to evaluate your uterine fibroids.  They will call you with an appointment.  We are checking some blood work today to screen for Hepatitis C.   It is based on a national recommendation for people in your age range to be screened once for this.  If you do not hear from [place], it means your test results were negative and you have nothing to worry about.  We will call you if your results are abnormal.  Thanks, Dr. Juleen China

## 2015-02-07 NOTE — Assessment & Plan Note (Signed)
Assessment: Patient with history of kidney stones and recent CT renal stone study revealed enlarged fibroid uterus with an increase in calcified portion from previous study.  Patient reports increased bleeding with her periods for approximately 6 months with associated increased vaginal pain during her periods.  States that her menstrual cycles are more infrequent over this time as well and occur anywhere from every 1 to 3 months.  Her last Pap smear was in 2013 and was normal.  Plan: - Referral to Ob/Gyn

## 2015-02-08 LAB — HEPATITIS C ANTIBODY: Hep C Virus Ab: <0.1 s/co ratio (ref 0.0–0.9)

## 2015-02-09 ENCOUNTER — Encounter: Payer: Self-pay | Admitting: Internal Medicine

## 2015-02-14 ENCOUNTER — Emergency Department (HOSPITAL_COMMUNITY)
Admission: EM | Admit: 2015-02-14 | Discharge: 2015-02-14 | Disposition: A | Discharge status: Home / Self Care | Payer: No Typology Code available for payment source | Attending: Emergency Medicine | Admitting: Emergency Medicine

## 2015-02-14 ENCOUNTER — Encounter (HOSPITAL_COMMUNITY): Payer: Self-pay | Admitting: *Deleted

## 2015-02-14 ENCOUNTER — Emergency Department (HOSPITAL_COMMUNITY): Payer: No Typology Code available for payment source

## 2015-02-14 DIAGNOSIS — F17210 Nicotine dependence, cigarettes, uncomplicated: Secondary | ICD-10-CM | POA: Diagnosis not present

## 2015-02-14 DIAGNOSIS — R059 Cough, unspecified: Secondary | ICD-10-CM

## 2015-02-14 DIAGNOSIS — R05 Cough: Secondary | ICD-10-CM | POA: Insufficient documentation

## 2015-02-14 DIAGNOSIS — J3489 Other specified disorders of nose and nasal sinuses: Secondary | ICD-10-CM | POA: Insufficient documentation

## 2015-02-14 DIAGNOSIS — Z87442 Personal history of urinary calculi: Secondary | ICD-10-CM | POA: Diagnosis not present

## 2015-02-14 MED ORDER — AZITHROMYCIN 250 MG PO TABS: 250.0000 mg | ORAL_TABLET | Freq: Every day | ORAL | Status: DC

## 2015-02-14 MED ORDER — HYDROCOD POLST-CPM POLST ER 10-8 MG/5ML PO SUER: 5.0000 mL | Freq: Two times a day (BID) | ORAL | Status: DC | PRN

## 2015-02-14 NOTE — Discharge Instructions (Signed)

## 2015-02-14 NOTE — ED Notes (Signed)
Pt reports cough started 1 week ago . Pt reports she can not sleep due to the cough.

## 2015-02-14 NOTE — ED Notes (Signed)
Declined W/C at D/C and was escorted to lobby by RN. 

## 2015-02-14 NOTE — ED Provider Notes (Signed)
CSN: [id number]     Arrival date & time 02/14/15  0800 History   First MD Initiated Contact with Patient 02/14/15 [phone]     Chief Complaint  Patient presents with  . Cough     (Consider location/radiation/quality/duration/timing/severity/associated sxs/prior Treatment) Patient is a 54 y.o. female presenting with cough. The history is provided by the patient.  Cough Cough characteristics:  Productive Sputum characteristics:  White Severity:  Moderate Onset quality:  Gradual Duration:  1 week Timing:  Constant Progression:  Worsening Chronicity:  New Smoker: yes   Context: weather changes   Context: not sick contacts   Relieved by:  None tried Worsened by:  Nothing tried Ineffective treatments:  None tried Associated symptoms: rhinorrhea   Associated symptoms: no chest pain, no chills, no ear pain, no fever, no headaches, no myalgias, no shortness of breath, no sore throat and no wheezing    She smokes about 0.5-1 ppd. She states she has not been able to sleep because her cough is keeping her up at night.   Past Medical History  Diagnosis Date  . Renal stones    Past Surgical History  Procedure Laterality Date  . Umbilical hernia repair  1990  . Lithotrypsy     Family History  Problem Relation Age of Onset  . Cancer Mother   . Diabetes Father   . Colon cancer Neg Hx    Social History  Substance Use Topics  . Smoking status: Current Every Day Smoker -- 1.00 packs/day    Types: Cigarettes  . Smokeless tobacco: Never Used     Comment: Has set a day.  . 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  HENT: Positive for rhinorrhea. Negative for ear pain and sore throat.   Respiratory: Positive for cough. Negative for shortness of breath and wheezing.   Cardiovascular: Negative for chest pain.  Musculoskeletal:  Negative for myalgias.  Neurological: Negative for headaches.      Allergies  Review of patient's allergies indicates no known allergies.  Home Medications   Prior to Admission medications   Medication Sig Start Date End Date Taking? Authorizing Provider  albuterol (PROVENTIL HFA;VENTOLIN HFA) 108 (90 BASE) MCG/ACT inhaler Inhale 2 puffs into the lungs every 6 (six) hours as needed for wheezing or shortness of breath. Patient not taking: Reported on 01/13/2015 06/28/14   Gregor Hams, MD  azithromycin (ZITHROMAX) 250 MG tablet Take 1 tablet (250 mg total) by mouth daily. Take first 2 tablets together, then 1 every day until finished. 02/14/15   Gloriann Loan, PA-C  chlorpheniramine-HYDROcodone (TUSSIONEX PENNKINETIC ER) 10-8 MG/5ML SUER Take 5 mLs by mouth every 12 (twelve) hours as needed for cough. 02/14/15   Gloriann Loan, PA-C  diclofenac sodium (VOLTAREN) 1 % GEL Apply 2 g topically 4 (four) times daily. Patient not taking: Reported on 01/13/2015 12/21/14   Shela Leff, MD  HYDROcodone-acetaminophen (NORCO) 7.5-325 MG tablet Take 1 tablet by mouth 2 (two) times daily as needed for moderate pain. Patient not taking: Reported on 01/13/2015 12/14/14   Shela Leff, MD  ibuprofen (ADVIL,MOTRIN) 200 MG tablet Take 200 mg by mouth every 6 (six) hours as needed for moderate pain.    Historical Provider, MD  ibuprofen (ADVIL,MOTRIN) 600 MG tablet Take 1 tablet (600 mg total) by mouth every  8 (eight) hours as needed. Patient not taking: Reported on 01/13/2015 12/09/14   Shela Leff, MD  nicotine (NICODERM CQ - DOSED IN MG/24 HOURS) 14 mg/24hr patch Place 1 patch (14 mg total) onto the skin daily. Patient not taking: Reported on 01/13/2015 10/19/14   Shela Leff, MD  nicotine (NICODERM CQ - DOSED IN MG/24 HOURS) 21 mg/24hr patch Place 1 patch (21 mg total) onto the skin daily. Patient not taking: Reported on 01/13/2015 09/07/14   Shela Leff, MD  nicotine (NICODERM CQ - DOSED  IN MG/24 HR) 7 mg/24hr patch Place 1 patch (7 mg total) onto the skin daily. Patient not taking: Reported on 01/13/2015 10/19/14   Shela Leff, MD  oxyCODONE-acetaminophen (PERCOCET/ROXICET) 5-325 MG tablet Take 1-2 tablets by mouth every 4 (four) hours as needed for severe pain. 01/14/15   Vivi Barrack, MD   BP 107/75 mmHg  Pulse 76  Temp(Src) 98.7 F (37.1 C) (Oral)  Resp 16  Ht 5\' 5"  (1.651 m)  Wt 83.008 kg  BMI 30.45 kg/m2  SpO2 99%  LMP 12/15/2014 Physical Exam  Constitutional: She is oriented to person, place, and time. She appears well-developed and well-nourished.  HENT:  Head: Normocephalic and atraumatic.  Mouth/Throat: Oropharynx is clear and moist.  Eyes: Conjunctivae are normal. Pupils are equal, round, and reactive to light.  Neck: Normal range of motion. Neck supple.  Cardiovascular: Normal rate, regular rhythm and normal heart sounds.   No murmur heard. Pulmonary/Chest: Effort normal and breath sounds normal. No accessory muscle usage or stridor. No respiratory distress. She has no wheezes. She has no rhonchi. She has no rales.  Abdominal: Soft. Bowel sounds are normal. She exhibits no distension. There is no tenderness.  Musculoskeletal: Normal range of motion.  Lymphadenopathy:    She has no cervical adenopathy.  Neurological: She is alert and oriented to person, place, and time.  Speech clear without dysarthria.  Skin: Skin is warm and dry.  Psychiatric: She has a normal mood and affect. Her behavior is normal.    ED Course  Procedures (including critical care time) Labs Review Labs Reviewed - No data to display  Imaging Review Dg Chest 2 View  02/14/2015  CLINICAL DATA:  Productive cough, congestion, headache and body aches for approximately 1 week. Initial encounter. EXAM: CHEST  2 VIEW COMPARISON:  PA and lateral chest 03/05/2010. FINDINGS: The lungs are clear. Heart size is normal. There is no pneumothorax or pleural effusion. No focal bony  abnormality. IMPRESSION: Negative chest. Electronically Signed   By: Inge Rise M.D.   On: 02/14/2015 09:22   I have personally reviewed and evaluated these images and lab results as part of my medical decision-making.   EKG Interpretation None      MDM   Final diagnoses:  Cough    Patient presents with 1 week history of gradually worsening, moderate, constant productive cough.  No neck stiffness or fevers.  VSS, NAD.  She smokes 0.5-1 ppd.  On exam, heart RRR, lungs CTAB, abdomen soft and benign.  Airway patent.  Patient speaking in full sentences without difficulty.  Will obtain CXR.  Plain film negative for PNA.  Will d/c home with azithromycin given smoking history and worsening cough. Evaluation does not show pathology requring ongoing emergent intervention or admission. Pt is hemodynamically stable and mentating appropriately. Discussed findings/results and plan with patient/guardian, who agrees with plan. All questions answered. Return precautions discussed and outpatient follow up given.      Gloriann Loan,  PA-C 02/14/15 [id number]  Daleen Bo, MD 02/15/15 1740

## 2015-02-15 NOTE — Progress Notes (Signed)
Internal Medicine Clinic Attending  I saw and evaluated the patient.  I personally confirmed the key portions of the history and exam documented by Dr. Wallace and I reviewed pertinent patient test results.  The assessment, diagnosis, and plan were formulated together and I agree with the documentation in the resident's note. 

## 2015-02-16 NOTE — Addendum Note (Signed)
Addended by: Hulan Fray on: 02/16/2015 07:22 PM   Modules accepted: Orders

## 2015-02-22 ENCOUNTER — Other Ambulatory Visit: Payer: Self-pay | Admitting: Internal Medicine

## 2015-02-22 DIAGNOSIS — D259 Leiomyoma of uterus, unspecified: Secondary | ICD-10-CM

## 2015-03-08 ENCOUNTER — Encounter: Payer: Self-pay | Admitting: Obstetrics & Gynecology

## 2015-03-09 ENCOUNTER — Telehealth: Payer: Self-pay | Admitting: *Deleted

## 2015-03-09 NOTE — Telephone Encounter (Signed)
PATIENT CONTACTED WITH U/S APPOINTMENT FOR 1/11/017. PATIENT AWARE.

## 2015-03-16 ENCOUNTER — Ambulatory Visit (HOSPITAL_COMMUNITY)
Admission: RE | Admit: 2015-03-16 | Discharge: 2015-03-16 | Disposition: A | Discharge status: Home / Self Care | Payer: No Typology Code available for payment source | Source: Ambulatory Visit | Attending: Internal Medicine | Admitting: Internal Medicine

## 2015-03-16 DIAGNOSIS — N858 Other specified noninflammatory disorders of uterus: Secondary | ICD-10-CM | POA: Insufficient documentation

## 2015-03-16 DIAGNOSIS — N852 Hypertrophy of uterus: Secondary | ICD-10-CM | POA: Diagnosis not present

## 2015-03-16 DIAGNOSIS — D259 Leiomyoma of uterus, unspecified: Secondary | ICD-10-CM | POA: Diagnosis not present

## 2015-03-24 ENCOUNTER — Ambulatory Visit (INDEPENDENT_AMBULATORY_CARE_PROVIDER_SITE_OTHER): Payer: No Typology Code available for payment source | Admitting: Obstetrics & Gynecology

## 2015-03-24 ENCOUNTER — Encounter: Payer: Self-pay | Admitting: Obstetrics & Gynecology

## 2015-03-24 ENCOUNTER — Other Ambulatory Visit (HOSPITAL_COMMUNITY)
Admission: RE | Admit: 2015-03-24 | Discharge: 2015-03-24 | Disposition: A | Discharge status: Home / Self Care | Payer: No Typology Code available for payment source | Source: Ambulatory Visit | Attending: Obstetrics & Gynecology | Admitting: Obstetrics & Gynecology

## 2015-03-24 VITALS — BP 128/83 | HR 75 | Temp 97.7°F | Wt 176.1 lb

## 2015-03-24 DIAGNOSIS — D259 Leiomyoma of uterus, unspecified: Secondary | ICD-10-CM | POA: Insufficient documentation

## 2015-03-24 DIAGNOSIS — Z124 Encounter for screening for malignant neoplasm of cervix: Secondary | ICD-10-CM

## 2015-03-24 DIAGNOSIS — N939 Abnormal uterine and vaginal bleeding, unspecified: Secondary | ICD-10-CM

## 2015-03-24 DIAGNOSIS — Z1151 Encounter for screening for human papillomavirus (HPV): Secondary | ICD-10-CM

## 2015-03-24 NOTE — Patient Instructions (Addendum)
Uterine Fibroids Uterine fibroids are tissue masses (tumors) that can develop in the womb (uterus). They are also called leiomyomas. This type of tumor is not cancerous (benign) and does not spread to other parts of the body outside of the pelvic area, which is between the hip bones. Occasionally, fibroids may develop in the fallopian tubes, in the cervix, or on the support structures (ligaments) that surround the uterus. You can have one or many fibroids. Fibroids can vary in size, weight, and where they grow in the uterus. Some can become quite large. Most fibroids do not require medical treatment. CAUSES A fibroid can develop when a single uterine cell keeps growing (replicating). Most cells in the human body have a control mechanism that keeps them from replicating without control. SIGNS AND SYMPTOMS Symptoms may include:   Heavy bleeding during your period.  Bleeding or spotting between periods.  Pelvic pain and pressure.  Bladder problems, such as needing to urinate more often (urinary frequency) or urgently.  Inability to reproduce offspring (infertility).  Miscarriages. DIAGNOSIS Uterine fibroids are diagnosed through a physical exam. Your health care provider may feel the lumpy tumors during a pelvic exam. Ultrasonography and an MRI may be done to determine the size, location, and number of fibroids. TREATMENT Treatment may include:  Watchful waiting. This involves getting the fibroid checked by your health care provider to see if it grows or shrinks. Follow your health care provider's recommendations for how often to have this checked.  Hormone medicines. These can be taken by mouth or given through an intrauterine device (IUD).  Surgery.  Removing the fibroids (myomectomy) or the uterus (hysterectomy).  Removing blood supply to the fibroids (uterine artery embolization). If fibroids interfere with your fertility and you want to become pregnant, your health care provider  may recommend having the fibroids removed.  HOME CARE INSTRUCTIONS  Keep all follow-up visits as directed by your health care provider. This is important.  Take medicines only as directed by your health care provider.  If you were prescribed a hormone treatment, take the hormone medicines exactly as directed.  Do not take aspirin, because it can cause bleeding.  Ask your health care provider about taking iron pills and increasing the amount of dark green, leafy vegetables in your diet. These actions can help to boost your blood iron levels, which may be affected by heavy menstrual bleeding.  Pay close attention to your period and tell your health care provider about any changes, such as:  Increased blood flow that requires you to use more pads or tampons than usual per month.  A change in the number of days that your period lasts per month.  A change in symptoms that are associated with your period, such as abdominal cramping or back pain. SEEK MEDICAL CARE IF:  You have pelvic pain, back pain, or abdominal cramps that cannot be controlled with medicines.  You have an increase in bleeding between and during periods.  You soak tampons or pads in a half hour or less.  You feel lightheaded, extra tired, or weak. SEEK IMMEDIATE MEDICAL CARE IF:  You faint.  You have a sudden increase in pelvic pain.   This information is not intended to replace advice given to you by your health care provider. Make sure you discuss any questions you have with your health care provider.   Document Released: 02/17/2000 Document Revised: 03/12/2014 Document Reviewed: 08/18/2013 Elsevier Interactive Patient Education 2016 [employer]  Endometrial Biopsy, Care After Refer to  this sheet in the next few weeks. These instructions provide you with information on caring for yourself after your procedure. Your health care provider may also give you more specific instructions. Your treatment has been  planned according to current medical practices, but problems sometimes occur. Call your health care provider if you have any problems or questions after your procedure. WHAT TO EXPECT AFTER THE PROCEDURE After your procedure, it is typical to have the following:  You may have mild cramping and a small amount of vaginal bleeding for a few days after the procedure. This is normal. HOME CARE INSTRUCTIONS  Only take over-the-counter or prescription medicine as directed by your health care provider.  Do not douche, use tampons, or have sexual intercourse until your health care provider approves.  Follow your health care provider's instructions regarding any activity restrictions, such as strenuous exercise or heavy lifting. SEEK MEDICAL CARE IF:  You have heavy bleeding or bleeding longer than 2 days after the procedure.  You have bad smelling drainage from your vagina.  You have a fever and chills.  Youhave severe lower stomach (abdominal) pain. SEEK IMMEDIATE MEDICAL CARE IF:  You have severe cramps in your stomach or back.  You pass large blood clots.  Your bleeding increases.  You become weak or lightheaded, or you pass out.   This information is not intended to replace advice given to you by your health care provider. Make sure you discuss any questions you have with your health care provider.   Document Released: 12/10/2012 Document Reviewed: 12/10/2012 Elsevier Interactive Patient Education 2016 [place].  Abdominal Hysterectomy Abdominal hysterectomy is a surgical procedure to remove your womb (uterus). Your uterus is the muscular organ that contains a developing baby. This surgery is done for many reasons. You may need an abdominal hysterectomy if you have cancer, growths (tumors), long-term pain, or bleeding. You may also have this procedure if your uterus has slipped down into your vagina (uterine prolapse). Depending on why you need an abdominal hysterectomy, you  may also have other reproductive organs removed. These could include the part of your vagina that connects with your uterus (cervix), the organs that make eggs (ovaries), and the tubes that connect the ovaries to the uterus (fallopian tubes). LET [facility] CARE PROVIDER KNOW ABOUT:  Any allergies you have. All medicines you are taking, including vitamins, herbs, eye drops, creams, and over-the-counter medicines. Previous problems you or members of your family have had with the use of anesthetics. Any blood disorders you have. Previous surgeries you have had. Medical conditions you have. RISKS AND COMPLICATIONS Generally, this is a safe procedure. However, as with any procedure, problems can occur. Infection is the most common problem after an abdominal hysterectomy. Other possible problems include: Bleeding. Formation of blood clots that may break free and travel to your lungs. Injury to other organs near your uterus. Nerve injury causing nerve pain. Decreased interest in sex or pain during sexual intercourse. BEFORE THE PROCEDURE Abdominal hysterectomy is a major surgical procedure. It can affect the way you feel about yourself. Talk to your health care provider about the physical and emotional changes hysterectomy may cause. You may need to have blood work and X-rays done before surgery. Quit smoking if you smoke. Ask your health care provider for help if you are struggling to quit. Stop taking medicines that thin your blood as directed by your health care provider. You may be instructed to take antibiotic medicines or laxatives before surgery. Do not  eat or drink anything for 6-8 hours before surgery. Take your regular medicines with a small sip of water. Bathe or shower the night or morning before surgery. PROCEDURE Abdominal hysterectomy is done in the operating room at the hospital. In most cases, you will be given a medicine that makes you go to sleep (general anesthetic). The  surgeon will make a cut (incision) through the skin in your lower belly. The incision may be about 5-7 inches long. It may go side-to-side or up-and-down. The surgeon will move aside the body tissue that covers your uterus. The surgeon will then carefully take out your uterus along with any of your other reproductive organs that need to be removed. Bleeding will be controlled with clamps or sutures. The surgeon will close your incision with sutures or metal clips. AFTER THE PROCEDURE You will have some pain immediately after the procedure. You will be given pain medicine in the recovery room. You will be taken to your hospital room when you have recovered from the anesthesia. You may need to stay in the hospital for 2-5 days. You will be given instructions for recovery at home.   This information is not intended to replace advice given to you by your health care provider. Make sure you discuss any questions you have with your health care provider.   Document Released: 02/24/2013 Document Reviewed: 02/24/2013 Elsevier Interactive Patient Education [employer].

## 2015-03-24 NOTE — Progress Notes (Signed)
Patient ID: Christy Burke, female   DOB: Aug 28, 1960, 55 y.o.   MRN: [id number] History:  55 y.o. G1P1001 cycles lasting 5-6 day but heavy.  Cycles every other month for 6 months. Cycles heavy when they come.  Pt reports them as interfering with ADLs.  Pt c/o hot flashes.    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  Pt does not want to be on meds to regulate her cycles.  She c/o pain in her lower abd and back- she thinks the pain has been masked because of kidney stones. Pt reports maternal h/o breast CA in her 46's.  The following portions of the patient's history were reviewed and updated as appropriate: allergies, current medications, past family history, past medical history, past social history, past surgical history and problem list. Pt is sexually active: married/female.  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  Past Medical History  Diagnosis Date  . Renal stones     Past Surgical History  Procedure Laterality Date  . Umbilical hernia repair  1990  . Lithotrypsy    2014/2016  Current Outpatient Prescriptions on File Prior to Visit  Medication Sig Dispense Refill  . albuterol (PROVENTIL HFA;VENTOLIN HFA) 108 (90 BASE) MCG/ACT inhaler Inhale 2 puffs into the lungs every 6 (six) hours as needed for wheezing or shortness of breath. 1 Inhaler 2  . diclofenac sodium (VOLTAREN) 1 % GEL Apply 2 g topically 4 (four) times daily. 100 g 3  . nicotine (NICODERM CQ - DOSED IN MG/24 HOURS) 14 mg/24hr patch Place 1 patch (14 mg total) onto the skin daily. 14 patch 0  . nicotine (NICODERM CQ - DOSED IN MG/24 HOURS) 21 mg/24hr patch Place 1 patch (21 mg total) onto the skin daily. 42 patch 0  . nicotine (NICODERM CQ - DOSED IN MG/24 HR) 7 mg/24hr patch Place 1 patch (7 mg total) onto the skin daily. 14 patch 0  . azithromycin (ZITHROMAX) 250 MG tablet Take 1 tablet (250 mg total) by mouth daily. Take first 2 tablets together, then 1 every day until finished. (Patient not taking: Reported on 03/24/2015) 6 tablet  0  . chlorpheniramine-HYDROcodone (TUSSIONEX PENNKINETIC ER) 10-8 MG/5ML SUER Take 5 mLs by mouth every 12 (twelve) hours as needed for cough. (Patient not taking: Reported on 03/24/2015) 20 mL 0  . HYDROcodone-acetaminophen (NORCO) 7.5-325 MG tablet Take 1 tablet by mouth 2 (two) times daily as needed for moderate pain. (Patient not taking: Reported on 01/13/2015) 30 tablet 0  . ibuprofen (ADVIL,MOTRIN) 200 MG tablet Take 200 mg by mouth every 6 (six) hours as needed for moderate pain. Reported on 03/24/2015    . ibuprofen (ADVIL,MOTRIN) 600 MG tablet Take 1 tablet (600 mg total) by mouth every 8 (eight) hours as needed. (Patient not taking: Reported on 01/13/2015) 20 tablet 0  . oxyCODONE-acetaminophen (PERCOCET/ROXICET) 5-325 MG tablet Take 1-2 tablets by mouth every 4 (four) hours as needed for severe pain. (Patient not taking: Reported on 03/24/2015) 30 tablet 0   No current facility-administered medications on file prior to visit.   Social History   Social History  . Marital Status: Single    Spouse Name: N/A  . Number of Children: N/A  . Years of Education: N/A   Occupational History  . Not on file.   Social History Main Topics  . Smoking status: Current Every Day Smoker -- 1.00 packs/day    Types: Cigarettes  . Smokeless tobacco: Never Used     Comment: Has set a day.  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 Drug Use: No     Comment: Quit several years ago, never did IVDU only smoked cocaine  . Sexual Activity: Yes     Comment: In same sex relationship for 19 years   Other Topics Concern  . Not on file   Social History Narrative  prev h/o crack and ETOH. Last use Mar 2012   Review of Systems:  Last PAP 2013    Objective:  Physical Exam Blood pressure 128/83, pulse 75, temperature 97.7 F (36.5 C), weight 176 lb 1.6 oz (79.878 kg). Gen: NAD Abd: Soft, nontender and nondistended Pelvic: Normal appearing  external genitalia; normal appearing vaginal mucosa and cervix.  Normal discharge.  Uterus 20 weeks. At umbilicus. no other palpable masses, but, difficult to appreciate due to enlarged uterus,  no uterine or adnexal tenderness  The indications for endometrial biopsy were reviewed.   Risks of the biopsy including cramping, bleeding, infection, uterine perforation, inadequate specimen and need for additional procedures  were discussed. The patient states she understands and agrees to undergo procedure today. Consent was signed. Time out was performed. Urine HCG was negative. A sterile speculum was placed in the patient's vagina and the cervix was prepped with Betadine. A single-toothed tenaculum was placed on the anterior lip of the cervix to stabilize it. The 3 mm pipelle was introduced into the endometrial cavity without difficulty to a depth of 9cm, and a moderate amount of tissue was obtained and sent to pathology. The instruments were removed from the patient's vagina. Minimal bleeding from the cervix was noted. The patient tolerated the procedure well. Routine post-procedure instructions were given to the patient. The patient will follow up to review the results and for further management.     CBC    Component Value Date/Time   WBC 7.5 01/13/2015 2204   RBC 4.78 01/13/2015 2204   HGB 12.2 01/13/2015 2204   HCT 37.9 01/13/2015 2204   PLT 222 01/13/2015 2204   MCV 79.3 01/13/2015 2204   MCH 25.5* 01/13/2015 2204   MCHC 32.2 01/13/2015 2204   RDW 14.8 01/13/2015 2204   LYMPHSABS 2.5 03/05/2010 2222   MONOABS 0.8 03/05/2010 2222   EOSABS 0.2 03/05/2010 2222   BASOSABS 0.0 03/05/2010 2222     Labs and Imaging US Pelvis Complete  03/16/2015  CLINICAL DATA:  Fibroids EXAM: TRANSABDOMINAL ULTRASOUND OF PELVIS TECHNIQUE: Transabdominal ultrasound examination of the pelvis was performed including evaluation of the uterus, ovaries, adnexal regions, and pelvic cul-de-sac. COMPARISON:  CT scan  02/13/2013 FINDINGS: Uterus Measurements: There is enlarged uterus with heterogeneous echogenicity. Measures 20.5 x 11.1 x 13.1 cm. Multiple punctate amorphous calcifications are noted probable calcified fibroids. A anterior fundal fibroid measures at least 5.1 x 5.2 x 3 cm. There is a midbody anterior fibroid measures 4.3 x 4 x 4.6 cm Endometrium Thickness: Not identified distorted by fibroids. Right ovary Measurements: Not identified Left ovary Measurements: Not visualized Other findings:  No abnormal free fluid. IMPRESSION: There is heterogeneous enlarged uterus with multiple punctate calcifications probable within fibroids. A anterior fundal fibroid measures at least 5.1 x 5.2 cm. Anterior mid body myometrial fibroid measures 4.6 x 4.3 cm. The endometrial stripe is not identified probable distorted by fibroids. Correlation with GYN exam is recommended. The ovaries are not visualized.  No pelvic free fluid Electronically Signed   By: Lahoma Crocker M.D.   On: 03/16/2015 10:37  Assessment & Plan:  Fibroids- symptomatic >20 weeks sized AUB- thought due to uterine fibroids and perimenopause.  Pt decline any meds to control bleeding or pain.  Wants definitive treatment.    F/u PAP F/u surg path  Patient desires surgical management with total abdominal hyst.  The risks of surgery were discussed in detail with the patient including but not limited to: bleeding which may require transfusion or reoperation; infection which may require prolonged hospitalization or re-hospitalization and antibiotic therapy; injury to bowel, bladder, ureters and major vessels or other surrounding organs; need for additional procedures including laparotomy; thromboembolic phenomenon, incisional problems and other postoperative or anesthesia complications.  Patient was told that the likelihood that her condition and symptoms will be treated effectively with this surgical management was very high; the postoperative expectations were  also discussed in detail. The patient also understands the alternative treatment options which were discussed in full. All questions were answered.  She was told that she will be contacted by our surgical scheduler regarding the time and date of her surgery; routine preoperative instructions of having nothing to eat or drink after midnight on the day prior to surgery and also coming to the hospital 1 1/2 hours prior to her time of surgery were also emphasized.  She was told she may be called for a preoperative appointment about a week prior to surgery and will be given further preoperative instructions at that visit. Printed patient education handouts about the procedure were given to the patient to review at home.  Maxima Skelton L. Harraway-Smith, M.D., [facility]

## 2015-03-28 LAB — CYTOLOGY - PAP

## 2015-03-31 ENCOUNTER — Telehealth: Payer: Self-pay | Admitting: General Practice

## 2015-03-31 ENCOUNTER — Telehealth: Payer: Self-pay

## 2015-03-31 NOTE — Telephone Encounter (Signed)
Patient returned Carrie's call. I went on ahead and gave her the message regarding her results and also her scheduled surgery and letter we sent to her. She understood.

## 2015-03-31 NOTE — Telephone Encounter (Signed)
Per Dr Ihor Dow, patient's endo bx and pap were negative. Patient should be receiving a letter in the mail with her surgery date soon. Called patient, no answer- left message stating we are trying to reach you with non urgent results, please call us back at the clinics

## 2015-04-01 NOTE — Telephone Encounter (Signed)
Called patient, no answer- left message stating we are trying to reach you with non urgent results, please call us back at the clinics 

## 2015-04-04 ENCOUNTER — Encounter (HOSPITAL_COMMUNITY): Payer: Self-pay | Admitting: *Deleted

## 2015-04-04 NOTE — Telephone Encounter (Signed)
LEFT SECOND FOR PATIENT TO CALL us BACK REGARDING RESULTS.

## 2015-04-05 NOTE — Telephone Encounter (Signed)
Sending patient a letter since patient has not made contact for [place]

## 2015-04-20 ENCOUNTER — Encounter (HOSPITAL_COMMUNITY): Payer: Self-pay

## 2015-04-20 ENCOUNTER — Encounter (HOSPITAL_COMMUNITY)
Admission: RE | Admit: 2015-04-20 | Discharge: 2015-04-20 | Disposition: A | Discharge status: Home / Self Care | Payer: No Typology Code available for payment source | Source: Ambulatory Visit | Attending: Obstetrics & Gynecology | Admitting: Obstetrics & Gynecology

## 2015-04-20 DIAGNOSIS — Z01812 Encounter for preprocedural laboratory examination: Secondary | ICD-10-CM | POA: Diagnosis not present

## 2015-04-20 HISTORY — DX: Unspecified asthma, uncomplicated: J45.909

## 2015-04-20 HISTORY — DX: Personal history of other diseases of the digestive system: Z87.19

## 2015-04-20 LAB — CBC
HCT: 41.1 % (ref 36.0–46.0)
HEMOGLOBIN: 13.4 g/dL (ref 12.0–15.0)
MCH: 25.5 pg — ABNORMAL LOW (ref 26.0–34.0)
MCHC: 32.6 g/dL (ref 30.0–36.0)
MCV: 78.3 fL (ref 78.0–100.0)
Platelets: 241 10*3/uL (ref 150–400)
RBC: 5.25 MIL/uL — AB (ref 3.87–5.11)
RDW: 14.8 % (ref 11.5–15.5)
WBC: 6.7 10*3/uL (ref 4.0–10.5)

## 2015-04-20 LAB — TYPE AND SCREEN
ABO/RH(D): A POS
ANTIBODY SCREEN: NEGATIVE

## 2015-04-20 LAB — ABO/RH: ABO/RH(D): A POS

## 2015-04-20 NOTE — Patient Instructions (Addendum)
Your procedure is scheduled on:  Tuesday, Feb. 21, 2017  Enter through the [employer] [facility] at:  7:30 A.M.  Pick up the phone at the desk and dial [phone].  Call this number if you have problems the morning of surgery: [phone].  Remember: Do NOT eat food or drink after:  Midnight Monday  Take these medicines the morning of surgery with a SIP OF WATER: None  *Bring asthma inhaler day of surgery  *Bring copy of healthcare power of attorney day of surgery  Do NOT wear jewelry (body piercing), make-up, or nail polish. Do NOT wear lotions, powders, or perfumes.  You may wear deoderant. Do NOT shave for 48 hours prior to surgery. Do NOT bring valuables to the hospital.  NO SMOKING THE DAY OF SURGERY  Contacts, dentures, or bridgework may not be worn into surgery.  Leave suitcase in car.  After surgery it may be brought to your room.  For patients admitted to the hospital, checkout time is 11:00 AM the day of discharge.

## 2015-04-25 ENCOUNTER — Telehealth: Payer: Self-pay | Admitting: *Deleted

## 2015-04-25 NOTE — Telephone Encounter (Signed)
Received a message from precert requesting precertification for Zeffie's surgery for tomorrow- TAH /Salpingectomy. Called her insurance and verified insurance is active. Called and received prior approval for surgery and 2 days inpt stay [id number].

## 2015-04-26 ENCOUNTER — Encounter (HOSPITAL_COMMUNITY): Payer: Self-pay | Admitting: Anesthesiology

## 2015-04-26 ENCOUNTER — Encounter (HOSPITAL_COMMUNITY)
Admission: RE | Disposition: A | Discharge status: Home / Self Care | Payer: Self-pay | Source: Ambulatory Visit | Attending: Obstetrics & Gynecology

## 2015-04-26 ENCOUNTER — Inpatient Hospital Stay (HOSPITAL_COMMUNITY)
Admission: RE | Admit: 2015-04-26 | Discharge: 2015-04-28 | DRG: 743 | Disposition: A | Discharge status: Home / Self Care | Payer: No Typology Code available for payment source | Source: Ambulatory Visit | Attending: Obstetrics & Gynecology | Admitting: Obstetrics & Gynecology

## 2015-04-26 ENCOUNTER — Inpatient Hospital Stay (HOSPITAL_COMMUNITY): Payer: No Typology Code available for payment source | Admitting: Anesthesiology

## 2015-04-26 DIAGNOSIS — Z87442 Personal history of urinary calculi: Secondary | ICD-10-CM | POA: Diagnosis not present

## 2015-04-26 DIAGNOSIS — N939 Abnormal uterine and vaginal bleeding, unspecified: Secondary | ICD-10-CM

## 2015-04-26 DIAGNOSIS — J45909 Unspecified asthma, uncomplicated: Secondary | ICD-10-CM | POA: Diagnosis present

## 2015-04-26 DIAGNOSIS — N938 Other specified abnormal uterine and vaginal bleeding: Secondary | ICD-10-CM | POA: Diagnosis present

## 2015-04-26 DIAGNOSIS — Z833 Family history of diabetes mellitus: Secondary | ICD-10-CM

## 2015-04-26 DIAGNOSIS — D259 Leiomyoma of uterus, unspecified: Principal | ICD-10-CM

## 2015-04-26 DIAGNOSIS — Z803 Family history of malignant neoplasm of breast: Secondary | ICD-10-CM | POA: Diagnosis not present

## 2015-04-26 DIAGNOSIS — F17210 Nicotine dependence, cigarettes, uncomplicated: Secondary | ICD-10-CM | POA: Diagnosis present

## 2015-04-26 LAB — PREGNANCY, URINE: PREG TEST UR: NEGATIVE

## 2015-04-26 SURGERY — HYSTERECTOMY, TOTAL, ABDOMINAL, WITH SALPINGECTOMY
Anesthesia: General | Site: Abdomen | Laterality: Bilateral

## 2015-04-26 MED ORDER — LACTATED RINGERS IV SOLN
INTRAVENOUS | Status: DC
  Administered 2015-04-26 (×2): via INTRAVENOUS

## 2015-04-26 MED ORDER — OXYCODONE-ACETAMINOPHEN 5-325 MG PO TABS
1.0000 | ORAL_TABLET | ORAL | Status: DC | PRN
  Administered 2015-04-27 – 2015-04-28 (×4): 2 via ORAL
  Filled 2015-04-26 (×4): qty 2

## 2015-04-26 MED ORDER — DEXAMETHASONE SODIUM PHOSPHATE 10 MG/ML IJ SOLN
INTRAMUSCULAR | Status: DC | PRN
  Administered 2015-04-26: 8 mg via INTRAVENOUS

## 2015-04-26 MED ORDER — KETOROLAC TROMETHAMINE 30 MG/ML IJ SOLN
30.0000 mg | Freq: Four times a day (QID) | INTRAMUSCULAR | Status: AC
  Administered 2015-04-26 – 2015-04-27 (×3): 30 mg via INTRAVENOUS
  Filled 2015-04-26 (×3): qty 1

## 2015-04-26 MED ORDER — MORPHINE SULFATE 2 MG/ML IV SOLN
INTRAVENOUS | Status: DC
  Filled 2015-04-26: qty 25

## 2015-04-26 MED ORDER — PROMETHAZINE HCL 25 MG/ML IJ SOLN: 6.2500 mg | INTRAMUSCULAR | Status: DC | PRN

## 2015-04-26 MED ORDER — CEFAZOLIN SODIUM-DEXTROSE 2-3 GM-% IV SOLR
INTRAVENOUS | Status: AC
Start: 2015-04-26 — End: 2015-04-26
  Administered 2015-04-26: 2 g via INTRAVENOUS
  Filled 2015-04-26: qty 50

## 2015-04-26 MED ORDER — FENTANYL CITRATE (PF) 250 MCG/5ML IJ SOLN
INTRAMUSCULAR | Status: AC
Start: 2015-04-26 — End: 2015-04-26
  Filled 2015-04-26: qty 5

## 2015-04-26 MED ORDER — NALOXONE HCL 0.4 MG/ML IJ SOLN: 0.4000 mg | INTRAMUSCULAR | Status: DC | PRN

## 2015-04-26 MED ORDER — MIDAZOLAM HCL 2 MG/2ML IJ SOLN
INTRAMUSCULAR | Status: DC | PRN
  Administered 2015-04-26: 2 mg via INTRAVENOUS

## 2015-04-26 MED ORDER — SODIUM CHLORIDE 0.9% FLUSH: 9.0000 mL | INTRAVENOUS | Status: DC | PRN

## 2015-04-26 MED ORDER — LIDOCAINE HCL (CARDIAC) 20 MG/ML IV SOLN
INTRAVENOUS | Status: DC | PRN
  Administered 2015-04-26: 50 mg via INTRAVENOUS

## 2015-04-26 MED ORDER — PROPOFOL 10 MG/ML IV BOLUS
INTRAVENOUS | Status: AC
  Filled 2015-04-26: qty 20

## 2015-04-26 MED ORDER — SCOPOLAMINE 1 MG/3DAYS TD PT72
1.0000 | MEDICATED_PATCH | Freq: Once | TRANSDERMAL | Status: DC
  Administered 2015-04-26: 1.5 mg via TRANSDERMAL

## 2015-04-26 MED ORDER — IBUPROFEN 600 MG PO TABS
600.0000 mg | ORAL_TABLET | Freq: Four times a day (QID) | ORAL | Status: DC | PRN
  Administered 2015-04-27 – 2015-04-28 (×3): 600 mg via ORAL
  Filled 2015-04-26 (×3): qty 1

## 2015-04-26 MED ORDER — KETOROLAC TROMETHAMINE 30 MG/ML IJ SOLN: 30.0000 mg | Freq: Four times a day (QID) | INTRAMUSCULAR | Status: AC

## 2015-04-26 MED ORDER — CEFAZOLIN SODIUM-DEXTROSE 2-3 GM-% IV SOLR: 2.0000 g | INTRAVENOUS | Status: DC

## 2015-04-26 MED ORDER — MORPHINE SULFATE 2 MG/ML IV SOLN
INTRAVENOUS | Status: DC
  Administered 2015-04-26: 14 mg via INTRAVENOUS
  Administered 2015-04-26: 9 mg via INTRAVENOUS
  Administered 2015-04-26: 13:00:00 via INTRAVENOUS
  Administered 2015-04-27: 13 mg via INTRAVENOUS
  Administered 2015-04-27: 06:00:00 via INTRAVENOUS
  Administered 2015-04-27: 8 mg via INTRAVENOUS
  Filled 2015-04-26: qty 25

## 2015-04-26 MED ORDER — DOCUSATE SODIUM 100 MG PO CAPS
100.0000 mg | ORAL_CAPSULE | Freq: Two times a day (BID) | ORAL | Status: DC
  Administered 2015-04-26 – 2015-04-27 (×3): 100 mg via ORAL
  Filled 2015-04-26 (×3): qty 1

## 2015-04-26 MED ORDER — ONDANSETRON HCL 4 MG/2ML IJ SOLN: 4.0000 mg | Freq: Four times a day (QID) | INTRAMUSCULAR | Status: DC | PRN

## 2015-04-26 MED ORDER — HYDROMORPHONE HCL 1 MG/ML IJ SOLN
INTRAMUSCULAR | Status: AC
  Administered 2015-04-26: 0.25 mg via INTRAVENOUS
  Filled 2015-04-26: qty 1

## 2015-04-26 MED ORDER — ONDANSETRON HCL 4 MG PO TABS: 4.0000 mg | ORAL_TABLET | Freq: Four times a day (QID) | ORAL | Status: DC | PRN

## 2015-04-26 MED ORDER — DIPHENHYDRAMINE HCL 12.5 MG/5ML PO ELIX
12.5000 mg | ORAL_SOLUTION | Freq: Four times a day (QID) | ORAL | Status: DC | PRN
  Administered 2015-04-28: 12.5 mg via ORAL
  Filled 2015-04-26: qty 5

## 2015-04-26 MED ORDER — ZOLPIDEM TARTRATE 5 MG PO TABS
5.0000 mg | ORAL_TABLET | Freq: Every evening | ORAL | Status: DC | PRN
  Administered 2015-04-27: 5 mg via ORAL
  Filled 2015-04-26: qty 1

## 2015-04-26 MED ORDER — LIDOCAINE HCL (CARDIAC) 20 MG/ML IV SOLN
INTRAVENOUS | Status: AC
  Filled 2015-04-26: qty 5

## 2015-04-26 MED ORDER — DIPHENHYDRAMINE HCL 12.5 MG/5ML PO ELIX: 12.5000 mg | ORAL_SOLUTION | Freq: Four times a day (QID) | ORAL | Status: DC | PRN

## 2015-04-26 MED ORDER — KETOROLAC TROMETHAMINE 30 MG/ML IJ SOLN
30.0000 mg | Freq: Once | INTRAMUSCULAR | Status: AC
  Administered 2015-04-26: 30 mg via INTRAVENOUS

## 2015-04-26 MED ORDER — NEOSTIGMINE METHYLSULFATE 10 MG/10ML IV SOLN
INTRAVENOUS | Status: DC | PRN
  Administered 2015-04-26: 4 mg via INTRAVENOUS

## 2015-04-26 MED ORDER — PANTOPRAZOLE SODIUM 40 MG PO TBEC
40.0000 mg | DELAYED_RELEASE_TABLET | Freq: Every day | ORAL | Status: DC
  Administered 2015-04-27: 40 mg via ORAL
  Filled 2015-04-26: qty 1

## 2015-04-26 MED ORDER — OXYCODONE HCL 5 MG PO TABS: 5.0000 mg | ORAL_TABLET | Freq: Once | ORAL | Status: DC | PRN

## 2015-04-26 MED ORDER — BUPIVACAINE HCL (PF) 0.5 % IJ SOLN
INTRAMUSCULAR | Status: DC | PRN
Start: 2015-04-26 — End: 2015-04-26
  Administered 2015-04-26: 30 mL

## 2015-04-26 MED ORDER — BUPIVACAINE HCL (PF) 0.5 % IJ SOLN
INTRAMUSCULAR | Status: AC
  Filled 2015-04-26: qty 30

## 2015-04-26 MED ORDER — HYDROMORPHONE HCL 1 MG/ML IJ SOLN
0.2500 mg | INTRAMUSCULAR | Status: DC | PRN
  Administered 2015-04-26: 0.25 mg via INTRAVENOUS
  Administered 2015-04-26: 0.5 mg via INTRAVENOUS
  Administered 2015-04-26: 0.25 mg via INTRAVENOUS

## 2015-04-26 MED ORDER — DIPHENHYDRAMINE HCL 50 MG/ML IJ SOLN: 12.5000 mg | Freq: Four times a day (QID) | INTRAMUSCULAR | Status: DC | PRN

## 2015-04-26 MED ORDER — DEXAMETHASONE SODIUM PHOSPHATE 4 MG/ML IJ SOLN
INTRAMUSCULAR | Status: AC
  Filled 2015-04-26: qty 1

## 2015-04-26 MED ORDER — ROCURONIUM BROMIDE 100 MG/10ML IV SOLN
INTRAVENOUS | Status: DC | PRN
  Administered 2015-04-26: 50 mg via INTRAVENOUS

## 2015-04-26 MED ORDER — GLYCOPYRROLATE 0.2 MG/ML IJ SOLN
INTRAMUSCULAR | Status: AC
  Filled 2015-04-26: qty 5

## 2015-04-26 MED ORDER — NEOSTIGMINE METHYLSULFATE 10 MG/10ML IV SOLN
INTRAVENOUS | Status: AC
  Filled 2015-04-26: qty 1

## 2015-04-26 MED ORDER — DEXTROSE-NACL 5-0.45 % IV SOLN
INTRAVENOUS | Status: DC
  Administered 2015-04-26 – 2015-04-27 (×2): via INTRAVENOUS

## 2015-04-26 MED ORDER — GLYCOPYRROLATE 0.2 MG/ML IJ SOLN
INTRAMUSCULAR | Status: DC | PRN
  Administered 2015-04-26: .8 mg via INTRAVENOUS

## 2015-04-26 MED ORDER — MENTHOL 3 MG MT LOZG: 1.0000 | LOZENGE | OROMUCOSAL | Status: DC | PRN

## 2015-04-26 MED ORDER — ONDANSETRON HCL 4 MG/2ML IJ SOLN
INTRAMUSCULAR | Status: AC
  Filled 2015-04-26: qty 2

## 2015-04-26 MED ORDER — ALBUTEROL SULFATE (2.5 MG/3ML) 0.083% IN NEBU: 3.0000 mL | INHALATION_SOLUTION | Freq: Four times a day (QID) | RESPIRATORY_TRACT | Status: DC | PRN

## 2015-04-26 MED ORDER — ROCURONIUM BROMIDE 100 MG/10ML IV SOLN
INTRAVENOUS | Status: AC
Start: 2015-04-26 — End: 2015-04-26
  Filled 2015-04-26: qty 1

## 2015-04-26 MED ORDER — ONDANSETRON HCL 4 MG/2ML IJ SOLN
INTRAMUSCULAR | Status: DC | PRN
  Administered 2015-04-26: 4 mg via INTRAVENOUS

## 2015-04-26 MED ORDER — SIMETHICONE 80 MG PO CHEW: 80.0000 mg | CHEWABLE_TABLET | Freq: Four times a day (QID) | ORAL | Status: DC | PRN

## 2015-04-26 MED ORDER — FENTANYL CITRATE (PF) 100 MCG/2ML IJ SOLN
INTRAMUSCULAR | Status: AC
  Filled 2015-04-26: qty 2

## 2015-04-26 MED ORDER — VASOPRESSIN 20 UNIT/ML IV SOLN
INTRAVENOUS | Status: AC
  Filled 2015-04-26: qty 3

## 2015-04-26 MED ORDER — OXYCODONE HCL 5 MG/5ML PO SOLN: 5.0000 mg | Freq: Once | ORAL | Status: DC | PRN

## 2015-04-26 MED ORDER — POLYETHYLENE GLYCOL 3350 17 G PO PACK: 17.0000 g | PACK | Freq: Every day | ORAL | Status: DC | PRN

## 2015-04-26 MED ORDER — KETOROLAC TROMETHAMINE 30 MG/ML IJ SOLN
INTRAMUSCULAR | Status: AC
  Filled 2015-04-26: qty 1

## 2015-04-26 MED ORDER — MIDAZOLAM HCL 2 MG/2ML IJ SOLN
INTRAMUSCULAR | Status: AC
  Filled 2015-04-26: qty 2

## 2015-04-26 MED ORDER — PROPOFOL 10 MG/ML IV BOLUS
INTRAVENOUS | Status: DC | PRN
  Administered 2015-04-26: 200 mg via INTRAVENOUS

## 2015-04-26 MED ORDER — BISACODYL 10 MG RE SUPP: 10.0000 mg | Freq: Every day | RECTAL | Status: DC | PRN

## 2015-04-26 MED ORDER — FENTANYL CITRATE (PF) 100 MCG/2ML IJ SOLN
INTRAMUSCULAR | Status: DC | PRN
  Administered 2015-04-26: 100 ug via INTRAVENOUS
  Administered 2015-04-26: 150 ug via INTRAVENOUS
  Administered 2015-04-26 (×2): 100 ug via INTRAVENOUS
  Administered 2015-04-26: 50 ug via INTRAVENOUS

## 2015-04-26 MED ORDER — FENTANYL CITRATE (PF) 250 MCG/5ML IJ SOLN
INTRAMUSCULAR | Status: AC
  Filled 2015-04-26: qty 5

## 2015-04-26 MED ORDER — SODIUM CHLORIDE 0.9 % IJ SOLN
INTRAMUSCULAR | Status: AC
  Filled 2015-04-26: qty 100

## 2015-04-26 MED ORDER — SCOPOLAMINE 1 MG/3DAYS TD PT72
MEDICATED_PATCH | TRANSDERMAL | Status: AC
  Administered 2015-04-26: 1.5 mg via TRANSDERMAL
  Filled 2015-04-26: qty 1

## 2015-04-26 MED ORDER — LACTATED RINGERS IV SOLN
INTRAVENOUS | Status: DC
  Administered 2015-04-26: 08:00:00 via INTRAVENOUS

## 2015-04-26 SURGICAL SUPPLY — 49 items
APL SKNCLS STERI-STRIP NONHPOA (GAUZE/BANDAGES/DRESSINGS) ×1
BARRIER ADHS 3X4 INTERCEED (GAUZE/BANDAGES/DRESSINGS) IMPLANT
BENZOIN TINCTURE PRP APPL 2/3 (GAUZE/BANDAGES/DRESSINGS) ×1 IMPLANT
BINDER ABD UNIV 12 45-62 (WOUND CARE) IMPLANT
BINDER ABDOMINAL 46IN 62IN (WOUND CARE)
BRR ADH 4X3 ABS CNTRL BYND (GAUZE/BANDAGES/DRESSINGS)
CANISTER SUCT 3000ML (MISCELLANEOUS) ×2 IMPLANT
CELLS DAT CNTRL 66122 CELL SVR (MISCELLANEOUS) IMPLANT
CLOTH BEACON ORANGE TIMEOUT ST (SAFETY) ×2 IMPLANT
CONT PATH 16OZ SNAP LID 3702 (MISCELLANEOUS) ×2 IMPLANT
DECANTER SPIKE VIAL GLASS SM (MISCELLANEOUS) IMPLANT
DRAPE WARM FLUID 44X44 (DRAPE) ×1 IMPLANT
DRSG OPSITE POSTOP 4X10 (GAUZE/BANDAGES/DRESSINGS) ×2 IMPLANT
DURAPREP 26ML APPLICATOR (WOUND CARE) ×2 IMPLANT
GAUZE SPONGE 4X4 12PLY STRL (GAUZE/BANDAGES/DRESSINGS) ×1 IMPLANT
GAUZE SPONGE 4X4 16PLY XRAY LF (GAUZE/BANDAGES/DRESSINGS) IMPLANT
GLOVE BIO SURGEON STRL SZ7 (GLOVE) ×2 IMPLANT
GLOVE BIOGEL PI IND STRL 7.0 (GLOVE) ×4 IMPLANT
GLOVE BIOGEL PI INDICATOR 7.0 (GLOVE) ×4
GOWN STRL REUS W/TWL LRG LVL3 (GOWN DISPOSABLE) ×4 IMPLANT
GOWN STRL REUS W/TWL XL LVL3 (GOWN DISPOSABLE) ×3 IMPLANT
NEEDLE HYPO 22GX1.5 SAFETY (NEEDLE) ×2 IMPLANT
NS IRRIG 1000ML POUR BTL (IV SOLUTION) ×3 IMPLANT
PACK ABDOMINAL GYN (CUSTOM PROCEDURE TRAY) ×2 IMPLANT
PAD ABD 7.5X8 STRL (GAUZE/BANDAGES/DRESSINGS) ×4 IMPLANT
PAD OB MATERNITY 4.3X12.25 (PERSONAL CARE ITEMS) ×2 IMPLANT
PENCIL SMOKE EVAC W/HOLSTER (ELECTROSURGICAL) ×2 IMPLANT
RETRACTOR WND ALEXIS 18 MED (MISCELLANEOUS) IMPLANT
RETRACTOR WND ALEXIS 25 LRG (MISCELLANEOUS) IMPLANT
RTRCTR WOUND ALEXIS 18CM MED (MISCELLANEOUS)
RTRCTR WOUND ALEXIS 25CM LRG (MISCELLANEOUS)
SPONGE LAP 18X18 X RAY DECT (DISPOSABLE) ×3 IMPLANT
STAPLER VISISTAT 35W (STAPLE) ×1 IMPLANT
STRIP CLOSURE SKIN 1/2X4 (GAUZE/BANDAGES/DRESSINGS) ×1 IMPLANT
SUT VIC AB 0 CT1 18XCR BRD8 (SUTURE) ×3 IMPLANT
SUT VIC AB 0 CT1 27 (SUTURE) ×4
SUT VIC AB 0 CT1 27XBRD ANBCTR (SUTURE) ×2 IMPLANT
SUT VIC AB 0 CT1 36 (SUTURE) ×2 IMPLANT
SUT VIC AB 0 CT1 8-18 (SUTURE) ×6
SUT VIC AB 3-0 CT1 27 (SUTURE) ×2
SUT VIC AB 3-0 CT1 TAPERPNT 27 (SUTURE) ×1 IMPLANT
SUT VIC AB 3-0 SH 27 (SUTURE)
SUT VIC AB 3-0 SH 27X BRD (SUTURE) IMPLANT
SUT VIC AB 4-0 KS 27 (SUTURE) ×1 IMPLANT
SUT VICRYL 0 TIES 12 18 (SUTURE) ×2 IMPLANT
SYR CONTROL 10ML LL (SYRINGE) ×2 IMPLANT
TOWEL OR 17X24 6PK STRL BLUE (TOWEL DISPOSABLE) ×4 IMPLANT
TRAY FOLEY CATH SILVER 14FR (SET/KITS/TRAYS/PACK) ×2 IMPLANT
WATER STERILE IRR 1000ML POUR (IV SOLUTION) ×1 IMPLANT

## 2015-04-26 NOTE — Addendum Note (Signed)
Addendum  created 04/26/15 1930 by Jonna Munro, CRNA   Modules edited: Clinical Notes   Clinical Notes:  File: [id number]

## 2015-04-26 NOTE — Anesthesia Postprocedure Evaluation (Signed)
Anesthesia Post Note  Patient: Christy Burke  Procedure(s) Performed: Procedure(s) (LRB): HYSTERECTOMY ABDOMINAL WITH SALPINGECTOMY (Bilateral)  Patient location during evaluation: PACU Anesthesia Type: General Level of consciousness: sedated Pain management: pain level controlled Vital Signs Assessment: post-procedure vital signs reviewed and stable Respiratory status: spontaneous breathing and respiratory function stable Cardiovascular status: stable Anesthetic complications: no    Last Vitals:  Filed Vitals:   04/26/15 1233 04/26/15 1301  BP: 143/85   Pulse: 81   Temp: 36.9 C   Resp: 20 12    Last Pain:  Filed Vitals:   04/26/15 1302  PainSc: 8                  Farah Benish DANIEL

## 2015-04-26 NOTE — Anesthesia Procedure Notes (Signed)
Procedure Name: Intubation Date/Time: 04/26/2015 9:12 AM Performed by: Jonna Munro Pre-anesthesia Checklist: Patient identified, Emergency Drugs available, Suction available and Timeout performed Patient Re-evaluated:Patient Re-evaluated prior to inductionOxygen Delivery Method: Circle system utilized Preoxygenation: Pre-oxygenation with 100% oxygen Intubation Type: IV induction Ventilation: Mask ventilation without difficulty Laryngoscope Size: Miller and 2 Grade View: Grade I Tube type: Oral Number of attempts: 1 Airway Equipment and Method: Stylet Secured at: 20 cm Tube secured with: Tape Dental Injury: Teeth and Oropharynx as per pre-operative assessment

## 2015-04-26 NOTE — Anesthesia Preprocedure Evaluation (Addendum)
Anesthesia Evaluation  Patient identified by MRN, date of birth, ID band Patient awake    Reviewed: Allergy & Precautions, NPO status , Patient's Chart, lab work & pertinent test results  Airway Mallampati: I  TM Distance: >3 FB Neck ROM: Full    Dental  (+) Dental Advisory Given   Pulmonary asthma , Current Smoker,    breath sounds clear to auscultation       Cardiovascular negative cardio ROS   Rhythm:Regular Rate:Normal     Neuro/Psych negative neurological ROS     GI/Hepatic negative GI ROS, Neg liver ROS,   Endo/Other  negative endocrine ROS  Renal/GU negative Renal ROS     Musculoskeletal negative musculoskeletal ROS (+)   Abdominal   Peds  Hematology negative hematology ROS (+)   Anesthesia Other Findings   Reproductive/Obstetrics                            Lab Results  Component Value Date   WBC 6.7 04/20/2015   HGB 13.4 04/20/2015   HCT 41.1 04/20/2015   MCV 78.3 04/20/2015   PLT 241 04/20/2015   Lab Results  Component Value Date   CREATININE 0.65 01/13/2015   BUN 7 01/13/2015   NA 137 01/13/2015   K 3.9 01/13/2015   CL 104 01/13/2015   CO2 27 01/13/2015    Anesthesia Physical Anesthesia Plan  ASA: II  Anesthesia Plan: General   Post-op Pain Management:    Induction: Intravenous  Airway Management Planned: Oral ETT  Additional Equipment:   Intra-op Plan:   Post-operative Plan: Extubation in OR  Informed Consent: I have reviewed the patients History and Physical, chart, labs and discussed the procedure including the risks, benefits and alternatives for the proposed anesthesia with the patient or authorized representative who has indicated his/her understanding and acceptance.   Dental advisory given  Plan Discussed with: CRNA  Anesthesia Plan Comments:         Anesthesia Quick Evaluation

## 2015-04-26 NOTE — Op Note (Signed)
04/26/2015  11:27 AM  PATIENT:  Christy Burke  55 y.o. female  PRE-OPERATIVE DIAGNOSIS:   Symptomatic uterine fibroids and abnormal uterine bleeding  POST-OPERATIVE DIAGNOSIS:   Symptomatic uterine fibroids and abnormal uterine bleeding  PROCEDURE:  Procedure(s): HYSTERECTOMY ABDOMINAL WITH SALPINGECTOMY (Bilateral)  SURGEON:  Surgeon(s) and Role:    * Lavonia Drafts, MD - Primary    * Osborne Oman, MD - Assisting  ANESTHESIA:   general  EBL:  Total I/O In: 1800 [I.V.:1800] Out: 800 [Urine:150; Blood:650]  BLOOD ADMINISTERED:none  DRAINS: none   LOCAL MEDICATIONS USED:  MARCAINE     SPECIMEN:  Source of Specimen:  uterus and cervix with bilateral fallopian tubes and ovaries    DISPOSITION OF SPECIMEN:  PATHOLOGY  COUNTS:  YES  TOURNIQUET:  * No tourniquets in log *  DICTATION: .Note written in EPIC  PLAN OF CARE: Admit to inpatient   PATIENT DISPOSITION:  PACU - hemodynamically stable.   Delay start of Pharmacological VTE agent (>24hrs) due to surgical blood loss or risk of bleeding: yes  Complications: none immediate  INDICATIONS: The patient is a 55 y.o. No obstetric history on file. with the aforementioned diagnoses who desires definitive surgical management. On the preoperative visit, the risks, benefits, indications, and alternatives of the procedure were reviewed with the patient.  On the day of surgery, the risks of surgery were again discussed with the patient including but not limited to: bleeding which may require transfusion or reoperation; infection which may require antibiotics; injury to bowel, bladder, ureters or other surrounding organs; need for additional procedures; thromboembolic phenomenon, incisional problems and other postoperative/anesthesia complications. Written informed consent was obtained.    OPERATIVE FINDINGS: A 18 week sized multi fibroid uterus with normal tubes and ovaries bilaterally.    DESCRIPTION OF PROCEDURE:  The  patient received intravenous antibiotics and had sequential compression devices applied to her lower extremities while in the preoperative area.   She was taken to the operating room and placed under general anesthesia without difficulty.The abdomen, vagina and perineum were prepped and draped in a sterile manner, and she was placed in a dorsal supine position.  A Foley catheter was inserted into the bladder and attached to constant drainage. After an adequate timeout was performed, a Pfannensteil skin incision was made. This incision was taken down to the fascia using electrocautery with care given to maintain good hemostasis. The fascia was incised in the midline and the fascial incision was then extended bilaterally using electrocautery without difficulty. The fascia was then dissected off the underlying rectus muscles using blunt and sharp dissection. The rectus muscles were split bluntly in the midline and the peritoneum entered sharply without complication. This peritoneal incision was then extended superiorly and inferiorly with care given to prevent bowel or bladder injury. Attention was then turned to the pelvis. The uterus was covered in peritoneum and adhesions from the pelvic sidewall.  This was removed using sharp dissection. The bowel was packed away with moist laparotomy sponges. The uterus at this point was noted to be mobilized and was delivered up out of the abdomen.  The round ligaments on each side were clamped, suture ligated with 0 Vicryl, and transected with electrocautery allowing entry into the broad ligament. Of note, all sutures used in this procedure are 0 Vicryl unless otherwise noted. The anterior and posterior leaves of the broad ligament were separated, and the ureters were inspected to be safely away from the area of dissection bilaterally.  Adnexae were clamped  on the patient's right side, cut, and doubly suture ligated. This procedure was repeated in an identical fashion on the  left site allowing for both adnexa to remain in place. A hole was created in the clear portion of the posterior broad ligament and the infundibulopelvic ligament clamped on the patient's right side. This pedicle was then clamped, cut, and doubly suture ligated with good hemostasis.  This procedure was repeated in an identical fashion on the opposite side, incorporating both ovaries and tubes in the pathology specimen, and allowing salpingoophorectomy.  A bladder flap was then created.  The bladder was then bluntly dissected off the lower uterine segment and cervix with good hemostasis noted. The uterine arteries were then skeletonized bilaterally and then clamped, cut, and doubly suture ligated with care given to prevent ureteral injury.  The uterosacral ligaments were then clamped, cut, and ligated bilaterally.  Finally, the cardinal ligaments were clamped, cut, and ligated bilaterally.  Acutely curved clamps were placed across the vagina just under the cervix, and the specimen was amputated and sent to pathology. The vaginal cuff angles were closed with Heaney stiches with care given to incorporate the uterosacral-cardinal ligament pedicles on both sides. The middle of the vaginal cuff was closed with a series of interrupted figure-of-eight sutures with care given to incorporate the anterior pubocervical fascia and the posterior rectovaginal fascia.   The pelvis was irrigated and hemostasis was reconfirmed at all pedicles and along the pelvic sidewall.  All laparotomy sponges and instruments were removed from the abdomen. The peritoneum was closed with an interrupted suture and the fascia was also closed in a running fashion.  The skin was closed with a 4-0 Vicryl subcuticular stitch. 30cc of 0.5% Marcaine was injected into the incision. Benzoin and steri strips were applied. Sponge, lap, needle, and instrument counts were correct times two. The patient was taken to the recovery area awake, extubated and in stable  condition.  Kyrsten Deleeuw L. Ihor Dow, M.D., [facility] 04/26/2015 11:29 AM

## 2015-04-26 NOTE — Brief Op Note (Signed)
04/26/2015  11:27 AM  PATIENT:  Christy Burke  55 y.o. female  PRE-OPERATIVE DIAGNOSIS:   Symptomatic uterine fibroids and abnormal uterine bleeding  POST-OPERATIVE DIAGNOSIS:   Symptomatic uterine fibroids and abnormal uterine bleeding  PROCEDURE:  Procedure(s): HYSTERECTOMY ABDOMINAL WITH SALPINGECTOMY (Bilateral)  SURGEON:  Surgeon(s) and Role:    * Lavonia Drafts, MD - Primary    * Osborne Oman, MD - Assisting  ANESTHESIA:   general  EBL:  Total I/O In: 1800 [I.V.:1800] Out: 800 [Urine:150; Blood:650]  BLOOD ADMINISTERED:none  DRAINS: none   LOCAL MEDICATIONS USED:  MARCAINE     SPECIMEN:  Source of Specimen:  uterus and cervix with bilateral fallopian tubes and ovaries    DISPOSITION OF SPECIMEN:  PATHOLOGY  COUNTS:  YES  TOURNIQUET:  * No tourniquets in log *  DICTATION: .Note written in EPIC  PLAN OF CARE: Admit to inpatient   PATIENT DISPOSITION:  PACU - hemodynamically stable.   Delay start of Pharmacological VTE agent (>24hrs) due to surgical blood loss or risk of bleeding: yes  Complications: none immediate

## 2015-04-26 NOTE — Anesthesia Postprocedure Evaluation (Signed)
Anesthesia Post Note  Patient: Christy Burke  Procedure(s) Performed: Procedure(s) (LRB): HYSTERECTOMY ABDOMINAL WITH SALPINGECTOMY (Bilateral)  Patient location during evaluation: [facility] Anesthesia Type: General Level of consciousness: awake and alert and oriented Pain management: satisfactory to patient Vital Signs Assessment: post-procedure vital signs reviewed and stable Respiratory status: spontaneous breathing, nonlabored ventilation and respiratory function stable Cardiovascular status: stable Postop Assessment: no signs of nausea or vomiting and adequate PO intake Anesthetic complications: no    Last Vitals:  Filed Vitals:   04/26/15 1713 04/26/15 1745  BP: 151/83   Pulse: 96   Temp: 36.8 C   Resp: 20 15    Last Pain:  Filed Vitals:   04/26/15 1745  PainSc: 5                  Davi Rotan

## 2015-04-26 NOTE — H&P (Signed)
Preoperative History and Physical  Christy Burke is a 55 y.o. No obstetric history on file. here for surgical management of AUB with uterine fibroids.   Proposed surgery:  total abdominal hysterectomy with bilateral salpingo-oophorectomy  Past Medical History  Diagnosis Date  . Renal stones   . Asthma   . History of umbilical hernia    Past Surgical History  Procedure Laterality Date  . Umbilical hernia repair  1990  . Lithotrypsy     OB History    No data available     Patient denies any cervical dysplasia or STIs. Prescriptions prior to admission  Medication Sig Dispense Refill Last Dose  . Ascorbic Acid (VITAMIN C PO) Take 1 tablet by mouth daily.   Past Week at Unknown time  . albuterol (PROVENTIL HFA;VENTOLIN HFA) 108 (90 BASE) MCG/ACT inhaler Inhale 2 puffs into the lungs every 6 (six) hours as needed for wheezing or shortness of breath. 1 Inhaler 2 rescue  . ibuprofen (ADVIL,MOTRIN) 200 MG tablet Take 200 mg by mouth every 6 (six) hours as needed for moderate pain. Reported on 03/24/2015   prn  . oxyCODONE-acetaminophen (PERCOCET/ROXICET) 5-325 MG tablet Take 1-2 tablets by mouth every 4 (four) hours as needed for severe pain. (Patient not taking: Reported on 03/24/2015) 30 tablet 0 Not Taking at Unknown time    No Known Allergies Social History:   reports that she has been smoking Cigarettes.  She has a 39 pack-year smoking history. She has never used smokeless tobacco. She reports that she does not drink alcohol or use illicit drugs.+ pt with h/o substance abuse history- resolved  Family History  Problem Relation Age of Onset  . Cancer Mother   . Diabetes Father   . Colon cancer Neg Hx   Mother with breast cancer in her 94's  Review of Systems: Noncontributory  PHYSICAL EXAM: Blood pressure 115/95, pulse 83, temperature 97.8 F (36.6 C), temperature source Oral, resp. rate 20, SpO2 99 %. General appearance - alert, well appearing, and in no distress Chest - clear to  auscultation, no wheezes, rales or rhonchi, symmetric air entry Heart - normal rate and regular rhythm Abdomen - soft, nontender, nondistended, no masses or organomegaly. Palpable mass to umbilicus Pelvic - examination not indicated Extremities - peripheral pulses normal, no pedal edema, no clubbing or cyanosis  Labs: Results for orders placed or performed during the hospital encounter of 04/26/15 (from the past 336 hour(s))  Pregnancy, urine   Collection Time: 04/26/15  7:30 AM  Result Value Ref Range   Preg Test, Ur NEGATIVE NEGATIVE  Results for orders placed or performed during the hospital encounter of 04/20/15 (from the past 336 hour(s))  CBC   Collection Time: 04/20/15 11:10 AM  Result Value Ref Range   WBC 6.7 4.0 - 10.5 K/uL   RBC 5.25 (H) 3.87 - 5.11 MIL/uL   Hemoglobin 13.4 12.0 - 15.0 g/dL   HCT 41.1 36.0 - 46.0 %   MCV 78.3 78.0 - 100.0 fL   MCH 25.5 (L) 26.0 - 34.0 pg   MCHC 32.6 30.0 - 36.0 g/dL   RDW 14.8 11.5 - 15.5 %   Platelets 241 150 - 400 K/uL  Type and screen [place]   Collection Time: 04/20/15 11:10 AM  Result Value Ref Range   ABO/RH(D) A POS    Antibody Screen NEG    Sample Expiration 05/04/2015    Extend sample reason NO TRANSFUSIONS OR PREGNANCY IN THE PAST 3 MONTHS  ABO/Rh   Collection Time: 04/20/15 11:10 AM  Result Value Ref Range   ABO/RH(D) A POS     Imaging Studies: 03/16/2015 CLINICAL DATA: Fibroids  EXAM: TRANSABDOMINAL ULTRASOUND OF PELVIS  TECHNIQUE: Transabdominal ultrasound examination of the pelvis was performed including evaluation of the uterus, ovaries, adnexal regions, and pelvic cul-de-sac.  COMPARISON: CT scan 02/13/2013  FINDINGS: Uterus  Measurements: There is enlarged uterus with heterogeneous echogenicity. Measures 20.5 x 11.1 x 13.1 cm. Multiple punctate amorphous calcifications are noted probable calcified fibroids. A anterior fundal fibroid measures at least 5.1 x 5.2 x 3 cm.  There is a midbody anterior fibroid measures 4.3 x 4 x 4.6 cm  Endometrium  Thickness: Not identified distorted by fibroids.  Right ovary  Measurements: Not identified  Left ovary  Measurements: Not visualized  Other findings: No abnormal free fluid.  IMPRESSION: There is heterogeneous enlarged uterus with multiple punctate calcifications probable within fibroids. A anterior fundal fibroid measures at least 5.1 x 5.2 cm. Anterior mid body myometrial fibroid measures 4.6 x 4.3 cm. The endometrial stripe is not identified probable distorted by fibroids. Correlation with GYN exam is recommended.  The ovaries are not visualized. No pelvic free fluid  Assessment: Patient Active Problem List   Diagnosis Date Noted  . Fibroid, uterine 02/07/2015  . Chronic knee pain 12/23/2014  . Tendonitis of wrist, left 12/11/2014  . Stress at home 10/20/2014  . Preventative health care 09/07/2014  . Back pain 06/04/2013  . Tobacco use disorder 05/23/2012    Plan: Patient will undergo surgical management with total abdominal hysterectomy with bilateral salpingo-oophorectomy.   The risks of surgery were discussed in detail with the patient including but not limited to: bleeding which may require transfusion or reoperation; infection which may require antibiotics; injury to surrounding organs which may involve bowel, bladder, ureters ; need for additional procedures including laparoscopy or laparotomy; thromboembolic phenomenon, surgical site problems and other postoperative/anesthesia complications. Likelihood of success in alleviating the patient's condition was discussed. Routine postoperative instructions will be reviewed with the patient and her family in detail after surgery.  The patient concurred with the proposed plan, giving informed written consent for the surgery.  Patient has been NPO since last night she will remain NPO for procedure.  Anesthesia and OR aware.  Preoperative  prophylactic antibiotics and SCDs ordered on call to the OR.  To OR when ready.  Alassane Kalafut L. Harraway-Smith, M.D., [facility] 04/26/2015 8:25 AM

## 2015-04-26 NOTE — Transfer of Care (Signed)
Immediate Anesthesia Transfer of Care Note  Patient: Christy Burke  Procedure(s) Performed: Procedure(s): HYSTERECTOMY ABDOMINAL WITH SALPINGECTOMY (Bilateral)  Patient Location: PACU  Anesthesia Type:General  Level of Consciousness: awake, alert  and oriented  Airway & Oxygen Therapy: Patient Spontanous Breathing and Patient connected to nasal cannula oxygen  Post-op Assessment: Report given to RN and Post -op Vital signs reviewed and stable  Post vital signs: Reviewed and stable  Last Vitals:  Filed Vitals:   04/26/15 0742  BP: 115/95  Pulse: 83  Temp: 36.6 C  Resp: 20    Complications: No apparent anesthesia complications

## 2015-04-27 LAB — CBC
HCT: 30.8 % — ABNORMAL LOW (ref 36.0–46.0)
HEMOGLOBIN: 10 g/dL — AB (ref 12.0–15.0)
MCH: 25 pg — ABNORMAL LOW (ref 26.0–34.0)
MCHC: 32.5 g/dL (ref 30.0–36.0)
MCV: 77 fL — ABNORMAL LOW (ref 78.0–100.0)
Platelets: 211 10*3/uL (ref 150–400)
RBC: 4 MIL/uL (ref 3.87–5.11)
RDW: 14.8 % (ref 11.5–15.5)
WBC: 11.3 10*3/uL — AB (ref 4.0–10.5)

## 2015-04-27 LAB — BASIC METABOLIC PANEL
ANION GAP: 7 (ref 5–15)
BUN: 10 mg/dL (ref 6–20)
CHLORIDE: 104 mmol/L (ref 101–111)
CO2: 27 mmol/L (ref 22–32)
CREATININE: 0.5 mg/dL (ref 0.44–1.00)
Calcium: 8.8 mg/dL — ABNORMAL LOW (ref 8.9–10.3)
GFR calc non Af Amer: >60 mL/min (ref >60)
Glucose, Bld: 132 mg/dL — ABNORMAL HIGH (ref 65–99)
POTASSIUM: 3.9 mmol/L (ref 3.5–5.1)
SODIUM: 138 mmol/L (ref 135–145)

## 2015-04-27 NOTE — Progress Notes (Addendum)
1 Day Post-Op Procedure(s) (LRB): HYSTERECTOMY ABDOMINAL WITH bilateral salpingo-oophorectomy   Subjective: Patient reports tolerating PO, + flatus and no problems voiding.  She c/o incisional pain.  But, is ambulating without difficulty. No vaginal bleeding   Objective: I have reviewed patient's vital signs, intake and output, medications, labs and pathology.   I/O last 3 completed shifts: In: 4734.1 [P.O.:720; I.V.:4014.1] Out: 3500 [Urine:2850; Blood:650] Total I/O In: -  Out: 250 [Urine:250]  General: alert and no distress Resp: clear to auscultation bilaterally Cardio: regular rate and rhythm, S1, S2 normal, no murmur, click, rub or gallop GI: soft, non-tender; bowel sounds normal; no masses,  no organomegaly and incision: clean and dry Extremities: extremities normal, atraumatic, no cyanosis or edema  CBC    Component Value Date/Time   WBC 11.3* 04/27/2015 0558   RBC 4.00 04/27/2015 0558   HGB 10.0* 04/27/2015 0558   HCT 30.8* 04/27/2015 0558   PLT 211 04/27/2015 0558   MCV 77.0* 04/27/2015 0558   MCH 25.0* 04/27/2015 0558   MCHC 32.5 04/27/2015 0558   RDW 14.8 04/27/2015 0558   LYMPHSABS 2.5 03/05/2010 2222   MONOABS 0.8 03/05/2010 2222   EOSABS 0.2 03/05/2010 2222   BASOSABS 0.0 03/05/2010 2222     Assessment: S/p TAH with BSO.  Doing well post op  Plan: Advance diet Encourage ambulation Discontinue IV fluids  Anticipate d/c in am  LOS: 1 day    HARRAWAY-SMITH, Briyana Badman 04/27/2015, 1:00 PM

## 2015-04-28 MED ORDER — HYDROCODONE-ACETAMINOPHEN 5-325 MG PO TABS: 1.0000 | ORAL_TABLET | ORAL | Status: DC | PRN

## 2015-04-28 MED ORDER — HYDROCODONE-ACETAMINOPHEN 5-325 MG PO TABS
1.0000 | ORAL_TABLET | ORAL | Status: DC | PRN
  Administered 2015-04-28 (×2): 2 via ORAL
  Filled 2015-04-28 (×2): qty 2

## 2015-04-28 MED ORDER — DOCUSATE SODIUM 100 MG PO CAPS: 100.0000 mg | ORAL_CAPSULE | Freq: Two times a day (BID) | ORAL | Status: DC

## 2015-04-28 MED ORDER — IBUPROFEN 600 MG PO TABS: 600.0000 mg | ORAL_TABLET | Freq: Four times a day (QID) | ORAL | Status: DC | PRN

## 2015-04-28 NOTE — Progress Notes (Signed)
Patient discharged home with significant other... Discharge instructions reviewed with patient and she verbalized understanding... Condition stable... No equipment... Ambulated to car with C. Riley, NT.  

## 2015-04-28 NOTE — Discharge Summary (Signed)
Physician Discharge Summary  Patient ID: Christy Burke MRN: [id number] DOB/AGE: Jul 28, 1960 55 y.o.  Admit date: 04/26/2015 Discharge date: 04/28/2015  Admission Diagnoses:  Discharge Diagnoses:  Principal Problem:   Fibroid, uterine Active Problems:   Abnormal uterine bleeding (AUB)   Post-operative state   Discharged Condition: good  Hospital Course: Patient had an uncomplicated surgery; for further details of this surgery, please refer to the operative note. Furthermore, the patient had an uncomplicated postoperative course.  By time of discharge, her pain was controlled on oral pain medications; she was ambulating, voiding without difficulty, tolerating regular diet and passing flatus.  She was deemed stable for discharge to home.    Consults: None  Significant Diagnostic Studies: labs: CBC and BMP  Treatments: IV hydration, analgesia: Vicodin and surgery: TAH/BSO  Discharge Exam: Blood pressure 121/68, pulse 68, temperature 98.4 F (36.9 C), temperature source Oral, resp. rate 20, height 5\' 7"  (1.702 m), weight 172 lb (78.019 kg), SpO2 96 %. General appearance: alert and no distress Resp: clear to auscultation bilaterally Cardio: regular rate and rhythm, S1, S2 normal, no murmur, click, rub or gallop GI: soft, non-tender; bowel sounds normal; no masses,  no organomegaly Extremities: extremities normal, atraumatic, no cyanosis or edema Incision/Wound:clean and dry  Disposition: 01-Home or Self Care     Medication List    ASK your doctor about these medications        albuterol 108 (90 Base) MCG/ACT inhaler  Commonly known as:  PROVENTIL HFA;VENTOLIN HFA  Inhale 2 puffs into the lungs every 6 (six) hours as needed for wheezing or shortness of breath.     ibuprofen 200 MG tablet  Commonly known as:  ADVIL,MOTRIN  Take 200 mg by mouth every 6 (six) hours as needed for moderate pain. Reported on 03/24/2015     oxyCODONE-acetaminophen 5-325 MG tablet  Commonly known  as:  PERCOCET/ROXICET  Take 1-2 tablets by mouth every 4 (four) hours as needed for severe pain.     VITAMIN C PO  Take 1 tablet by mouth daily.           Follow-up Information    Follow up with Lavonia Drafts, MD In 2 weeks.   Specialty:  Obstetrics and Gynecology   Contact information:   [place] [address] [phone]       Signed: Lavonia Drafts 04/28/2015, 9:30 AM

## 2015-04-28 NOTE — Discharge Instructions (Signed)
Abdominal Hysterectomy, Care After °Refer to this sheet in the next few weeks. These instructions provide you with information on caring for yourself after your procedure. Your health care provider may also give you more specific instructions. Your treatment has been planned according to current medical practices, but problems sometimes occur. Call your health care provider if you have any problems or questions after your procedure.  °WHAT TO EXPECT AFTER THE PROCEDURE °After your procedure, it is typical to have the following: °· Pain. °· Feeling tired. °· Poor appetite. °· Less interest in sex. °It takes 4-6 weeks to recover from this surgery.  °HOME CARE INSTRUCTIONS  °· Take pain medicines only as directed by your health care provider. Do not take over-the-counter pain medicines without checking with your health care provider first.  °· Change your bandage as directed by your health care provider. °· Return to your health care provider to have your sutures taken out. °· Take showers instead of baths for 2-3 weeks. Ask your health care provider when it is safe to start showering.  °· Do not douche, use tampons, or have sexual intercourse for at least 6 weeks or until your health care provider says you can.   °· Follow your health care provider's advice about exercise, lifting, driving, and general activities. °· Get plenty of rest and sleep.   °· Do not lift anything heavier than a gallon of milk (about 10 lb [4.5 kg]) for the first month after surgery. °· You can resume your normal diet if your health care provider says it is okay.   °· Do not drink alcohol until your health care provider says you can.   °· If you are constipated, ask your health care provider if you can take a mild laxative. °· Eating foods high in fiber may also help with constipation. Eat plenty of raw fruits and vegetables, whole grains, and beans. °· Drink enough fluids to keep your urine clear or pale yellow.   °· Try to have someone at  home with you for the first 1-2 weeks to help around the house. °· Keep all follow-up appointments. °SEEK MEDICAL CARE IF:  °· You have chills or fever. °· You have swelling, redness, or pain in the area of your incision that is getting worse.   °· You have pus coming from the incision.   °· You notice a bad smell coming from the incision or bandage.   °· Your incision breaks open.   °· You feel dizzy or light-headed.   °· You have pain or bleeding when you urinate.   °· You have persistent diarrhea.   °· You have persistent nausea and vomiting.   °· You have abnormal vaginal discharge.   °· You have a rash.   °· You have any type of abnormal reaction or develop an allergy to your medicine.   °· Your pain medicine is not helping.   °SEEK IMMEDIATE MEDICAL CARE IF:  °· You have a fever and your symptoms suddenly get worse. °· You have severe abdominal pain. °· You have chest pain. °· You have shortness of breath. °· You faint. °· You have pain, swelling, or redness of your leg. °· You have heavy vaginal bleeding with blood clots. °MAKE SURE YOU: °· Understand these instructions. °· Will watch your condition. °· Will get help right away if you are not doing well or get worse. °  °This information is not intended to replace advice given to you by your health care provider. Make sure you discuss any questions you have with your health care provider. °  °Document   Released: 09/08/2004 Document Revised: 03/12/2014 Document Reviewed: 12/12/2012 °Elsevier Interactive Patient Education ©2016 [employer] ° °

## 2015-05-02 ENCOUNTER — Telehealth: Payer: Self-pay

## 2015-05-02 NOTE — Telephone Encounter (Signed)
Pt came today requesting a higher dose of pain medicine. She is currently taking Hydrocodone-acetaminophen 5-325.Can we call in something else?

## 2015-05-12 ENCOUNTER — Ambulatory Visit (INDEPENDENT_AMBULATORY_CARE_PROVIDER_SITE_OTHER): Payer: No Typology Code available for payment source | Admitting: Obstetrics & Gynecology

## 2015-05-12 ENCOUNTER — Encounter: Payer: Self-pay | Admitting: Obstetrics & Gynecology

## 2015-05-12 VITALS — BP 121/97 | HR 98 | Temp 98.4°F | Wt 169.3 lb

## 2015-05-12 DIAGNOSIS — Z9889 Other specified postprocedural states: Secondary | ICD-10-CM

## 2015-05-12 MED ORDER — IBUPROFEN 600 MG PO TABS: 600.0000 mg | ORAL_TABLET | Freq: Four times a day (QID) | ORAL | Status: DC | PRN

## 2015-05-12 NOTE — Progress Notes (Signed)
Patient ID: Christy Burke, female   DOB: 12-31-1960, 55 y.o.   MRN: [id number] History:  55 y.o. here today for 2 week post op check from TAH with BSO. Pt is voiding and passing stool and flatus without difficulty.  She ran out of NSAIDS last night and wants a refill.      The following portions of the patient's history were reviewed and updated as appropriate: allergies, current medications, past family history, past medical history, past social history, past surgical history and problem list.  Review of Systems:  Pertinent items are noted in HPI.  Objective:  Physical Exam Blood pressure 121/97, pulse 98, temperature 98.4 F (36.9 C), temperature source Oral, weight 169 lb 4.8 oz (76.794 kg), last menstrual period 03/14/2015. Gen: NAD Abd: Soft, nontender and nondistended. Incision clean and dry.  Steristrips removed. Pelvic: deferred  Labs and Imaging 04/26/2015 Diagnosis Uterus, ovaries and fallopian tubes, cervix - CERVIX: NABOTHIAN CYST AND SQUAMOUS METAPLASIA. - ENDOMETRIUM: INACTIVE. NO EVIDENCE OF HYPERPLASIA OR MALIGNANCY. - MYOMETRIUM: LEIOMYOMATA WITH FOCAL DEGENERATIVE CHANGES. NO EVIDENCE OF MALIGNANCY. - UTERINE SEROSA: FOCAL ADHESIONS. NO ENDOMETRIOSIS OR MALIGNANCY. - BILATERAL OVARIES: FOCAL ENDOSALPINGIOSIS. NO ENDOMETRIOSIS OR MALIGNANCY. - BILATERAL FALLOPIAN TUBES: UNREMARKABLE. NO ENDOMETRIOSIS OR MALIGNANCY.  Assessment & Plan:  2 week post op check- doing well  F/u in 4 week Pt declines ERT at present.  Pt will call if she wants to start it before her next visit.   Joslin Doell L. Harraway-Smith, M.D., [facility]

## 2015-05-12 NOTE — Patient Instructions (Signed)
Abdominal Hysterectomy, Care After °Refer to this sheet in the next few weeks. These instructions provide you with information on caring for yourself after your procedure. Your health care provider may also give you more specific instructions. Your treatment has been planned according to current medical practices, but problems sometimes occur. Call your health care provider if you have any problems or questions after your procedure.  °WHAT TO EXPECT AFTER THE PROCEDURE °After your procedure, it is typical to have the following: °· Pain. °· Feeling tired. °· Poor appetite. °· Less interest in sex. °It takes 4-6 weeks to recover from this surgery.  °HOME CARE INSTRUCTIONS  °· Take pain medicines only as directed by your health care provider. Do not take over-the-counter pain medicines without checking with your health care provider first.  °· Change your bandage as directed by your health care provider. °· Return to your health care provider to have your sutures taken out. °· Take showers instead of baths for 2-3 weeks. Ask your health care provider when it is safe to start showering.  °· Do not douche, use tampons, or have sexual intercourse for at least 6 weeks or until your health care provider says you can.   °· Follow your health care provider's advice about exercise, lifting, driving, and general activities. °· Get plenty of rest and sleep.   °· Do not lift anything heavier than a gallon of milk (about 10 lb [4.5 kg]) for the first month after surgery. °· You can resume your normal diet if your health care provider says it is okay.   °· Do not drink alcohol until your health care provider says you can.   °· If you are constipated, ask your health care provider if you can take a mild laxative. °· Eating foods high in fiber may also help with constipation. Eat plenty of raw fruits and vegetables, whole grains, and beans. °· Drink enough fluids to keep your urine clear or pale yellow.   °· Try to have someone at  home with you for the first 1-2 weeks to help around the house. °· Keep all follow-up appointments. °SEEK MEDICAL CARE IF:  °· You have chills or fever. °· You have swelling, redness, or pain in the area of your incision that is getting worse.   °· You have pus coming from the incision.   °· You notice a bad smell coming from the incision or bandage.   °· Your incision breaks open.   °· You feel dizzy or light-headed.   °· You have pain or bleeding when you urinate.   °· You have persistent diarrhea.   °· You have persistent nausea and vomiting.   °· You have abnormal vaginal discharge.   °· You have a rash.   °· You have any type of abnormal reaction or develop an allergy to your medicine.   °· Your pain medicine is not helping.   °SEEK IMMEDIATE MEDICAL CARE IF:  °· You have a fever and your symptoms suddenly get worse. °· You have severe abdominal pain. °· You have chest pain. °· You have shortness of breath. °· You faint. °· You have pain, swelling, or redness of your leg. °· You have heavy vaginal bleeding with blood clots. °MAKE SURE YOU: °· Understand these instructions. °· Will watch your condition. °· Will get help right away if you are not doing well or get worse. °  °This information is not intended to replace advice given to you by your health care provider. Make sure you discuss any questions you have with your health care provider. °  °Document   Released: 09/08/2004 Document Revised: 03/12/2014 Document Reviewed: 12/12/2012 °Elsevier Interactive Patient Education ©2016 [employer] Inc. ° °

## 2015-06-13 ENCOUNTER — Encounter: Payer: Self-pay | Admitting: Obstetrics & Gynecology

## 2015-06-13 ENCOUNTER — Encounter: Payer: Self-pay | Admitting: *Deleted

## 2015-06-13 ENCOUNTER — Ambulatory Visit (INDEPENDENT_AMBULATORY_CARE_PROVIDER_SITE_OTHER): Payer: No Typology Code available for payment source | Admitting: Obstetrics & Gynecology

## 2015-06-13 VITALS — BP 128/80 | HR 102 | Wt 167.5 lb

## 2015-06-13 DIAGNOSIS — Z9889 Other specified postprocedural states: Secondary | ICD-10-CM

## 2015-06-13 DIAGNOSIS — Z720 Tobacco use: Secondary | ICD-10-CM

## 2015-06-13 NOTE — Patient Instructions (Signed)
Abdominal Hysterectomy, Care After °Refer to this sheet in the next few weeks. These instructions provide you with information on caring for yourself after your procedure. Your health care provider may also give you more specific instructions. Your treatment has been planned according to current medical practices, but problems sometimes occur. Call your health care provider if you have any problems or questions after your procedure.  °WHAT TO EXPECT AFTER THE PROCEDURE °After your procedure, it is typical to have the following: °· Pain. °· Feeling tired. °· Poor appetite. °· Less interest in sex. °It takes 4-6 weeks to recover from this surgery.  °HOME CARE INSTRUCTIONS  °· Take pain medicines only as directed by your health care provider. Do not take over-the-counter pain medicines without checking with your health care provider first.  °· Change your bandage as directed by your health care provider. °· Return to your health care provider to have your sutures taken out. °· Take showers instead of baths for 2-3 weeks. Ask your health care provider when it is safe to start showering.  °· Do not douche, use tampons, or have sexual intercourse for at least 6 weeks or until your health care provider says you can.   °· Follow your health care provider's advice about exercise, lifting, driving, and general activities. °· Get plenty of rest and sleep.   °· Do not lift anything heavier than a gallon of milk (about 10 lb [4.5 kg]) for the first month after surgery. °· You can resume your normal diet if your health care provider says it is okay.   °· Do not drink alcohol until your health care provider says you can.   °· If you are constipated, ask your health care provider if you can take a mild laxative. °· Eating foods high in fiber may also help with constipation. Eat plenty of raw fruits and vegetables, whole grains, and beans. °· Drink enough fluids to keep your urine clear or pale yellow.   °· Try to have someone at  home with you for the first 1-2 weeks to help around the house. °· Keep all follow-up appointments. °SEEK MEDICAL CARE IF:  °· You have chills or fever. °· You have swelling, redness, or pain in the area of your incision that is getting worse.   °· You have pus coming from the incision.   °· You notice a bad smell coming from the incision or bandage.   °· Your incision breaks open.   °· You feel dizzy or light-headed.   °· You have pain or bleeding when you urinate.   °· You have persistent diarrhea.   °· You have persistent nausea and vomiting.   °· You have abnormal vaginal discharge.   °· You have a rash.   °· You have any type of abnormal reaction or develop an allergy to your medicine.   °· Your pain medicine is not helping.   °SEEK IMMEDIATE MEDICAL CARE IF:  °· You have a fever and your symptoms suddenly get worse. °· You have severe abdominal pain. °· You have chest pain. °· You have shortness of breath. °· You faint. °· You have pain, swelling, or redness of your leg. °· You have heavy vaginal bleeding with blood clots. °MAKE SURE YOU: °· Understand these instructions. °· Will watch your condition. °· Will get help right away if you are not doing well or get worse. °  °This information is not intended to replace advice given to you by your health care provider. Make sure you discuss any questions you have with your health care provider. °  °Document   Released: 09/08/2004 Document Revised: 03/12/2014 Document Reviewed: 12/12/2012 °Elsevier Interactive Patient Education ©2016 [employer] Inc. ° °

## 2015-06-13 NOTE — Progress Notes (Signed)
Patient ID: Christy Burke, female   DOB: 03/21/1960, 55 y.o.   MRN: [id number] History:  55 y.o. No obstetric history on file. here today for 6 week post op following TAH/BSO.  Pt reports still having soreness and pain. Has not been driving yet.  She is voiding and passing stools without difficulty.  She has had no bleeding.  The following portions of the patient's history were reviewed and updated as appropriate: allergies, current medications, past family history, past medical history, past social history, past surgical history and problem list.  Review of Systems:  Pertinent items are noted in HPI.  Objective:  Physical Exam Last menstrual period 03/14/2015. Gen: NAD Abd: Soft, nontender and nondistended; diffusely tender but, when pt relaxes her exam is normal.  Her incision is clean and dry. Pelvic: Normal appearing external genitalia; normal appearing vaginal mucosa.  Cuff well healed.  Cervix and uterus surgically absent.  Normal discharge.    Labs and Imaging 04/26/2015 Diagnosis Uterus, ovaries and fallopian tubes, cervix - CERVIX: NABOTHIAN CYST AND SQUAMOUS METAPLASIA. - ENDOMETRIUM: INACTIVE. NO EVIDENCE OF HYPERPLASIA OR MALIGNANCY. - MYOMETRIUM: LEIOMYOMATA WITH FOCAL DEGENERATIVE CHANGES. NO EVIDENCE OF MALIGNANCY. - UTERINE SEROSA: FOCAL ADHESIONS. NO ENDOMETRIOSIS OR MALIGNANCY. - BILATERAL OVARIES: FOCAL ENDOSALPINGIOSIS. NO ENDOMETRIOSIS OR MALIGNANCY. - BILATERAL FALLOPIAN TUBES: UNREMARKABLE. NO ENDOMETRIOSIS OR MALIGNANCY.  Assessment & Plan:  6 weeks post op check- doing well May return to work at 8 weeks with no restrictions.   F/u in 3 months or sooner prn  Halen Mossbarger L. Harraway-Smith, M.D., [facility]

## 2015-08-03 ENCOUNTER — Encounter (HOSPITAL_COMMUNITY): Payer: Self-pay | Admitting: Emergency Medicine

## 2015-08-03 ENCOUNTER — Ambulatory Visit (HOSPITAL_COMMUNITY)
Admission: EM | Admit: 2015-08-03 | Discharge: 2015-08-03 | Disposition: A | Discharge status: Home / Self Care | Payer: No Typology Code available for payment source | Attending: Family Medicine | Admitting: Family Medicine

## 2015-08-03 DIAGNOSIS — A0811 Acute gastroenteropathy due to Norwalk agent: Secondary | ICD-10-CM | POA: Diagnosis not present

## 2015-08-03 MED ORDER — ONDANSETRON HCL 4 MG/2ML IJ SOLN
INTRAMUSCULAR | Status: AC
  Filled 2015-08-03: qty 2

## 2015-08-03 MED ORDER — ONDANSETRON HCL 4 MG/2ML IJ SOLN
4.0000 mg | Freq: Once | INTRAMUSCULAR | Status: AC
  Administered 2015-08-03: 4 mg via INTRAVENOUS

## 2015-08-03 MED ORDER — SODIUM CHLORIDE 0.9 % IV BOLUS (SEPSIS)
1000.0000 mL | Freq: Once | INTRAVENOUS | Status: AC
  Administered 2015-08-03: 1000 mL via INTRAVENOUS

## 2015-08-03 MED ORDER — ONDANSETRON HCL 4 MG PO TABS: 4.0000 mg | ORAL_TABLET | Freq: Four times a day (QID) | ORAL | Status: DC

## 2015-08-03 NOTE — Discharge Instructions (Signed)
Clear liquid , bland diet tonight as tolerated, advance on thurs as improved, use medicine as needed, return or see your doctor if any problems.

## 2015-08-03 NOTE — ED Notes (Addendum)
The patient presented to the [facility] with a complaint of N/V/D x 2 days. The patient stated that she has not been able to eat solid foods but has been able to keep down some clear liquids. The patient stated that she has a sharp abdominal pain in the center of her abdomen.

## 2015-08-03 NOTE — ED Provider Notes (Addendum)
CSN: [id number]     Arrival date & time 08/03/15  1623 History   First MD Initiated Contact with Patient 08/03/15 1704     Chief Complaint  Patient presents with  . Nausea  . Emesis  . Diarrhea   (Consider location/radiation/quality/duration/timing/severity/associated sxs/prior Treatment) Patient is a 55 y.o. female presenting with vomiting. The history is provided by the patient.  Emesis Severity:  Moderate Duration:  2 days Quality:  Stomach contents Able to tolerate:  Liquids Progression:  Unchanged Chronicity:  New Recent urination:  Normal Relieved by:  None tried Worsened by:  Nothing tried Ineffective treatments:  None tried Associated symptoms: abdominal pain and diarrhea   Associated symptoms: no chills and no fever   Risk factors: no sick contacts and no suspect food intake     Past Medical History  Diagnosis Date  . Renal stones   . Asthma   . History of umbilical hernia    Past Surgical History  Procedure Laterality Date  . Umbilical hernia repair  1990  . Lithotrypsy     Family History  Problem Relation Age of Onset  . Cancer Mother   . Diabetes Father   . Colon cancer Neg Hx    Social History  Substance Use Topics  . Smoking status: Current Every Day Smoker -- 1.00 packs/day for 39 years    Types: Cigarettes  . Smokeless tobacco: Never Used     Comment: Has set a day.  . 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 Negative for fever and chills.  HENT: Negative.   Gastrointestinal: Positive for nausea, vomiting, abdominal pain and diarrhea. Negative for blood in stool.  Genitourinary: Negative.   All other systems reviewed and are negative.   Allergies  Review of patient's allergies indicates no known allergies.  Home Medications   Prior to Admission medications   Medication Sig  Start Date End Date Taking? Authorizing Provider  Ascorbic Acid (VITAMIN C PO) Take 1 tablet by mouth daily. Reported on 06/13/2015   Yes Historical Provider, MD  albuterol (PROVENTIL HFA;VENTOLIN HFA) 108 (90 BASE) MCG/ACT inhaler Inhale 2 puffs into the lungs every 6 (six) hours as needed for wheezing or shortness of breath. 06/28/14   Gregor Hams, MD  docusate sodium (COLACE) 100 MG capsule Take 1 capsule (100 mg total) by mouth 2 (two) times daily. Patient not taking: Reported on 06/13/2015 04/28/15   Lavonia Drafts, MD  HYDROcodone-acetaminophen (NORCO/VICODIN) 5-325 MG tablet Take 1-2 tablets by mouth every 4 (four) hours as needed for moderate pain or severe pain. Patient not taking: Reported on 05/12/2015 04/28/15   Lavonia Drafts, MD  ibuprofen (ADVIL,MOTRIN) 600 MG tablet Take 1 tablet (600 mg total) by mouth every 6 (six) hours as needed (mild pain). Patient not taking: Reported on 06/13/2015 05/12/15   Lavonia Drafts, MD  ondansetron (ZOFRAN) 4 MG tablet Take 1 tablet (4 mg total) by mouth every 6 (six) hours. Prn n/v. 08/03/15   Billy Fischer, MD   Meds Ordered and Administered this Visit   Medications  ondansetron [facility]) injection 4 mg (4 mg Intravenous Given 08/03/15 1744)  sodium chloride 0.9 % bolus 1,000 mL (1,000 mLs Intravenous Given 08/03/15 1737)    BP 118/81 mmHg  Pulse 97  Temp(Src) 98.3 F (36.8 C) (Oral)  Resp 20  SpO2 97%  LMP 03/14/2015 (Approximate) No data found.   Physical Exam  Constitutional: She is oriented to person, place, and time. She appears well-developed and well-nourished. No distress.  HENT:  Mouth/Throat: Oropharynx is clear and moist.  Neck: Normal range of motion. Neck supple.  Abdominal: Soft. Bowel sounds are normal. She exhibits no distension and no mass. There is tenderness. There is no rebound and no guarding.  Lymphadenopathy:    She has no cervical adenopathy.  Neurological: She is alert and oriented to person,  place, and time.  Skin: Skin is warm and dry.  Nursing note and vitals reviewed.   ED Course  Procedures (including critical care time)  Labs Review Labs Reviewed - No data to display  Imaging Review No results found.   Visual Acuity Review  Right Eye Distance:   Left Eye Distance:   Bilateral Distance:    Right Eye Near:   Left Eye Near:    Bilateral Near:         MDM   1. Gastroenteritis due to norovirus    Sx improved with fluids at time of d/c.   Billy Fischer, MD 08/03/15 [id number]  Billy Fischer, MD 08/03/15 [phone]

## 2015-08-08 ENCOUNTER — Telehealth: Payer: Self-pay | Admitting: Obstetrics & Gynecology

## 2015-08-08 ENCOUNTER — Other Ambulatory Visit: Payer: Self-pay

## 2015-08-08 MED ORDER — ESTRADIOL 1 MG PO TABS: 1.0000 mg | ORAL_TABLET | Freq: Every day | ORAL | Status: DC

## 2015-08-08 NOTE — Telephone Encounter (Signed)
Questions about medication

## 2015-08-08 NOTE — Telephone Encounter (Signed)
Pt would like to try the estrogen pills Dr.CHS discuss when she had her hysterometry. Will discuss with provider this afternoon.

## 2015-08-08 NOTE — Telephone Encounter (Signed)
Per [facility] patient can start taken estrace once per day. She has a follow up on 08/15/2015 to discuss

## 2015-08-08 NOTE — Telephone Encounter (Signed)
Error

## 2015-08-18 ENCOUNTER — Encounter: Payer: Self-pay | Admitting: Obstetrics & Gynecology

## 2015-08-18 ENCOUNTER — Ambulatory Visit (INDEPENDENT_AMBULATORY_CARE_PROVIDER_SITE_OTHER): Payer: No Typology Code available for payment source | Admitting: Obstetrics & Gynecology

## 2015-08-18 VITALS — BP 118/80 | HR 86 | Wt 161.0 lb

## 2015-08-18 DIAGNOSIS — Z1239 Encounter for other screening for malignant neoplasm of breast: Secondary | ICD-10-CM

## 2015-08-18 DIAGNOSIS — Z716 Tobacco abuse counseling: Secondary | ICD-10-CM | POA: Diagnosis not present

## 2015-08-18 DIAGNOSIS — Z79890 Hormone replacement therapy (postmenopausal): Secondary | ICD-10-CM | POA: Diagnosis not present

## 2015-08-18 MED ORDER — ESTRADIOL 1 MG PO TABS: 1.0000 mg | ORAL_TABLET | Freq: Every day | ORAL | Status: DC

## 2015-08-18 NOTE — Patient Instructions (Signed)
Hormone Therapy At menopause, your body begins making less estrogen and progesterone hormones. This causes the body to stop having menstrual periods. This is because estrogen and progesterone hormones control your periods and menstrual cycle. A lack of estrogen may cause symptoms such as:  Hot flushes (or hot flashes).  Vaginal dryness.  Dry skin.  Loss of sex drive.  Risk of bone loss (osteoporosis). When this happens, you may choose to take hormone therapy to get back the estrogen lost during menopause. When the hormone estrogen is given alone, it is usually referred to as ET (Estrogen Therapy). When the hormone progestin is combined with estrogen, it is generally called HT (Hormone Therapy). This was formerly known as hormone replacement therapy (HRT). Your caregiver can help you make a decision on what will be best for you. The decision to use HT seems to change often as new studies are done. Many studies do not agree on the benefits of hormone replacement therapy. LIKELY BENEFITS OF HT INCLUDE PROTECTION FROM:  Hot Flushes (also called hot flashes) - A hot flush is a sudden feeling of heat that spreads over the face and body. The skin may redden like a blush. It is connected with sweats and sleep disturbance. Women going through menopause may have hot flushes a few times a month or several times per day depending on the woman.  Osteoporosis (bone loss) - Estrogen helps guard against bone loss. After menopause, a woman's bones slowly lose calcium and become weak and brittle. As a result, bones are more likely to break. The hip, wrist, and spine are affected most often. Hormone therapy can help slow bone loss after menopause. Weight bearing exercise and taking calcium with vitamin D also can help prevent bone loss. There are also medications that your caregiver can prescribe that can help prevent osteoporosis.  Vaginal dryness - Loss of estrogen causes changes in the vagina. Its lining may  become thin and dry. These changes can cause pain and bleeding during sexual intercourse. Dryness can also lead to infections. This can cause burning and itching. (Vaginal estrogen treatment can help relieve pain, itching, and dryness.)  Urinary tract infections are more common after menopause because of lack of estrogen. Some women also develop urinary incontinence because of low estrogen levels in the vagina and bladder.  Possible other benefits of estrogen include a positive effect on mood and short-term memory in women. RISKS AND COMPLICATIONS  Using estrogen alone without progesterone causes the lining of the uterus to grow. This increases the risk of lining of the uterus (endometrial) cancer. Your caregiver should give another hormone called progestin if you have a uterus.  Women who take combined (estrogen and progestin) HT appear to have an increased risk of breast cancer. The risk appears to be small, but increases throughout the time that HT is taken.  Combined therapy also makes the breast tissue slightly denser which makes it harder to read mammograms (breast X-rays).  Combined, estrogen and progesterone therapy can be taken together every day, in which case there may be spotting of blood. HT therapy can be taken cyclically in which case you will have menstrual periods. Cyclically means HT is taken for a set amount of days, then not taken, then this process is repeated.  HT may increase the risk of stroke, heart attack, breast cancer and forming blood clots in your leg.  Transdermal estrogen (estrogen that is absorbed through the skin with a patch or a cream) may have better results with:  Cholesterol.  Blood pressure.  Blood clots. Having the following conditions may indicate you should not have HT:  Endometrial cancer.  Liver disease.  Breast cancer.  Heart disease.  History of blood clots.  Stroke. TREATMENT   If you choose to take HT and have a uterus, usually  estrogen and progestin are prescribed.  Your caregiver will help you decide the best way to take the medications.  Possible ways to take estrogen include:  Pills.  Patches.  Gels.  Sprays.  Vaginal estrogen cream, rings and tablets.  It is best to take the lowest dose possible that will help your symptoms and take them for the shortest period of time that you can.  Hormone therapy can help relieve some of the problems (symptoms) that affect women at menopause. Before making a decision about HT, talk to your caregiver about what is best for you. Be well informed and comfortable with your decisions. HOME CARE INSTRUCTIONS   Follow your caregivers advice when taking the medications.  A Pap test is done to screen for cervical cancer.  The first Pap test should be done at age 34.  Between ages 80 and 52, Pap tests are repeated every 2 years.  Beginning at age 13, you are advised to have a Pap test every 3 years as long as the past 3 Pap tests have been normal.  Some women have medical problems that increase the chance of getting cervical cancer. Talk to your caregiver about these problems. It is especially important to talk to your caregiver if a new problem develops soon after your last Pap test. In these cases, your caregiver may recommend more frequent screening and Pap tests.  The above recommendations are the same for women who have or have not gotten the vaccine for HPV (human papillomavirus).  If you had a hysterectomy for a problem that was not a cancer or a condition that could lead to cancer, then you no longer need Pap tests. However, even if you no longer need a Pap test, a regular exam is a good idea to make sure no other problems are starting.  If you are between ages 20 and 60, and you have had normal Pap tests going back 10 years, you no longer need Pap tests. However, even if you no longer need a Pap test, a regular exam is a good idea to make sure no other problems  are starting.  If you have had past treatment for cervical cancer or a condition that could lead to cancer, you need Pap tests and screening for cancer for at least 20 years after your treatment.  If Pap tests have been discontinued, risk factors (such as a new sexual partner)need to be re-assessed to determine if screening should be resumed.  Some women may need screenings more often if they are at high risk for cervical cancer.  Get mammograms done as per the advice of your caregiver. SEEK IMMEDIATE MEDICAL CARE IF:  You develop abnormal vaginal bleeding.  You have pain or swelling in your legs, shortness of breath, or chest pain.  You develop dizziness or headaches.  You have lumps or changes in your breasts or armpits.  You have slurred speech.  You develop weakness or numbness of your arms or legs.  You have pain, burning, or bleeding when urinating.  You develop abdominal pain.   This information is not intended to replace advice given to you by your health care provider. Make sure you discuss any questions  you have with your health care provider.   Document Released: 11/18/2002 Document Revised: 07/06/2014 Document Reviewed: 08/23/2014 Elsevier Interactive Patient Education 2016 [employer]  

## 2015-08-18 NOTE — Progress Notes (Signed)
Patient ID: Genel Kucera, female   DOB: 1960/11/02, 55 y.o.   MRN: [id number] History:  55 y.o. No obstetric history on file. Here today for f/u of HRT. Pt initially declined tx but, reports that her sx of vasomotor skills became severe.  She started Estrace 4 days prev and reports that her sx have already improved.  She reports a >30# weight loss since surgery. She is exercising and trying to stop smoking.     The following portions of the patient's history were reviewed and updated as appropriate: allergies, current medications, past family history, past medical history, past social history, past surgical history and problem list.  Mother had breast cancer dx'd in her 53's  Review of Systems:  Pertinent items are noted in HPI.  Objective:  Physical Exam Blood pressure 118/80, pulse 86, weight 161 lb (73.029 kg), last menstrual period 03/14/2015. Gen: NAD Exam deferred   Assessment & Plan:   Patient with bothersome menopausal vasomotor symptoms. Discussed lifestyle interventions such as wearing light clothing, remaining in cool environments, having fan/air conditioner in the room, avoiding hot beverages etc.  Discussed using hormone therapy and concerns about increased risk of heart disease, cerebrovascular disease, thromboembolic disease,  and breast cancer.  Also discussed other medical options such as Paxil, Effexor or Neurontin.   Also discussed alternative therapies such as herbal remedies but cautioned that most of the products contained phytoestrogens (plant estrogens) in unregulated amounts which can have the same effects on the body as the pharmaceutical estrogen preparations.   Estrace 1 mg F/u in 6 months or sooner prn D/w pt tobacco cessation  Mat Stuard L. Harraway-Smith, M.D., Cherlynn June

## 2015-09-21 ENCOUNTER — Encounter: Payer: Self-pay | Admitting: Internal Medicine

## 2015-09-21 DIAGNOSIS — Z9079 Acquired absence of other genital organ(s): Secondary | ICD-10-CM

## 2015-09-21 DIAGNOSIS — Z90710 Acquired absence of both cervix and uterus: Secondary | ICD-10-CM | POA: Insufficient documentation

## 2015-09-21 DIAGNOSIS — Z90722 Acquired absence of ovaries, bilateral: Secondary | ICD-10-CM

## 2015-09-27 ENCOUNTER — Encounter (INDEPENDENT_AMBULATORY_CARE_PROVIDER_SITE_OTHER): Payer: Self-pay

## 2015-09-27 ENCOUNTER — Encounter: Payer: Self-pay | Admitting: Internal Medicine

## 2015-09-27 ENCOUNTER — Ambulatory Visit (INDEPENDENT_AMBULATORY_CARE_PROVIDER_SITE_OTHER): Payer: No Typology Code available for payment source | Admitting: Internal Medicine

## 2015-09-27 VITALS — BP 109/79 | HR 105 | Temp 98.2°F | Ht 65.0 in | Wt 157.8 lb

## 2015-09-27 DIAGNOSIS — F17210 Nicotine dependence, cigarettes, uncomplicated: Secondary | ICD-10-CM | POA: Diagnosis not present

## 2015-09-27 DIAGNOSIS — Z206 Contact with and (suspected) exposure to human immunodeficiency virus [HIV]: Secondary | ICD-10-CM | POA: Diagnosis not present

## 2015-09-27 DIAGNOSIS — Z90710 Acquired absence of both cervix and uterus: Secondary | ICD-10-CM

## 2015-09-27 DIAGNOSIS — Z114 Encounter for screening for human immunodeficiency virus [HIV]: Secondary | ICD-10-CM

## 2015-09-27 DIAGNOSIS — M205X1 Other deformities of toe(s) (acquired), right foot: Secondary | ICD-10-CM

## 2015-09-27 DIAGNOSIS — F17200 Nicotine dependence, unspecified, uncomplicated: Secondary | ICD-10-CM

## 2015-09-27 DIAGNOSIS — R7303 Prediabetes: Secondary | ICD-10-CM | POA: Diagnosis not present

## 2015-09-27 DIAGNOSIS — Z9079 Acquired absence of other genital organ(s): Secondary | ICD-10-CM

## 2015-09-27 DIAGNOSIS — M79674 Pain in right toe(s): Secondary | ICD-10-CM | POA: Diagnosis not present

## 2015-09-27 DIAGNOSIS — Z90722 Acquired absence of ovaries, bilateral: Secondary | ICD-10-CM

## 2015-09-27 LAB — POCT GLYCOSYLATED HEMOGLOBIN (HGB A1C): Hemoglobin A1C: 5.8

## 2015-09-27 LAB — GLUCOSE, CAPILLARY: Glucose-Capillary: 90 mg/dL (ref 65–99)

## 2015-09-27 MED ORDER — IBUPROFEN 600 MG PO TABS: 600.0000 mg | ORAL_TABLET | Freq: Four times a day (QID) | ORAL | 0 refills | Status: DC | PRN

## 2015-09-27 NOTE — Patient Instructions (Signed)
Take Ibuprofen as instructed for toe pain.  Return for a follow-up visit in 6 months.

## 2015-09-28 LAB — HIV ANTIBODY (ROUTINE TESTING W REFLEX): HIV SCREEN 4TH GENERATION: NONREACTIVE

## 2015-09-29 DIAGNOSIS — Z114 Encounter for screening for human immunodeficiency virus [HIV]: Secondary | ICD-10-CM | POA: Insufficient documentation

## 2015-09-29 DIAGNOSIS — R7303 Prediabetes: Secondary | ICD-10-CM | POA: Insufficient documentation

## 2015-09-29 DIAGNOSIS — M79676 Pain in unspecified toe(s): Secondary | ICD-10-CM | POA: Insufficient documentation

## 2015-09-29 NOTE — Progress Notes (Signed)
   CC: Pain in toe  HPI:  [name] is a 55 y.o. female with a past medical history of conditions listed below presenting to the clinic complaining of pain and swelling in her right big toe. States her symptoms started 1 week ago and are gradually getting worse. States the toe does not feel hot or red. Denies any injury to the area. Does report standing on her feet for long periods of hours at work and wears boots at work. States her right big toe was swollen and painful a couple years ago and she is concerned she might have gout. Denies consuming alcohol or eating red meat. Denies having any other joint pain/swelling. Denies noticing any skin rash.  Please see problem based charting for the status of the patient's chronic medical conditions.  Past Medical History:  Diagnosis Date  . Asthma   . History of umbilical hernia   . Renal stones     Review of Systems:  Pertinent positives mentioned in history of present illness. Remainder of all review of systems negative.  Physical Exam:  Vitals:   09/27/15 1410  BP: 109/79  Pulse: (!) 105  Temp: 98.2 F (36.8 C)  TempSrc: Oral  SpO2: 100%  Weight: 157 lb 12.8 oz (71.6 kg)  Height: 5\' 5"  (1.651 m)   Physical Exam  Constitutional: She is oriented to person, place, and time. She appears well-developed and well-nourished. No distress.  HENT:  Head: Normocephalic and atraumatic.  Eyes: EOM are normal. Pupils are equal, round, and reactive to light.  Neck: Normal range of motion. Neck supple. No tracheal deviation present.  Cardiovascular: Normal rate, regular rhythm and intact distal pulses.   No murmur heard. Pulmonary/Chest: Effort normal and breath sounds normal. No respiratory distress. She has no wheezes. She has no rales.  Abdominal: Soft. Bowel sounds are normal. She exhibits no distension. There is no tenderness.  Musculoskeletal: Right great toe is mildly edematous. Bunion noticed on the medial aspect of the same toe.  Normal range of motion. No erythema or increased warmth of the joint.  Neurological: She is alert and oriented to person, place, and time.  Skin: Skin is warm and dry.   Assessment & Plan:   See Encounters Tab for problem based charting.   Patient seen with Dr. Evette Doffing

## 2015-09-29 NOTE — Assessment & Plan Note (Signed)
Assessment Patient has risk factors for diabetes including age, being overweight (BMI 26), African American race, and family history. She is concerned about having diabetes also denies having any symptoms such as polyuria, polyphagia, or polydipsia. A1c checked during this visit is 5.8  Plan Lifestyle modifications: Emphasized importance of diet and weight loss Recheck A1c in 6 months

## 2015-09-29 NOTE — Assessment & Plan Note (Addendum)
Assessment Patient states she is following up with Dr. Evangeline Gula (OB/GYN) currently taking estradiol every day. No complaints.  Plan Encouraged her to go to her next appointment and take medications as instructed by her OB/GYN

## 2015-09-29 NOTE — Assessment & Plan Note (Addendum)
Assessment Presenting with pain and swelling of her right great toe. No injury to the area but patient does stand on her feet for long periods of time and wears boots at work. Bunion noticed on the medial aspect of the same toe.  Does report standing on her feet for long periods of hours at work and wears boots. Toe is mildly edematous but no erythema or increased warmth on exam. Pt does not consume alcohol or red meats. No other joint pain/swelling or rash. Her toe pain and swelling are likely from the bunion 2/2 to wearing tight fitting shoes. Gout less likely as symptoms are gradual in onset.  Plan Advise to wear comfortable shoes Ibuprofen for pain/ inflammation Advised to contact the clinic if her toe pain/swelling do not improve within 5-7 days

## 2015-09-29 NOTE — Assessment & Plan Note (Signed)
>>  ASSESSMENT AND PLAN FOR ENCOUNTER FOR SCREENING FOR HIV WRITTEN ON 09/29/2015  5:06 PM BY RATHORE, VASUNDHRA, MD  Assessment Patient states she wants to be tested for HIV because her same-sex marriage partner has HIV. As per patient, her partner's viral load is undetectable and she follows up with infectious disease regularly. HIV antibody ordered during this visit undetectable.  Plan Check for HIV bi-annualy

## 2015-09-29 NOTE — Assessment & Plan Note (Signed)
Assessment Patient states she wants to be tested for HIV because her same-sex marriage partner has HIV. As per patient, her partner's viral load is undetectable and she follows up with infectious disease regularly. HIV antibody ordered during this visit undetectable.  Plan Check for HIV bi-annualy

## 2015-09-29 NOTE — Assessment & Plan Note (Addendum)
Assessment I spent a dedicated 4 minutes discussing smoking cessation. Patient states she does not have a desire to quit at this time.  Plan Ask again at next visit

## 2015-09-30 NOTE — Progress Notes (Signed)
Internal Medicine Clinic Attending  I saw and evaluated the patient.  I personally confirmed the key portions of the history and exam documented by Dr. Marlowe Sax and I reviewed pertinent patient test results.  The assessment, diagnosis, and plan were formulated together and I agree with the documentation in the resident's note.  Great toe has mild hallux valgus deformity, no erythema or synovitis. I doubt crystal disease given the prolonged time course and lack of inflammatory exam findings. Plan is for conservative therapy and return to clinic if pain or functioning worsens.

## 2015-10-04 ENCOUNTER — Ambulatory Visit
Admission: RE | Admit: 2015-10-04 | Discharge: 2015-10-04 | Disposition: A | Discharge status: Home / Self Care | Payer: No Typology Code available for payment source | Source: Ambulatory Visit | Attending: Obstetrics & Gynecology | Admitting: Obstetrics & Gynecology

## 2015-10-04 DIAGNOSIS — Z1239 Encounter for other screening for malignant neoplasm of breast: Secondary | ICD-10-CM

## 2015-10-07 ENCOUNTER — Ambulatory Visit: Payer: No Typology Code available for payment source

## 2016-01-04 ENCOUNTER — Encounter: Payer: Self-pay | Admitting: Obstetrics & Gynecology

## 2016-01-04 ENCOUNTER — Ambulatory Visit (INDEPENDENT_AMBULATORY_CARE_PROVIDER_SITE_OTHER): Payer: BLUE CROSS/BLUE SHIELD | Admitting: Obstetrics & Gynecology

## 2016-01-04 VITALS — BP 119/89 | HR 96 | Wt 155.2 lb

## 2016-01-04 DIAGNOSIS — N959 Unspecified menopausal and perimenopausal disorder: Secondary | ICD-10-CM | POA: Diagnosis not present

## 2016-01-04 DIAGNOSIS — Z716 Tobacco abuse counseling: Secondary | ICD-10-CM | POA: Diagnosis not present

## 2016-01-04 MED ORDER — ESTRADIOL 1 MG PO TABS: 1.0000 mg | ORAL_TABLET | Freq: Every day | ORAL | 4 refills | Status: DC

## 2016-01-04 MED ORDER — NICOTINE 21 MG/24HR TD PT24: 21.0000 mg | MEDICATED_PATCH | Freq: Every day | TRANSDERMAL | 0 refills | Status: DC

## 2016-01-04 MED ORDER — NICOTINE 14 MG/24HR TD PT24: 14.0000 mg | MEDICATED_PATCH | Freq: Every day | TRANSDERMAL | 0 refills | Status: DC

## 2016-01-04 MED ORDER — NICOTINE 7 MG/24HR TD PT24: 7.0000 mg | MEDICATED_PATCH | Freq: Every day | TRANSDERMAL | 0 refills | Status: DC

## 2016-01-04 NOTE — Progress Notes (Signed)
History:  55 y.o. No obstetric history on file. here today for eval of postmenopausal state and ERT. Pt reports that she she has fever hot flushes but, they still occur, mainly at night. She denies other sx.  She wants to continue the Estrace but, is not interested in switching to the patch.   She reports that she did not stop smoking tobacco but, is interested in stopping. She can't chew gum at work but, wants to consider the patch. She reports that she will not smoke while on the patch.     The following portions of the patient's history were reviewed and updated as appropriate: allergies, current medications, past family history, past medical history, past social history, past surgical history and problem list.  Review of Systems:  Pertinent items are noted in HPI.  Objective:  Physical Exam Last menstrual period 03/14/2015. CONSTITUTIONAL: Well-developed, well-nourished female in no acute distress.  HENT:  Normocephalic, atraumatic,  NEUROLGIC: Alert and oriented to person, place, and time. PSYCHIATRIC: Normal mood and affect. Normal behavior. Normal judgment and thought content. Pelvic exam: deferred  Labs and Imaging No results found.  Assessment & Plan:  Postmenopausal state- management of ERT Tobacco cessation - Nicoderm patch 21mg  daily or 28 days followed by 14mg  patch for 28 days followed by 7 mg patch for 28 days.   Pt instructed NOT to smoke AT ALL while the patch is on Refilled Estrace 1 mg po daily F/u in 1 year or sooner prn  Total face-to-face time with patient was 20 min.  Greater than 50% was spent in counseling and coordination of care with the patient. We discussed ERT and tobacco cessation    Wyvonne Carda L. Harraway-Smith, M.D., Cherlynn June

## 2016-01-04 NOTE — Addendum Note (Signed)
Addended by: Lavonia Drafts on: 01/04/2016 04:36 PM   Modules accepted: Orders

## 2016-01-04 NOTE — Patient Instructions (Signed)
Tobacco Use Disorder Tobacco use disorder (TUD) is a mental disorder. It is the long-term use of tobacco in spite of related health problems or difficulty with normal life activities. Tobacco is most commonly smoked as cigarettes and less commonly as cigars or pipes. Smokeless chewing tobacco and snuff are also popular. People with TUD get a feeling of extreme pleasure (euphoria) from using tobacco and have a desire to use it again and again. Repeated use of tobacco can cause problems. The addictive effects of tobacco are due mainly tothe ingredient nicotine. Nicotine also causes a rush of adrenaline (epinephrine) in the body. This leads to increased blood pressure, heart rate, and breathing rate. These changes may cause problems for people with high blood pressure, weak hearts, or lung disease. High doses of nicotine in children and pets can lead to seizures and death.  Tobacco contains a number of other unsafe chemicals. These chemicals are especially harmful when inhaled as smoke and can damage almost every organ in the body. Smokers live shorter lives than nonsmokers and are at risk of dying from a number of diseases and cancers. Tobacco smoke can also cause health problems for nonsmokers (due to inhaling secondhand smoke). Smoking is also a fire hazard.  TUD usually starts in the late teenage years and is most common in young adults between the ages of 18 and 25 years. People who start smoking earlier in life are more likely to continue smoking as adults. TUD is somewhat more common in men than women. People with TUD are at higher risk for using alcohol and other drugs of abuse. RISK FACTORS Risk factors for TUD include:   Having family members with the disorder.  Being around people who use tobacco.  Having an existing mental health issue such as schizophrenia, depression, bipolar disorder, ADHD, or posttraumatic stress disorder (PTSD). SIGNS AND SYMPTOMS  People with tobacco use disorder have  two or more of the following signs and symptoms within 12 months:   Use of more tobacco over a longer period than intended.   Not able to cut down or control tobacco use.   A lot of time spent obtaining or using tobacco.   Strong desire or urge to use tobacco (craving). Cravings may last for 6 months or longer after quitting.  Use of tobacco even when use leads to major problems at work, school, or home.   Use of tobacco even when use leads to relationship problems.   Giving up or cutting down on important life activities because of tobacco use.   Repeatedly using tobacco in situations where it puts you or others in physical danger, like smoking in bed.   Use of tobacco even when it is known that a physical or mental problem is likely related to tobacco use.   Physical problems are numerous and may include chronic bronchitis, emphysema, lung and other cancers, gum disease, high blood pressure, heart disease, and stroke.   Mental problems caused by tobacco may include difficulty sleeping and anxiety.  Need to use greater amounts of tobacco to get the same effect. This means you have developed a tolerance.   Withdrawal symptoms as a result of stopping or rapidly cutting back use. These symptoms may last a month or more after quitting and include the following:   Depressed, anxious, or irritable mood.   Difficulty concentrating.   Increased appetite.  Restlessness or trouble sleeping.   Use of tobacco to avoid withdrawal symptoms. DIAGNOSIS  Tobacco use disorder is diagnosed by   your health care provider. A diagnosis may be made by:  Your health care provider asking questions about your tobacco use and any problems it may be causing.  A physical exam.  Lab tests.  You may be referred to a mental health professional or addiction specialist. The severity of tobacco use disorder depends on the number of signs and symptoms you have:   Mild--Two or three  symptoms.  Moderate--Four or five symptoms.   Severe--Six or more symptoms.  TREATMENT  Many people with tobacco use disorder are unable to quit on their own and need help. Treatment options include the following:  Nicotine replacement therapy (NRT). NRT provides nicotine without the other harmful chemicals in tobacco. NRT gradually lowers the dosage of nicotine in the body and reduces withdrawal symptoms. NRT is available in over-the-counter forms (gum, lozenges, and skin patches) as well as prescription forms (mouth inhaler and nasal spray).  Medicines.This may include:  Antidepressant medicine that may reduce nicotine cravings.  A medicine that acts on nicotine receptors in the brain to reduce cravings and withdrawal symptoms. It may also block the effects of tobacco in people with TUD who relapse.  Counseling or talk therapy. A form of talk therapy called behavioral therapy is commonly used to treat people with TUD. Behavioral therapy looks at triggers for tobacco use, how to avoid them, and how to cope with cravings. It is most effective in person or by phone but is also available in self-help forms (books and Internet websites).  Support groups. These provide emotional support, advice, and guidance for quitting tobacco. The most effective treatment for TUD is usually a combination of medicine, talk therapy, and support groups. HOME CARE INSTRUCTIONS  Keep all follow-up visits as directed by your health care provider. This is important.  Take medicines only as directed by your health care provider.  Check with your health care provider before starting new prescription or over-the-counter medicines. SEEK MEDICAL CARE IF:  You are not able to take your medicines as prescribed.  Treatment is not helping your TUD and your symptoms get worse. SEEK IMMEDIATE MEDICAL CARE IF:  You have serious thoughts about hurting yourself or others.  You have trouble breathing, chest pain,  sudden weakness, or sudden numbness in part of your body.   This information is not intended to replace advice given to you by your health care provider. Make sure you discuss any questions you have with your health care provider.   Document Released: 10/26/2003 Document Revised: 03/12/2014 Document Reviewed: 04/17/2013 Elsevier Interactive Patient Education 2016 Elsevier Inc.  

## 2016-02-03 ENCOUNTER — Telehealth: Payer: Self-pay | Admitting: General Practice

## 2016-02-03 NOTE — Telephone Encounter (Signed)
Patient called and left message stating she is a patient of Dr Ihor Dow and she prescribed her nicotine patches. Patient states she is having a bad allergic reaction and wants to know if she can have the prescription changed to nicotine gum.

## 2016-02-09 DIAGNOSIS — M79641 Pain in right hand: Secondary | ICD-10-CM | POA: Insufficient documentation

## 2016-02-09 DIAGNOSIS — M79644 Pain in right finger(s): Secondary | ICD-10-CM | POA: Insufficient documentation

## 2016-02-13 NOTE — Telephone Encounter (Signed)
See below

## 2016-02-16 ENCOUNTER — Other Ambulatory Visit: Payer: Self-pay | Admitting: Obstetrics & Gynecology

## 2016-02-16 DIAGNOSIS — Z720 Tobacco use: Secondary | ICD-10-CM

## 2016-02-16 MED ORDER — NICOTINE POLACRILEX 4 MG MT GUM: 4.0000 mg | CHEWING_GUM | OROMUCOSAL | 0 refills | Status: DC | PRN

## 2016-02-16 NOTE — Progress Notes (Signed)
I spoke with patient she will try and purchase gum over the counter. If not she will call us back.

## 2016-03-09 ENCOUNTER — Ambulatory Visit (HOSPITAL_COMMUNITY)
Admission: EM | Admit: 2016-03-09 | Discharge: 2016-03-09 | Disposition: A | Discharge status: Home / Self Care | Payer: BLUE CROSS/BLUE SHIELD | Attending: Family Medicine | Admitting: Family Medicine

## 2016-03-09 ENCOUNTER — Encounter (HOSPITAL_COMMUNITY): Payer: Self-pay | Admitting: Emergency Medicine

## 2016-03-09 DIAGNOSIS — B354 Tinea corporis: Secondary | ICD-10-CM

## 2016-03-09 MED ORDER — KETOCONAZOLE 2 % EX CREA: 1.0000 "application " | TOPICAL_CREAM | Freq: Two times a day (BID) | CUTANEOUS | 0 refills | Status: AC

## 2016-03-09 NOTE — ED Triage Notes (Signed)
Here for rash on back and right arm onset 1 week  Reports "godson" slept with her on the same bed and is being treated for ring worm  Denies fevers  A&O x4... NAD

## 2016-03-09 NOTE — ED Provider Notes (Signed)
CSN: [id number]     Arrival date & time 03/09/16  1903 History   First MD Initiated Contact with Patient 03/09/16 2033     Chief Complaint  Patient presents with  . Rash   (Consider location/radiation/quality/duration/timing/severity/associated sxs/prior Treatment) 56 year old female presents to clinic with chief complaint of a rash on her lower back and her right arm. States that she has had the rash for 1 week. States that the rash is itching and scaling. She reports her godson has been staying with her and he is currently has ring worm and believes she has the same condition.   The history is provided by the patient.  Rash    Past Medical History:  Diagnosis Date  . Asthma   . History of umbilical hernia   . Renal stones    Past Surgical History:  Procedure Laterality Date  . lithotrypsy    . UMBILICAL HERNIA REPAIR  1990   Family History  Problem Relation Age of Onset  . Cancer Mother   . Diabetes Father   . Colon cancer Neg Hx    Social History  Substance Use Topics  . Smoking status: Current Every Day Smoker    Packs/day: 1.00    Years: 39.00    Types: Cigarettes  . Smokeless tobacco: Never Used     Comment: Has set a day.  . 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   Respiratory: Negative.   Cardiovascular: Negative.   Skin: Positive for color change and rash.    Allergies  Patient has no known allergies.  Home Medications   Prior to Admission medications   Medication Sig Start Date End Date Taking? Authorizing Provider  estradiol (ESTRACE) 1 MG tablet Take 1 tablet (1 mg total) by mouth daily. 01/04/16  Yes Lavonia Drafts, MD  nicotine (NICODERM CQ - DOSED IN MG/24 HR) 7 mg/24hr patch Place 1 patch (7 mg total) onto the skin daily. 03/02/16  Yes Lavonia Drafts, MD  albuterol (PROVENTIL HFA;VENTOLIN  HFA) 108 (90 BASE) MCG/ACT inhaler Inhale 2 puffs into the lungs every 6 (six) hours as needed for wheezing or shortness of breath. Patient not taking: Reported on 03/09/2016 06/28/14   Gregor Hams, MD  Ascorbic Acid (VITAMIN C PO) Take 1 tablet by mouth daily. Reported on 06/13/2015    Historical Provider, MD  docusate sodium (COLACE) 100 MG capsule Take 1 capsule (100 mg total) by mouth 2 (two) times daily. Patient not taking: Reported on 03/09/2016 04/28/15   Lavonia Drafts, MD  HYDROcodone-acetaminophen (NORCO/VICODIN) 5-325 MG tablet Take 1-2 tablets by mouth every 4 (four) hours as needed for moderate pain or severe pain. Patient not taking: Reported on 03/09/2016 04/28/15   Lavonia Drafts, MD  ibuprofen (ADVIL,MOTRIN) 600 MG tablet Take 1 tablet (600 mg total) by mouth every 6 (six) hours as needed (mild pain). Patient not taking: Reported on 03/09/2016 09/27/15   Shela Leff, MD  ketoconazole (NIZORAL) 2 % cream Apply 1 application topically 2 (two) times daily. 03/09/16 04/08/16  Barnet Glasgow, NP  nicotine (NICODERM CQ - DOSED IN MG/24 HOURS) 14 mg/24hr patch Place 1 patch (14 mg total) onto the skin daily. 02/03/16   Lavonia Drafts, MD  nicotine (NICODERM CQ - DOSED IN MG/24 HOURS) 21 mg/24hr patch Place 1 patch (21 mg total)  onto the skin daily. 01/04/16   Lavonia Drafts, MD  nicotine polacrilex (NICORETTE) 4 MG gum Take 1 each (4 mg total) by mouth as needed for smoking cessation. 02/16/16   Lavonia Drafts, MD  ondansetron (ZOFRAN) 4 MG tablet Take 1 tablet (4 mg total) by mouth every 6 (six) hours. Prn n/v. Patient not taking: Reported on 03/09/2016 08/03/15   Billy Fischer, MD   Meds Ordered and Administered this Visit  Medications - No data to display  BP 125/85 (BP Location: Left Arm)   Pulse 100   Temp 98.5 F (36.9 C) (Oral)   Resp 20   LMP 03/14/2015 (Approximate)   SpO2 100%  No data found.   Physical Exam  Constitutional: She is oriented  to person, place, and time. She appears well-developed and well-nourished. No distress.  Cardiovascular: Normal rate and regular rhythm.   Pulmonary/Chest: Effort normal and breath sounds normal.  Abdominal: Bowel sounds are normal.  Neurological: She is alert and oriented to person, place, and time.  Skin: Skin is warm and dry. Capillary refill takes less than 2 seconds. Rash noted. She is not diaphoretic.     Nursing note and vitals reviewed.   Urgent Care Course   Clinical Course     Procedures (including critical care time)  Labs Review Labs Reviewed - No data to display  Imaging Review No results found.   Visual Acuity Review  Right Eye Distance:   Left Eye Distance:   Bilateral Distance:    Right Eye Near:   Left Eye Near:    Bilateral Near:         MDM   1. Tinea corporis    Patient given ketoconazole topical ointment for fungal infection of the skin. Patient advised to keep the area clean, apply the cream twice daily for 2-4 weeks. Should the rash fail to improve or worsen follow up with her PCP or return to clinic.    Barnet Glasgow, NP 03/09/16 2110

## 2016-03-09 NOTE — Discharge Instructions (Signed)
Apply the cream to the affected areas twice daily for 2-4 weeks. Should the symptoms fail to improve or worsen follow up with your primary care provider or return to clinic

## 2016-05-16 ENCOUNTER — Telehealth: Payer: Self-pay | Admitting: Internal Medicine

## 2016-05-16 NOTE — Telephone Encounter (Signed)
APT. REMINDER CALL, LMTCB °

## 2016-05-17 ENCOUNTER — Ambulatory Visit (INDEPENDENT_AMBULATORY_CARE_PROVIDER_SITE_OTHER): Payer: BLUE CROSS/BLUE SHIELD | Admitting: Internal Medicine

## 2016-05-17 VITALS — BP 134/81 | HR 103 | Temp 97.9°F | Wt 172.0 lb

## 2016-05-17 DIAGNOSIS — Z0000 Encounter for general adult medical examination without abnormal findings: Secondary | ICD-10-CM

## 2016-05-17 DIAGNOSIS — Z8739 Personal history of other diseases of the musculoskeletal system and connective tissue: Secondary | ICD-10-CM | POA: Diagnosis not present

## 2016-05-17 DIAGNOSIS — Z90710 Acquired absence of both cervix and uterus: Secondary | ICD-10-CM | POA: Diagnosis not present

## 2016-05-17 DIAGNOSIS — Z716 Tobacco abuse counseling: Secondary | ICD-10-CM

## 2016-05-17 DIAGNOSIS — F17210 Nicotine dependence, cigarettes, uncomplicated: Secondary | ICD-10-CM | POA: Diagnosis not present

## 2016-05-17 MED ORDER — NICOTINE POLACRILEX 4 MG MT GUM: 4.0000 mg | CHEWING_GUM | OROMUCOSAL | 2 refills | Status: DC | PRN

## 2016-05-17 NOTE — Patient Instructions (Addendum)
Christy Burke,  It was a pleasure to meet you today. I'm glad you are doing well. I have sent a prescription for nicorette gum to your pharmacy. Please continue to work on smoking cessating and weight loss. Please follow up in 6 months or soon if you need me. If you have any questions or concerns, call our clinic at [phone] or after hours call [phone] and ask for the internal medicine resident on call. Thank you!   - Dr. Philipp Ovens

## 2016-05-17 NOTE — Assessment & Plan Note (Signed)
Patient is here today to establish care with a new PCP. She expressed concern for her weight as she is up 17 lbs since her last visit 4 months ago. She reports a poor diet. We discussed setting healthy eating habits and trying to cut back on unhealthy soft drinks and snack food. I warned patient about the possibility of increased appetite when she quits smoking and encouraged her to use the gum as needed to avoid cravings. As far as preventative health screenings, patient is a 56 yo F s/p total hysterectomy (cervix removed) for uterine fibroids and does not require routine PAP smears. She had a normal screening mammogram in August of 2017, plan for repeat screening in 2 years. She had a normal colonoscopy in November of 2014, recommend repeat in 10 years.  -- Follow up 6 months

## 2016-05-17 NOTE — Assessment & Plan Note (Signed)
Patient is here for smoking cessation advise. She is currently smoking 1.5 PPD and expresses a desire to quit. She has tried nicotine patches in the past but they irritate her skin. She is requesting a prescription for nicorette gum. Her wife just recently quit smoking and the gum worked well for her. She would like to give this a try before any oral medications. She is resistant to the idea of Chantix.  -- Nicorette gum  -- Encouraged cessation

## 2016-05-17 NOTE — Progress Notes (Addendum)
   CC: Establish care with new PCP  HPI:  [name] is a 56 y.o. F with past medical history outlined below here to establish care. For the details of today's visit please refer to the assessment and plan.  Past Medical History:  Diagnosis Date  . Asthma   . History of umbilical hernia   . Renal stones     Review of Systems:  All pertinents listed in HPI, otherwise negative  Physical Exam:  Vitals:   05/17/16 1615  BP: 134/81  Pulse: (!) 103  Temp: 97.9 F (36.6 C)  TempSrc: Oral  SpO2: 100%  Weight: 172 lb (78 kg)    Constitutional: NAD, appears comfortable Cardiovascular: RRR, no murmurs, rubs, or gallops.  Pulmonary/Chest: CTAB, no wheezes, rales, or rhonchi.  Abdominal: Soft, non tender, non distended. +BS. Multiple well healed abdominal incisions.  Extremities: Warm and well perfused. No edema.  Neurological: A&Ox3, CN II - XII grossly intact.    Assessment & Plan:   See Encounters Tab for problem based charting.  Patient discussed with Dr. Dareen Burke

## 2016-05-23 NOTE — Progress Notes (Signed)
Internal Medicine Clinic Attending  Case discussed with Dr. Guilloud at the time of the visit.  We reviewed the resident's history and exam and pertinent patient test results.  I agree with the assessment, diagnosis, and plan of care documented in the resident's note.  

## 2016-07-26 DIAGNOSIS — M1811 Unilateral primary osteoarthritis of first carpometacarpal joint, right hand: Secondary | ICD-10-CM | POA: Insufficient documentation

## 2016-08-24 ENCOUNTER — Other Ambulatory Visit: Payer: Self-pay | Admitting: Obstetrics & Gynecology

## 2016-08-24 DIAGNOSIS — Z1231 Encounter for screening mammogram for malignant neoplasm of breast: Secondary | ICD-10-CM

## 2016-09-20 ENCOUNTER — Ambulatory Visit: Payer: BLUE CROSS/BLUE SHIELD

## 2016-09-21 ENCOUNTER — Ambulatory Visit (INDEPENDENT_AMBULATORY_CARE_PROVIDER_SITE_OTHER): Payer: BLUE CROSS/BLUE SHIELD | Admitting: *Deleted

## 2016-09-21 DIAGNOSIS — Z111 Encounter for screening for respiratory tuberculosis: Secondary | ICD-10-CM | POA: Diagnosis not present

## 2016-09-21 DIAGNOSIS — Z0000 Encounter for general adult medical examination without abnormal findings: Secondary | ICD-10-CM

## 2016-09-24 ENCOUNTER — Telehealth: Payer: Self-pay | Admitting: *Deleted

## 2016-09-24 LAB — TB SKIN TEST: TB SKIN TEST: POSITIVE

## 2016-09-24 NOTE — Telephone Encounter (Signed)
Pt presented for TB skin test reading today, placed fri 7/20, area raised, red , dr Evette Doffing evaluated and measured Pt will return 7/24 at 0830 for [facility] visit

## 2016-09-24 NOTE — Progress Notes (Signed)
Pt presents for reading of TB skin test placed Friday 7/20, area raised at site, red. Dr Evette Doffing evaluated, pt will return to clinic tues 7/24 at [facility]

## 2016-09-24 NOTE — Telephone Encounter (Signed)
Right arm area of tuberculin skin test has induration measuring 13 x 16 cm. 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 I advised that we would need to do a full visit to discuss risk factors, obtain a chest x-ray to rule out active tuberculosis, and discuss treatment of latent tuberculosis.

## 2016-09-25 ENCOUNTER — Ambulatory Visit (HOSPITAL_COMMUNITY)
Admission: RE | Admit: 2016-09-25 | Discharge: 2016-09-25 | Disposition: A | Discharge status: Home / Self Care | Payer: BLUE CROSS/BLUE SHIELD | Source: Ambulatory Visit | Attending: Internal Medicine | Admitting: Internal Medicine

## 2016-09-25 ENCOUNTER — Ambulatory Visit (INDEPENDENT_AMBULATORY_CARE_PROVIDER_SITE_OTHER): Payer: BLUE CROSS/BLUE SHIELD | Admitting: Internal Medicine

## 2016-09-25 VITALS — BP 125/92 | HR 82 | Temp 98.5°F | Resp 20 | Ht 67.0 in | Wt 179.5 lb

## 2016-09-25 DIAGNOSIS — Z716 Tobacco abuse counseling: Secondary | ICD-10-CM | POA: Diagnosis not present

## 2016-09-25 DIAGNOSIS — R7611 Nonspecific reaction to tuberculin skin test without active tuberculosis: Secondary | ICD-10-CM

## 2016-09-25 DIAGNOSIS — F17210 Nicotine dependence, cigarettes, uncomplicated: Secondary | ICD-10-CM

## 2016-09-25 DIAGNOSIS — Z8615 Personal history of latent tuberculosis infection: Secondary | ICD-10-CM | POA: Insufficient documentation

## 2016-09-25 MED ORDER — NICOTINE POLACRILEX 4 MG MT GUM: 4.0000 mg | CHEWING_GUM | OROMUCOSAL | 2 refills | Status: DC | PRN

## 2016-09-25 NOTE — Assessment & Plan Note (Signed)
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 She denies any sx concerning for active infection, and has no known TB exposure. We discussed need for CXR to evaluate for active disease, and discussed treatment options including duration of therapy, side effects. We will fax her results to the health department for further management.   -CXR -Health Department f/u

## 2016-09-25 NOTE — Progress Notes (Signed)
   CC: Positve ppd  HPI:  Christy Burke is a 56 y.o. F with past medical history as detailed below who presents to the clinic for f/u of a positive PPD skin test.   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 She denies any known exposure to TB, has not worked in healthcare in the past, no recent travel, no prison exposure. She denies cough, hemoptysis, weight loss, night sweats, or fever.   She is also working on smoking cessation and has decreased from 1.5 ppd to 1 ppd. She would like to continue working to quit and is requesting a refill of nicotine gum to help with cessation. She previously tried patches but felt they irritated her skin.   Past Medical History:  Diagnosis Date  . Asthma   . History of umbilical hernia   . Renal stones    Review of Systems:  Review of Systems  Constitutional: Negative for fever and weight loss.  Respiratory: Negative for cough and hemoptysis.      Physical Exam:  Vitals:   09/25/16 0821  BP: (!) 125/92  Pulse: 82  Resp: 20  Temp: 98.5 F (36.9 C)  TempSrc: Oral  SpO2: 98%  Weight: 179 lb 8 oz (81.4 kg)  Height: 5\' 7"  (1.702 m)   Physical Exam  Constitutional: She is oriented to person, place, and time. She appears well-developed and well-nourished.  Pulmonary/Chest: Effort normal. No respiratory distress.  Neurological: She is alert and oriented to person, place, and time.  Skin: Skin is warm and dry.  Erythema and induration at site of ppd injection    Assessment & Plan:   See Encounters Tab for problem based charting.  Patient seen with Dr. Lynnae January

## 2016-09-25 NOTE — Patient Instructions (Signed)
Thank you for coming in to day Christy Burke.   We put in for you to have the chest x-ray if you're able to go up to the 1st floor at radiology to have that done. We will send your information to the health department so they can make an appointment for you and discuss treatment options and other details. The health department number is [phone] and ask for the TB control nurse. We also sent nicotine gum to your pharmacy and requested the flavor kind.

## 2016-09-25 NOTE — Assessment & Plan Note (Signed)
Pt has gradually cut back, currently at 1 ppd. Nicotine gum was previously prescribed though she reports she is currently using her partner's. Refill provided for nicotine gum and continued to encourage smoking cessation.

## 2016-09-26 ENCOUNTER — Telehealth: Payer: Self-pay | Admitting: *Deleted

## 2016-09-26 NOTE — Telephone Encounter (Signed)
-----   Message from Tawny Asal, MD sent at 09/25/2016  1:09 PM EDT ----- If possible, could we fax a copy of this report, the documentation of her ppd test, and a facesheet with demographic information to the Health Department for TB follow up. Fax is [phone], attn: Tammy

## 2016-09-26 NOTE — Progress Notes (Signed)
Internal Medicine Clinic Attending  I saw and evaluated the patient.  I personally confirmed the key portions of the history and exam documented by Dr. Harden and I reviewed pertinent patient test results.  The assessment, diagnosis, and plan were formulated together and I agree with the documentation in the resident's note.  

## 2016-09-26 NOTE — Telephone Encounter (Signed)
Pt's information ( CXR, Demographics and PPD )faxed to [facility] Attn: Tammy per Dr Marita Snellen request.

## 2016-10-04 ENCOUNTER — Ambulatory Visit
Admission: RE | Admit: 2016-10-04 | Discharge: 2016-10-04 | Disposition: A | Discharge status: Home / Self Care | Payer: BLUE CROSS/BLUE SHIELD | Source: Ambulatory Visit | Attending: Obstetrics & Gynecology | Admitting: Obstetrics & Gynecology

## 2016-10-04 DIAGNOSIS — Z1231 Encounter for screening mammogram for malignant neoplasm of breast: Secondary | ICD-10-CM

## 2016-10-11 ENCOUNTER — Ambulatory Visit: Payer: BLUE CROSS/BLUE SHIELD | Admitting: Obstetrics & Gynecology

## 2016-10-15 ENCOUNTER — Encounter: Payer: Self-pay | Admitting: Internal Medicine

## 2016-10-15 ENCOUNTER — Ambulatory Visit (INDEPENDENT_AMBULATORY_CARE_PROVIDER_SITE_OTHER): Payer: BLUE CROSS/BLUE SHIELD | Admitting: Internal Medicine

## 2016-10-15 VITALS — BP 128/82 | HR 103 | Temp 98.9°F | Ht 67.0 in | Wt 178.8 lb

## 2016-10-15 DIAGNOSIS — Z716 Tobacco abuse counseling: Secondary | ICD-10-CM | POA: Diagnosis not present

## 2016-10-15 DIAGNOSIS — R7611 Nonspecific reaction to tuberculin skin test without active tuberculosis: Secondary | ICD-10-CM

## 2016-10-15 DIAGNOSIS — F17210 Nicotine dependence, cigarettes, uncomplicated: Secondary | ICD-10-CM | POA: Diagnosis not present

## 2016-10-15 MED ORDER — NICOTINE POLACRILEX 4 MG MT GUM: 4.0000 mg | CHEWING_GUM | OROMUCOSAL | 2 refills | Status: DC | PRN

## 2016-10-15 NOTE — Progress Notes (Signed)
   CC: Follow up of positive PPD, smoking cessation   HPI:  Ms.Bibiana Virella is a 56 y.o. female with past medical history outlined below here for follow up of positive PPD and smoking cessation. For the details of today's visit, please refer to the assessment and plan.  Past Medical History:  Diagnosis Date  . Asthma   . History of umbilical hernia   . Renal stones     Review of Systems:  Denies cough, chest pain, SOB   Physical Exam:  Vitals:   10/15/16 1603  BP: 128/82  Pulse: (!) 103  Temp: 98.9 F (37.2 C)  TempSrc: Oral  SpO2: 100%  Weight: 178 lb 12.8 oz (81.1 kg)  Height: 5\' 7"  (1.702 m)    Constitutional: NAD, appears comfortable Cardiovascular: RRR  Pulmonary/Chest: CTAB Extremities: Warm and well perfused. No edema.  Skin: No rashes or erythema  Psychiatric: Normal mood and affect  Assessment & Plan:   See Encounters Tab for problem based charting.  Patient discussed with Dr. Angelia Mould

## 2016-10-15 NOTE — Patient Instructions (Signed)
[name],  It was a pleasure seeing you today. Please continue to take your medication as previously prescribed. I have sent a prescription for nicotine gum to your pharmacy. Please continue to cut back on smoking. Follow up with me in 3 months or sooner if you need me. If you have any questions or concerns, call our clinic at [phone] or after hours call [phone] and ask for the internal medicine resident on call. Thank you!  - Dr. Philipp Ovens

## 2016-10-17 NOTE — Assessment & Plan Note (Signed)
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 She was seen 7/24 for skin check and noted to have erythema and induration measuring 13 x 16 cm at the injection site. Follow up CXR was negative for active disease. She was referred to the health department for treatment of latent TB. She has been started on Rifampin 600 mg daily and denies any side effects.  -- Continue Rifampin 600 mg daily x 4 months  -- Follow up health department

## 2016-10-17 NOTE — Assessment & Plan Note (Signed)
Patient continues to smoke, 1/2 PPD. She is actively trying to cut back and has been using nicotine gum as needed. Cessation encouraged, refills provided for nicotine gum.

## 2016-10-18 ENCOUNTER — Ambulatory Visit: Payer: BLUE CROSS/BLUE SHIELD | Admitting: Obstetrics & Gynecology

## 2016-10-18 NOTE — Progress Notes (Signed)
Internal Medicine Clinic Attending  Case discussed with Dr. Guilloud at the time of the visit.  We reviewed the resident's history and exam and pertinent patient test results.  I agree with the assessment, diagnosis, and plan of care documented in the resident's note.  

## 2016-11-22 ENCOUNTER — Encounter: Payer: Self-pay | Admitting: Obstetrics & Gynecology

## 2016-11-22 ENCOUNTER — Ambulatory Visit (INDEPENDENT_AMBULATORY_CARE_PROVIDER_SITE_OTHER): Payer: BLUE CROSS/BLUE SHIELD | Admitting: Obstetrics & Gynecology

## 2016-11-22 VITALS — BP 120/90 | HR 90 | Ht 65.0 in | Wt 180.0 lb

## 2016-11-22 DIAGNOSIS — Z01419 Encounter for gynecological examination (general) (routine) without abnormal findings: Secondary | ICD-10-CM | POA: Diagnosis not present

## 2016-11-22 DIAGNOSIS — N959 Unspecified menopausal and perimenopausal disorder: Secondary | ICD-10-CM

## 2016-11-22 DIAGNOSIS — Z720 Tobacco use: Secondary | ICD-10-CM

## 2016-11-22 MED ORDER — NICOTINE 21 MG/24HR TD PT24: 21.0000 mg | MEDICATED_PATCH | Freq: Every day | TRANSDERMAL | 1 refills | Status: DC

## 2016-11-22 MED ORDER — ESTRADIOL 1 MG PO TABS: 1.0000 mg | ORAL_TABLET | Freq: Every day | ORAL | 4 refills | Status: DC

## 2016-11-22 NOTE — Progress Notes (Signed)
Subjective:     Christy Burke is a 56 y.o. female here for a routine exam.  Current complaints: pt here for ERT management. She reports hot flushes but, thinks it got worse with the meds she is taking for a +PPD. She does not want to adjust her meds at present.        Gynecologic History Patient's last menstrual period was 03/14/2015 (approximate). Contraception: n/a Last Pap: 03/2015. Results were: normal Last mammogram: 10/04/2016. Results were: normal  Obstetric History OB History  No data available   The following portions of the patient's history were reviewed and updated as appropriate: allergies, current medications, past family history, past medical history, past social history, past surgical history and problem list.  Review of Systems Pertinent items are noted in HPI.    Objective:   BP 120/90   Pulse 90   Ht 5\' 5"  (1.651 m)   Wt 180 lb (81.6 kg)   LMP 03/14/2015 (Approximate)   BMI 29.95 kg/m  General Appearance:    Alert, cooperative, no distress, appears stated age  Head:    Normocephalic, without obvious abnormality, atraumatic  Eyes:    conjunctiva/corneas clear, EOM's intact, both eyes  Ears:    Normal external ear canals, both ears  Nose:   Nares normal, septum midline, mucosa normal, no drainage    or sinus tenderness  Throat:   Lips, mucosa, and tongue normal; teeth and gums normal  Neck:   Supple, symmetrical, trachea midline, no adenopathy;    thyroid:  no enlargement/tenderness/nodules  Back:     Symmetric, no curvature, ROM normal, no CVA tenderness  Lungs:     Clear to auscultation bilaterally, respirations unlabored  Chest Wall:    No tenderness or deformity   Heart:    Regular rate and rhythm, S1 and S2 normal, no murmur, rub   or gallop  Breast Exam:    No tenderness, masses, or nipple abnormality  Abdomen:     Soft, non-tender, bowel sounds active all four quadrants,    no masses, no organomegaly  Genitalia:    Normal female without lesion,  discharge or tenderness     Extremities:   Extremities normal, atraumatic, no cyanosis or edema  Pulses:   2+ and symmetric all extremities  Skin:   Skin color, texture, turgor normal, no rashes or lesions     Assessment:    Healthy female exam.   ERT management - will keep at the same dosage.     Plan:    Follow up in: 1 year.   f/u sooner prn Refilled Estrace 1mg  1 po q day  Ralpheal Zappone L. Harraway-Smith, M.D., Cherlynn June

## 2016-11-26 ENCOUNTER — Encounter: Payer: BLUE CROSS/BLUE SHIELD | Admitting: Internal Medicine

## 2017-01-14 ENCOUNTER — Encounter: Payer: BLUE CROSS/BLUE SHIELD | Admitting: Internal Medicine

## 2017-01-14 ENCOUNTER — Encounter: Payer: Self-pay | Admitting: Internal Medicine

## 2017-04-02 ENCOUNTER — Encounter (HOSPITAL_COMMUNITY): Payer: Self-pay | Admitting: Emergency Medicine

## 2017-04-02 ENCOUNTER — Ambulatory Visit (HOSPITAL_COMMUNITY)
Admission: EM | Admit: 2017-04-02 | Discharge: 2017-04-02 | Disposition: A | Discharge status: Home / Self Care | Payer: BLUE CROSS/BLUE SHIELD | Attending: Family Medicine | Admitting: Family Medicine

## 2017-04-02 DIAGNOSIS — Z9889 Other specified postprocedural states: Secondary | ICD-10-CM | POA: Insufficient documentation

## 2017-04-02 DIAGNOSIS — Z809 Family history of malignant neoplasm, unspecified: Secondary | ICD-10-CM | POA: Diagnosis not present

## 2017-04-02 DIAGNOSIS — Z833 Family history of diabetes mellitus: Secondary | ICD-10-CM | POA: Diagnosis not present

## 2017-04-02 DIAGNOSIS — R221 Localized swelling, mass and lump, neck: Secondary | ICD-10-CM | POA: Diagnosis not present

## 2017-04-02 DIAGNOSIS — J45909 Unspecified asthma, uncomplicated: Secondary | ICD-10-CM | POA: Insufficient documentation

## 2017-04-02 DIAGNOSIS — R7611 Nonspecific reaction to tuberculin skin test without active tuberculosis: Secondary | ICD-10-CM | POA: Insufficient documentation

## 2017-04-02 DIAGNOSIS — G8929 Other chronic pain: Secondary | ICD-10-CM | POA: Diagnosis not present

## 2017-04-02 DIAGNOSIS — Z791 Long term (current) use of non-steroidal anti-inflammatories (NSAID): Secondary | ICD-10-CM | POA: Diagnosis not present

## 2017-04-02 DIAGNOSIS — Z79899 Other long term (current) drug therapy: Secondary | ICD-10-CM | POA: Diagnosis not present

## 2017-04-02 DIAGNOSIS — F17210 Nicotine dependence, cigarettes, uncomplicated: Secondary | ICD-10-CM | POA: Diagnosis not present

## 2017-04-02 DIAGNOSIS — R7303 Prediabetes: Secondary | ICD-10-CM | POA: Insufficient documentation

## 2017-04-02 DIAGNOSIS — E049 Nontoxic goiter, unspecified: Secondary | ICD-10-CM | POA: Insufficient documentation

## 2017-04-02 DIAGNOSIS — Z87442 Personal history of urinary calculi: Secondary | ICD-10-CM | POA: Insufficient documentation

## 2017-04-02 LAB — T4, FREE: Free T4: 2.34 ng/dL — ABNORMAL HIGH (ref 0.61–1.12)

## 2017-04-02 LAB — TSH

## 2017-04-02 NOTE — ED Provider Notes (Signed)
[facility]    CSN: [id number] Arrival date & time: 04/02/17  0957     History   Chief Complaint Chief Complaint  Patient presents with  . Oral Swelling    HPI Christy Burke is a 57 y.o. female.   With history of asthma, noticed yesterday that her neck is swollen. She denies recent illness. She denies sore throat. She denies history of thyroid disorders. Reports that she stays irritable but no constipation, cold intolerance, hair loss or recent weight loss. Afebrile today.       Past Medical History:  Diagnosis Date  . Asthma   . History of umbilical hernia   . Renal stones     Patient Active Problem List   Diagnosis Date Noted  . 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 Great toe pain 09/29/2015  . Encounter for screening for HIV 09/29/2015  . Pre-diabetes 09/29/2015  . Status post TAH-BSO 09/21/2015  . Chronic knee pain 12/23/2014  . Tendonitis of wrist, left 12/11/2014  . Stress at home 10/20/2014  . Preventative health care 09/07/2014  . Back pain 06/04/2013  . Encounter for smoking cessation counseling 05/23/2012    Past Surgical History:  Procedure Laterality Date  . lithotrypsy    . UMBILICAL HERNIA REPAIR  1990    OB History    No data available       Home Medications    Prior to Admission medications   Medication Sig Start Date End Date Taking? Authorizing Provider  albuterol (PROVENTIL HFA;VENTOLIN HFA) 108 (90 BASE) MCG/ACT inhaler Inhale 2 puffs into the lungs every 6 (six) hours as needed for wheezing or shortness of breath. 06/28/14  Yes Gregor Hams, MD  Ascorbic Acid (VITAMIN C PO) Take 1 tablet by mouth daily. Reported on 06/13/2015   Yes [provider]  estradiol (ESTRACE) 1 MG tablet Take 1 tablet (1 mg total) by mouth daily. 11/22/16  Yes Lavonia Drafts, MD  ibuprofen (ADVIL,MOTRIN) 600 MG tablet Take 1 tablet (600 mg total) by mouth every 6 (six) hours as needed (mild pain). 09/27/15   Shela Leff, MD    nicotine (NICODERM CQ - DOSED IN MG/24 HOURS) 21 mg/24hr patch Place 1 patch (21 mg total) onto the skin daily. 11/22/16   Lavonia Drafts, MD  rifampin (RIFADIN) 300 MG capsule Take 600 mg by mouth daily.    [provider]    Family History Family History  Problem Relation Age of Onset  . Cancer Mother   . Diabetes Father   . Colon cancer Neg Hx     Social History Social History   Tobacco Use  . Smoking status: Current Every Day Smoker    Packs/day: 1.00    Years: 39.00    Pack years: 39.00    Types: Cigarettes  . Smokeless tobacco: Never Used  . Tobacco comment: wants Nicotine gum  Substance Use Topics  . 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 Drug use: No    Comment: Quit several years ago, never did IVDU only smoked cocaine     Allergies   Patient has no known allergies.   Review of Systems Review of Systems  Constitutional:       See HPI  Respiratory: Negative for cough and shortness of breath.   Cardiovascular: Negative for chest pain and palpitations.     Physical Exam Triage Vital Signs ED Triage Vitals  Enc Vitals Group     BP 04/02/17 1009 127/85     Pulse Rate 04/02/17 1009 77     Resp 04/02/17 1009 18     Temp 04/02/17 1009 97.9 F (36.6 C)     Temp Source 04/02/17 1009 Oral     SpO2 04/02/17 1009 97 %     Weight 04/02/17 1012 190 lb (86.2 kg)     Height 04/02/17 1012 5\' 6"  (1.676 m)     Head Circumference --      Peak Flow --      Pain Score 04/02/17 1012 0     Pain Loc --      Pain Edu? --      Excl. in [place]? --    No data found.  Updated Vital Signs BP 127/85 (BP Location: Left Arm)   Pulse 77   Temp 97.9 F (36.6 C) (Oral)   Resp 18   Ht 5\' 6"  (1.676 m)   Wt 190 lb (86.2 kg)   LMP 03/14/2015 (Approximate)   SpO2 97%   BMI 30.67 kg/m   Physical Exam  Constitutional: She is oriented to person, place, and time. She appears  well-developed and well-nourished.  HENT:  Head: Normocephalic and atraumatic.  Mouth/Throat: Oropharynx is clear and moist. No oropharyngeal exudate.  Neck:  Goiter noted  Cardiovascular: Normal rate, regular rhythm and normal heart sounds.  Pulmonary/Chest: Effort normal and breath sounds normal. She has no wheezes.  Neurological: She is alert and oriented to person, place, and time.  Skin: Skin is warm and dry.  Nursing note and vitals reviewed.    UC Treatments / Results  Labs (all labs ordered are listed, but only abnormal results are displayed) Labs Reviewed  TSH  T4, FREE    EKG  EKG Interpretation None      Radiology No results found.  Procedures Procedures (including critical care time)  Medications Ordered in UC Medications - No data to display   Initial Impression / Assessment and Plan / UC Course  I have reviewed the triage vital signs and the nursing notes.  Pertinent labs & imaging results that were available during my care of the patient were reviewed by me and considered in my medical decision making (see chart for details).   Final Clinical Impressions(s) / UC Diagnoses   Final diagnoses:  Goiter   Patient noted to have a goiter today.  Patient is asymptomatic.  We will check a TSH and free T4, PCP have access to Epic and would be able to view these labs.  Advised the patient to make an appointment with family doctor for an  Ultrasound.  Patient denies any questions.  ED Discharge Orders    None     Controlled Substance Prescriptions  Controlled Substance Registry consulted? Not Applicable   Barry Dienes, NP 04/02/17 1029

## 2017-04-02 NOTE — Discharge Instructions (Signed)
You have a goiter today.  will check your thyroid labs today. You would need to make an appointment with your family doctor to schedule for a thyroid Ultrasound.

## 2017-04-02 NOTE — ED Triage Notes (Signed)
PT reports she noticed yesterday that her throat appears to be swollen. PT denies airway changes. PT denies sore throat.

## 2017-04-15 ENCOUNTER — Ambulatory Visit (INDEPENDENT_AMBULATORY_CARE_PROVIDER_SITE_OTHER): Payer: BLUE CROSS/BLUE SHIELD | Admitting: Internal Medicine

## 2017-04-15 ENCOUNTER — Other Ambulatory Visit: Payer: Self-pay

## 2017-04-15 ENCOUNTER — Encounter: Payer: Self-pay | Admitting: Internal Medicine

## 2017-04-15 VITALS — BP 131/86 | HR 98 | Temp 98.0°F | Ht 67.0 in | Wt 171.2 lb

## 2017-04-15 DIAGNOSIS — F17210 Nicotine dependence, cigarettes, uncomplicated: Secondary | ICD-10-CM

## 2017-04-15 DIAGNOSIS — E0500 Thyrotoxicosis with diffuse goiter without thyrotoxic crisis or storm: Secondary | ICD-10-CM | POA: Diagnosis not present

## 2017-04-15 DIAGNOSIS — E049 Nontoxic goiter, unspecified: Secondary | ICD-10-CM

## 2017-04-15 DIAGNOSIS — Z114 Encounter for screening for human immunodeficiency virus [HIV]: Secondary | ICD-10-CM

## 2017-04-15 MED ORDER — PROPRANOLOL HCL 10 MG PO TABS: 10.0000 mg | ORAL_TABLET | Freq: Two times a day (BID) | ORAL | 0 refills | Status: DC

## 2017-04-15 NOTE — Progress Notes (Signed)
   CC: Goiter follow up   HPI:  [name] is a 57 y.o. female with PMH listed below who presents to clinic with goiter x 2 weeks. Please see problem based assessment and plan for further details.   Past Medical History:  Diagnosis Date  . Asthma   . History of umbilical hernia   . Renal stones    Review of Systems:   Review of Systems  Constitutional: Positive for malaise/fatigue. Negative for chills, diaphoresis, fever and weight loss.  Eyes: Negative for blurred vision and double vision.  Respiratory: Negative for shortness of breath.   Cardiovascular: Positive for palpitations. Negative for chest pain.  Gastrointestinal: Negative for abdominal pain, constipation, diarrhea, nausea and vomiting.  Musculoskeletal: Negative for myalgias.  Neurological: Negative for dizziness, tremors, weakness and headaches.  Psychiatric/Behavioral: The patient is not nervous/anxious and does not have insomnia.     Physical Exam:  Vitals:   04/15/17 0900  BP: 131/86  Pulse: 98  Temp: 98 F (36.7 C)  TempSrc: Oral  SpO2: 100%  Weight: 171 lb 3.2 oz (77.7 kg)  Height: 5\' 7"  (1.702 m)   General: pleasant female, well-nourished, well-developed, in no acute distress  HENT: NCAT, neck supple and FROM, OP clear without exudates or erythema. There is diffuse, painless goiter. No nodularity noted.  Eyes: anicteric sclera, PERRL, EOMI, no lid lad or proptosis noted  Cardiac: tachycardic and regular rhythm, nl S1/S2, no murmurs, rubs or gallops  Pulm: CTAB, no wheezes or crackles, no increased work of breathing  Abd: soft, NTND, bowel sounds present  Neuro: A&Ox3, no focal deficits noted  Ext: warm and well perfused, no peripheral edema   Assessment & Plan:   See Encounters Tab for problem based charting.  Patient seen with Dr. Rebeca Alert

## 2017-04-15 NOTE — Patient Instructions (Signed)
Christy Burke,   Your heart rate was elevated today which is likely because of an overactive thyroid. For this you will start taking propranolol 10 mg 2 times a day.   I will call you in a few days with the results of your blood tests.   Please call us if you have any questions.

## 2017-04-15 NOTE — Assessment & Plan Note (Addendum)
Patient recently seen at Urgent Care 1/29 and found to have TSH < 0.01 and FT4 2.34. She presents today for follow up of abnormal lab results. She noticed neck swelling 2 weeks ago while at work. Reports irritability and intermittent palpitations that she believes is associated with smoking. Denies anxiety, dysphagia, weakness, heat intolerance, increased perspiration, weight changes, and changes in bowel movements. She reports flu-like symptoms 2 months ago that have since resolved, but no recent illness. No history of thyroid disease in the family. VSS. On exam there is diffuse, painless goiter, no nodularity noted. She is tachycardic without murmurs, rubs or gallops. No lid lag or proptosis noted. DTRs 2+. DDx includes: Grave's disease, toxic adenoma, toxic multinodular goiter. Unlikely thyroiditis associated with recent illness (flu-like symptoms 2 months ago). Med list reviewed, no medications that could potentially cause this. Will repeat TSH and FT4 and order TPO antibodies and RAIU scan to further assess.   - Repeat  TSH and FT4 - Thyrotropin receptor antibody  - RAIU scan  - Propanolol 10 mg BID for tachycardia  - Follow up in 4 weeks

## 2017-04-16 LAB — TSH: TSH: <0.006 u[IU]/mL — ABNORMAL LOW (ref 0.450–4.500)

## 2017-04-16 LAB — HIV ANTIBODY (ROUTINE TESTING W REFLEX): HIV Screen 4th Generation wRfx: NONREACTIVE

## 2017-04-16 LAB — THYROTROPIN RECEPTOR AUTOABS: Thyrotropin Receptor Ab: 12.16 IU/L — ABNORMAL HIGH (ref 0.00–1.75)

## 2017-04-16 LAB — T4, FREE: Free T4: 3.04 ng/dL — ABNORMAL HIGH (ref 0.82–1.77)

## 2017-04-16 NOTE — Addendum Note (Signed)
Addended by: Marcelino Duster on: 04/16/2017 10:50 AM   Modules accepted: Orders

## 2017-04-16 NOTE — Progress Notes (Signed)
Internal Medicine Clinic Attending  I saw and evaluated the patient.  I personally confirmed the key portions of the history and exam documented by Dr. Isac Sarna and I reviewed pertinent patient test results.  The assessment, diagnosis, and plan were formulated together and I agree with the documentation in the resident's note.  Christy Burke presents with a prominent goiter and undetectable TSH found at urgent care, FT4 elevated, consistent with hyperthyroidism. Confirmatory testing in our office showed ongoing hyperthyroidism and positive TRAb titers, confirming Graves disease. No palpable nodules on exam, no need for [place] or RAIU scan for further evaluation. She has very mild symptoms with borderline tachycardia, mild intermittent tremor, but not other symptoms. No proptosis and normal conjunctivae on exam. As indicated by Dr. Isac Sarna, will start propranolol and methimazole, follow closely for symptoms and follow up TSH, FT4, and T3 to adjust dosage.   Oda Kilts, MD

## 2017-04-18 ENCOUNTER — Telehealth: Payer: Self-pay | Admitting: *Deleted

## 2017-04-18 NOTE — Telephone Encounter (Signed)
Have tried all ph#'s for pt, no answer at any

## 2017-05-27 ENCOUNTER — Ambulatory Visit: Payer: BLUE CROSS/BLUE SHIELD | Admitting: Internal Medicine

## 2017-05-27 ENCOUNTER — Other Ambulatory Visit: Payer: Self-pay

## 2017-05-27 ENCOUNTER — Encounter: Payer: Self-pay | Admitting: Internal Medicine

## 2017-05-27 VITALS — BP 124/79 | HR 88 | Temp 98.5°F | Ht 67.0 in | Wt 171.5 lb

## 2017-05-27 DIAGNOSIS — E0500 Thyrotoxicosis with diffuse goiter without thyrotoxic crisis or storm: Secondary | ICD-10-CM | POA: Diagnosis not present

## 2017-05-27 DIAGNOSIS — E049 Nontoxic goiter, unspecified: Secondary | ICD-10-CM

## 2017-05-27 DIAGNOSIS — Z79899 Other long term (current) drug therapy: Secondary | ICD-10-CM

## 2017-05-27 MED ORDER — PROPRANOLOL HCL 10 MG PO TABS: 10.0000 mg | ORAL_TABLET | Freq: Two times a day (BID) | ORAL | 2 refills | Status: DC

## 2017-05-27 MED ORDER — METHIMAZOLE 10 MG PO TABS: 10.0000 mg | ORAL_TABLET | Freq: Three times a day (TID) | ORAL | 2 refills | Status: DC

## 2017-05-27 NOTE — Progress Notes (Signed)
   CC: Hyperthyroidism f/u  HPI:  [name] is a 57 y.o. female with past medical history outlined below here for hyperthyroidism follow up. For the details of today's visit, please refer to the assessment and plan.  Past Medical History:  Diagnosis Date  . Asthma   . History of umbilical hernia   . Renal stones     Review of Systems  Constitutional: Positive for diaphoresis and weight loss.  Cardiovascular: Positive for palpitations.    Physical Exam:  Vitals:   05/27/17 1333  BP: 124/79  Pulse: 88  Temp: 98.5 F (36.9 C)  TempSrc: Oral  SpO2: 100%  Weight: 171 lb 8 oz (77.8 kg)  Height: 5\' 7"  (1.702 m)    Constitutional: NAD, appears comfortable HEENT: Atraumatic, normocephalic. Neck: Supple, trachea midline. No lymphadenopathy.  Large goiter, no palpable nodules Cardiovascular: RRR, no murmurs, rubs, or gallops.  Pulmonary/Chest: CTAB, no wheezes, rales, or rhonchi.  Extremities: Warm and well perfused.  No edema.  Psychiatric: Normal mood and affect  Assessment & Plan:   See Encounters Tab for problem based charting.  Patient discussed with Dr. Dareen Piano

## 2017-05-27 NOTE — Patient Instructions (Addendum)
Christy Burke,  It was a pleasure to meet you. Please continue to take propanolol twice daily. I have sent refills to your pharmacy. I have also started you on another medicine called methimazole. Please take this three times a day. I have referred you to an endocrinologist for further management. You will be called to schedule this appointment. Please follow up with me again in 4-6 weeks or sooner if you have issues. If you have any questions or concerns, call our clinic at [phone] or after hours call [phone] and ask for the internal medicine resident on call. Thank you!  Dr. Philipp Ovens

## 2017-05-27 NOTE — Assessment & Plan Note (Signed)
Patient is here for hyperthyroidism follow up. She noticed a large lump in her neck a few months ago and went to an urgent care for evaluation. She was found to have a large goiter, low THS, and elevated free T4. She followed up in our clinic one month ago at which point TSH was persistently low and free T4 elevated. She was started on propanolol 10 mg BID for her symptoms and thyrotropin antibodies were collected that ultimately resulted positive. A RAIU scan was also ordered but not done. Today, patient reports she ran out of her propanolol one week ago. She endorses symptoms of weight loss (~8lbs per our records), palpitations, and jitteriness.   Plan: -- Refilled propanolol 10 mg BID -- Start methimazole 10 mg TID -- Endocrinology referral  -- F/u 4-6 weeks for repeat TSH and free T4

## 2017-05-29 NOTE — Progress Notes (Signed)
Internal Medicine Clinic Attending  Case discussed with Dr. Guilloud at the time of the visit.  We reviewed the resident's history and exam and pertinent patient test results.  I agree with the assessment, diagnosis, and plan of care documented in the resident's note.  

## 2017-06-28 ENCOUNTER — Other Ambulatory Visit: Payer: Self-pay

## 2017-06-28 ENCOUNTER — Encounter: Payer: Self-pay | Admitting: Internal Medicine

## 2017-06-28 ENCOUNTER — Ambulatory Visit: Payer: BLUE CROSS/BLUE SHIELD | Admitting: Internal Medicine

## 2017-06-28 VITALS — BP 125/85 | HR 81 | Temp 98.1°F | Ht 66.0 in | Wt 176.2 lb

## 2017-06-28 DIAGNOSIS — Z716 Tobacco abuse counseling: Secondary | ICD-10-CM

## 2017-06-28 DIAGNOSIS — E0500 Thyrotoxicosis with diffuse goiter without thyrotoxic crisis or storm: Secondary | ICD-10-CM | POA: Diagnosis not present

## 2017-06-28 DIAGNOSIS — F17200 Nicotine dependence, unspecified, uncomplicated: Secondary | ICD-10-CM | POA: Diagnosis not present

## 2017-06-28 DIAGNOSIS — H05839 Thyroid orbitopathy, unspecified orbit: Secondary | ICD-10-CM

## 2017-06-28 DIAGNOSIS — Z79899 Other long term (current) drug therapy: Secondary | ICD-10-CM | POA: Diagnosis not present

## 2017-06-28 MED ORDER — NICOTINE POLACRILEX 4 MG MT GUM
4.0000 mg | CHEWING_GUM | OROMUCOSAL | 0 refills | Status: DC | PRN
Start: 2017-06-28 — End: 2017-07-08

## 2017-06-28 NOTE — Patient Instructions (Addendum)
FOLLOW-UP INSTRUCTIONS When: Please rescheduled next PCP appt. for after 07/26/17. For: Graves Dz follow up What to bring: Medications   Christy Burke,  It was a pleasure to see you today. I am glad you are doing well. Please continue to take your medicine as previously prescribed. Please keep your appointment with endocrinology next month. Follow up me after your appointment with them. I will get your FMLA paperwork filled out for your job.   If you have any questions or concerns, call our clinic at [phone] or after hours call [phone] and ask for the internal medicine resident on call. Thank you!  - Dr. Philipp Ovens

## 2017-06-28 NOTE — Assessment & Plan Note (Signed)
New since initiating methimazole treatment.  She currently denies any visual changes.  Instructed patient to call or present to the ED if she develops vision loss.  Stressed the importance of smoking cessation.  Continue methimazole, follow-up endocrinology.

## 2017-06-28 NOTE — Progress Notes (Signed)
   CC: Graves Disease f/u  HPI:  [name] is a 57 y.o. female with past medical history outlined below here for Graves disease follow up. For the details of today's visit, please refer to the assessment and plan.  Past Medical History:  Diagnosis Date  . Asthma   . History of umbilical hernia   . Renal stones     Review of Systems  Cardiovascular: Negative for chest pain and palpitations.    Physical Exam:  Vitals:   06/28/17 1558  BP: 125/85  Pulse: 81  Temp: 98.1 F (36.7 C)  TempSrc: Oral  SpO2: 100%  Weight: 176 lb 3.2 oz (79.9 kg)  Height: 5\' 6"  (1.676 m)    Constitutional: NAD, appears comfortable HEENT: Atraumatic, normocephalic. Left exophthalmos. Neck: Supple, trachea midline. No lymphadenopathy.  Large goiter, no palpable nodules Cardiovascular: RRR, no murmurs, rubs, or gallops.  Pulmonary/Chest: CTAB, no wheezes, rales, or rhonchi.  Extremities: Warm and well perfused.  No edema.  Psychiatric: Normal mood and affect      Assessment & Plan:   See Encounters Tab for problem based charting.  Patient discussed with Dr. Eppie Gibson

## 2017-06-28 NOTE — Assessment & Plan Note (Signed)
Patient is here for follow-up of her Graves' disease.  She was recently diagnosed 2 months ago after presentation to the ED for goiter evaluation.  She has had abnormal thyroid labs consistent with hyperthyroidism checked on 2 separate occasions.  Her thyrotropin antibodies resulted positive.  She was started on propanolol and methimazole 1 month ago.  At that time she was symptomatic with palpitations and jitteriness.  She reports her symptoms have improved, although occasionally experiences hot flashes (she is status post total hysterectomy and oophorectomy). She has a persistent goiter today on exam but denies symptoms of dysphagia or difficulty breathing. She is normotensive with a pulse of 81.  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  She denies any changes in her vision.  She has endocrinology appointment scheduled next month.  We will continue with current management for now.  I have educated patient on symptoms of sight threatening orbitopathy.  Instructed patient to call immediately or present to the ED if she develops vision loss.  Smoking cessation advised.  Sent prescription for Nicorette gum to pharmacy. --Continue propanolol 10 mg twice daily - Continue methimazole 10 mg 3 times daily --Endocrinology appointment scheduled next month - Follow-up after endocrinology evaluation

## 2017-06-28 NOTE — Assessment & Plan Note (Signed)
Discussed the importance of smoking cessation in the setting of her Graves' ophthalmopathy.  She is agreeable to quit.  I have sent a prescription for Nicorette gum to her pharmacy.

## 2017-07-01 NOTE — Progress Notes (Signed)
Case discussed with Dr. Guilloud at the time of the visit. We reviewed the resident's history and exam and pertinent patient test results. I agree with the assessment, diagnosis, and plan of care documented in the resident's note. 

## 2017-07-08 ENCOUNTER — Encounter: Payer: BLUE CROSS/BLUE SHIELD | Admitting: Internal Medicine

## 2017-07-08 ENCOUNTER — Ambulatory Visit: Payer: BLUE CROSS/BLUE SHIELD | Admitting: Obstetrics & Gynecology

## 2017-07-08 ENCOUNTER — Encounter: Payer: Self-pay | Admitting: Obstetrics & Gynecology

## 2017-07-08 VITALS — BP 130/97 | HR 66 | Ht 64.0 in | Wt 173.7 lb

## 2017-07-08 DIAGNOSIS — N959 Unspecified menopausal and perimenopausal disorder: Secondary | ICD-10-CM

## 2017-07-08 DIAGNOSIS — E0590 Thyrotoxicosis, unspecified without thyrotoxic crisis or storm: Secondary | ICD-10-CM

## 2017-07-08 DIAGNOSIS — N951 Menopausal and female climacteric states: Secondary | ICD-10-CM | POA: Diagnosis not present

## 2017-07-08 MED ORDER — ESTRADIOL 1 MG PO TABS: 1.0000 mg | ORAL_TABLET | Freq: Every day | ORAL | 4 refills | Status: DC

## 2017-07-08 NOTE — Patient Instructions (Signed)
Hyperthyroidism Hyperthyroidism is when the thyroid is too active (overactive). Your thyroid is a large gland that is located in your neck. The thyroid helps to control how your body uses food (metabolism). When your thyroid is overactive, it produces too much of a hormone called thyroxine. What are the causes? Causes of hyperthyroidism may include:  Graves disease. This is when your immune system attacks the thyroid gland. This is the most common cause.  Inflammation of the thyroid gland.  Tumor in the thyroid gland or somewhere else.  Excessive use of thyroid medicines, including: ? Prescription thyroid supplement. ? Herbal supplements that mimic thyroid hormones.  Solid or fluid-filled lumps within your thyroid gland (thyroid nodules).  Excessive ingestion of iodine.  What increases the risk?  Being female.  Having a family history of thyroid conditions. What are the signs or symptoms? Signs and symptoms of hyperthyroidism may include:  Nervousness.  Inability to tolerate heat.  Unexplained weight loss.  Diarrhea.  Change in the texture of hair or skin.  Heart skipping beats or making extra beats.  Rapid heart rate.  Loss of menstruation.  Shaky hands.  Fatigue.  Restlessness.  Increased appetite.  Sleep problems.  Enlarged thyroid gland or nodules.  How is this diagnosed? Diagnosis of hyperthyroidism may include:  Medical history and physical exam.  Blood tests.  Ultrasound tests.  How is this treated? Treatment may include:  Medicines to control your thyroid.  Surgery to remove your thyroid.  Radiation therapy.  Follow these instructions at home:  Take medicines only as directed by your health care provider.  Do not use any tobacco products, including cigarettes, chewing tobacco, or electronic cigarettes. If you need help quitting, ask your health care provider.  Do not exercise or do physical activity until your health care provider  approves.  Keep all follow-up appointments as directed by your health care provider. This is important. Contact a health care provider if:  Your symptoms do not get better with treatment.  You have fever.  You are taking thyroid replacement medicine and you: ? Have depression. ? Feel mentally and physically slow. ? Have weight gain. Get help right away if:  You have decreased alertness or a change in your awareness.  You have abdominal pain.  You feel dizzy.  You have a rapid heartbeat.  You have an irregular heartbeat. This information is not intended to replace advice given to you by your health care provider. Make sure you discuss any questions you have with your health care provider. Document Released: 02/19/2005 Document Revised: 07/21/2015 Document Reviewed: 07/07/2013 Elsevier Interactive Patient Education  2018 [employer]  

## 2017-07-08 NOTE — Progress Notes (Signed)
History:  57 y.o. No obstetric history on file. here today for f/u of HRT and hot flushes. Pt reports that she recently began to have rapid HR, irritability and eye issues on the left side. She reports that her hot flushes became worse and she was loosing weight at a rapid rate. She was seen in the urgent care and dx'd with hyperthyroidism . She was seen by primary care dn placed on Methimazole and Inderal. She does feel better but, is worried because she was dx'd with 'a grave disease'.    The following portions of the patient's history were reviewed and updated as appropriate: allergies, current medications, past family history, past medical history, past social history, past surgical history and problem list.  Review of Systems:  Pertinent items are noted in HPI.    Objective:  Physical Exam Blood pressure (!) 130/97, pulse 66, height 5\' 4"  (1.626 m), weight 173 lb 11.2 oz (78.8 kg), last menstrual period 03/14/2015.  CONSTITUTIONAL: Well-developed, well-nourished female in no acute distress.  HENT:  Normocephalic, atraumatic. EYES: Conjunctivae and EOM are normal. No scleral icterus.  The left eye is protruding more than the right. This is obvious from prev visit.   NECK: Normal range of motion; the thyroid is enlarged.  SKIN: Skin is warm and dry. No rash noted. Not diaphoretic.No pallor. Sunflower: Alert and oriented to person, place, and time. Normal coordination.  Abd: Soft, nontender and nondistended Pelvic: not done   Assessment & Plan:  Hyperthyroidism- I reviewed pts sx and educated her on the name, "Graves disease". Pt was reassured by that info alone. I reviewed the relationship of hot flushes with hyperthyroid. I would not rec a change in her meds at pressent. We also reviewed possible long term tx options.    F/u with Endocrinology as scheduled F/u in 3 months or sooner prn Pt given education sheet on hyperthyroidism   .ch Total face-to-face time with patient was 20 min.   Greater than 50% was spent in counseling and coordination of care with the patient.

## 2017-07-26 ENCOUNTER — Encounter: Payer: Self-pay | Admitting: Internal Medicine

## 2017-07-26 ENCOUNTER — Ambulatory Visit: Payer: BLUE CROSS/BLUE SHIELD | Admitting: Internal Medicine

## 2017-07-26 VITALS — BP 116/70 | HR 63 | Ht 64.0 in | Wt 177.2 lb

## 2017-07-26 DIAGNOSIS — E0500 Thyrotoxicosis with diffuse goiter without thyrotoxic crisis or storm: Secondary | ICD-10-CM

## 2017-07-26 LAB — T3, FREE: T3, Free: 3.6 pg/mL (ref 2.3–4.2)

## 2017-07-26 LAB — T4, FREE: Free T4: 1.1 ng/dL (ref 0.60–1.60)

## 2017-07-26 LAB — TSH

## 2017-07-26 NOTE — Patient Instructions (Addendum)
Please stop at the lab.  Continue: -Methimazole 10 mg 3x a day -Propranolol 10 mg 2x a day  Please return in 3-4 months.   Hyperthyroidism Hyperthyroidism is when the thyroid is too active (overactive). Your thyroid is a large gland that is located in your neck. The thyroid helps to control how your body uses food (metabolism). When your thyroid is overactive, it produces too much of a hormone called thyroxine. What are the causes? Causes of hyperthyroidism may include:  Graves disease. This is when your immune system attacks the thyroid gland. This is the most common cause.  Inflammation of the thyroid gland.  Tumor in the thyroid gland or somewhere else.  Excessive use of thyroid medicines, including: ? Prescription thyroid supplement. ? Herbal supplements that mimic thyroid hormones.  Solid or fluid-filled lumps within your thyroid gland (thyroid nodules).  Excessive ingestion of iodine.  What increases the risk?  Being female.  Having a family history of thyroid conditions. What are the signs or symptoms? Signs and symptoms of hyperthyroidism may include:  Nervousness.  Inability to tolerate heat.  Unexplained weight loss.  Diarrhea.  Change in the texture of hair or skin.  Heart skipping beats or making extra beats.  Rapid heart rate.  Loss of menstruation.  Shaky hands.  Fatigue.  Restlessness.  Increased appetite.  Sleep problems.  Enlarged thyroid gland or nodules.  How is this diagnosed? Diagnosis of hyperthyroidism may include:  Medical history and physical exam.  Blood tests.  Ultrasound tests.  How is this treated? Treatment may include:  Medicines to control your thyroid.  Surgery to remove your thyroid.  Radiation therapy.  Follow these instructions at home:  Take medicines only as directed by your health care provider.  Do not use any tobacco products, including cigarettes, chewing tobacco, or electronic cigarettes.  If you need help quitting, ask your health care provider.  Do not exercise or do physical activity until your health care provider approves.  Keep all follow-up appointments as directed by your health care provider. This is important. Contact a health care provider if:  Your symptoms do not get better with treatment.  You have fever.  You are taking thyroid replacement medicine and you: ? Have depression. ? Feel mentally and physically slow. ? Have weight gain. Get help right away if:  You have decreased alertness or a change in your awareness.  You have abdominal pain.  You feel dizzy.  You have a rapid heartbeat.  You have an irregular heartbeat. This information is not intended to replace advice given to you by your health care provider. Make sure you discuss any questions you have with your health care provider. Document Released: 02/19/2005 Document Revised: 07/21/2015 Document Reviewed: 07/07/2013 Elsevier Interactive Patient Education  2018 [employer].

## 2017-07-26 NOTE — Progress Notes (Signed)
Patient ID: Christy Burke, female   DOB: 02/07/1961, 57 y.o.   MRN: [id number]    HPI  Christy Burke is a 57 y.o.-year-old female, referred by her PCP, Velna Ochs, MD, for recent diagnosis of Graves' disease.  Pt describes that in 03/2017 she noticed that her neck was enlarged >> scheduled an appt with PCP: was found to have thyrotoxicosis. Labs remained abnormal 2 weeks later.  At that time, she was started on: - Methimazole 10 mg 3x a day - Propranolol 10 mg 2x a day  Retrospectively, she did have: - hot flushes (but had a hysterectomy) - L eye bulging - some blurry vision, but not diplopia, chemosis, pain - weight loss: 20 lbs - in last 4 mo - lack of appetite - tremors - palpitations - better on beta blockers - no hyperdefecation - no insomnia - + anxiety  I reviewed pt's thyroid tests: Lab Results  Component Value Date   TSH <0.006 (L) 04/15/2017   TSH <0.010 (L) 04/02/2017   FREET4 3.04 (H) 04/15/2017   FREET4 2.34 (H) 04/02/2017   Thyrotropin receptor antibodies were elevated, indicative of Graves' disease: Component     Latest Ref Rng & Units 04/15/2017  Thyrotropin Receptor Ab     0.00 - 1.75 IU/L 12.16 (H)   No results found for: TSI  Pt denies feeling nodules in neck, hoarseness, dysphagia/odynophagia, SOB with lying down.  Pt does not have a FH of thyroid ds. No FH of thyroid cancer. No h/o radiation tx to head or neck.  No seaweed or kelp, no recent contrast studies. No steroid use. No herbal supplements. No Biotin use.  Pt. also has a history of kidney stones in 57, 2014, 2016, 2018. She had litotripsy. Some pain, not constant.  ROS: Constitutional: + see HPI Eyes: no blurry vision, no xerophthalmia ENT: no sore throat, + see HPI Cardiovascular: no CP/SOB/+ resolved palpitations/no leg swelling Respiratory: no cough/SOB Gastrointestinal: no N/V/D/C/+ acid reflux Musculoskeletal: no muscle/joint aches Skin: no rashes Neurological: + resolved  tremors/no numbness/tingling/dizziness Psychiatric: no depression/+ anxiety + Low libido  Past Medical History:  Diagnosis Date  . Asthma   . History of umbilical hernia   . Renal stones    Past Surgical History:  Procedure Laterality Date  . lithotrypsy    . UMBILICAL HERNIA REPAIR  1990   Social History   Socioeconomic History  . Marital status: Married    Spouse name: Not on file  . Number of children: 95: 30 year old in 2019  . Years of education: Not on file  . Highest education level: Not on file  Occupational History  .  Transport planner  Social Needs  . Financial resource strain: Not on file  . Food insecurity:    Worry: Not on file    Inability: Not on file  . Transportation needs:    Medical: Not on file    Non-medical: Not on file  Tobacco Use  . Smoking status: Current Every Day Smoker    Packs/day: 1.00    Years: 39.00    Pack years: 39.00    Types: Cigarettes  . Smokeless tobacco: Never Used  . Tobacco comment: wants Nicotine gum. cutting back  Substance and Sexual Activity  . 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 Drug use: No    Types: Cocaine    Comment: Quit several years ago, never did  IVDU only smoked cocaine  . Sexual activity: Yes    Birth control/protection: Other-see comments    Comment: In same sex relationship for 19 years   Current Outpatient Medications on File Prior to Visit  Medication Sig Dispense Refill  . albuterol (PROVENTIL HFA;VENTOLIN HFA) 108 (90 BASE) MCG/ACT inhaler Inhale 2 puffs into the lungs every 6 (six) hours as needed for wheezing or shortness of breath. (Patient not taking: Reported on 07/08/2017) 1 Inhaler 2  . Ascorbic Acid (VITAMIN C PO) Take 1 tablet by mouth daily. Reported on 06/13/2015    . estradiol (ESTRACE) 1 MG tablet Take 1 tablet (1 mg total) by mouth daily. 90 tablet 4  . ibuprofen (ADVIL,MOTRIN) 600 MG tablet Take 1  tablet (600 mg total) by mouth every 6 (six) hours as needed (mild pain). (Patient not taking: Reported on 07/08/2017) 20 tablet 0  . methimazole (TAPAZOLE) 10 MG tablet Take 1 tablet (10 mg total) by mouth 3 (three) times daily. 90 tablet 2  . propranolol (INDERAL) 10 MG tablet Take 1 tablet (10 mg total) by mouth 2 (two) times daily. 60 tablet 2   No current facility-administered medications on file prior to visit.    No Known Allergies Family History  Problem Relation Age of Onset  . Cancer Mother   . Diabetes Father   . Colon cancer Neg Hx    PE: BP 116/70   Pulse 63   Ht 5\' 4"  (1.626 m)   Wt 177 lb 3.2 oz (80.4 kg)   LMP 03/14/2015 (Approximate)   SpO2 98%   BMI 30.42 kg/m  Wt Readings from Last 3 Encounters:  07/26/17 177 lb 3.2 oz (80.4 kg)  07/08/17 173 lb 11.2 oz (78.8 kg)  06/28/17 176 lb 3.2 oz (79.9 kg)   Constitutional: slightly overweight, in NAD Eyes: PERRLA, EOMI, left eye appears slightly exophthalmic -however, per Hertel exophthalmometer, at 100.5 mm distance, both eyes protrude at 18: no lid lag, no stare;  ENT: moist mucous membranes, + symmetric thyromegaly, no thyroid bruits, no cervical lymphadenopathy Cardiovascular: RRR, No MRG Respiratory: CTA B Gastrointestinal: abdomen soft, NT, ND, BS+ Musculoskeletal: no deformities, strength intact in all 4 Skin: moist, warm, no rashes Neurological: no tremor with outstretched hands, DTR normal in all 4  ASSESSMENT: 1. Graves ds.   2.  Graves' ophthalmopathy  PLAN:  1. Patient with recent history of enlarged thyroid discovered by the patient when she was looking in the mirror in 03/2017.  Around that time, she was also told that  her left eye is mildly bulging.  She presented to the PCP who checked her thyroid test and they returned thyrotoxic.  The labs were repeated 2 weeks later and they remained abnormal.  Thyrotropin receptor antibodies were elevated, confirming Graves' disease.  She was started on  methimazole 10 mg 3 times a day and Inderal 10 mg twice a day.  Patient remembers that before starting on treatment she had weight loss, heat intolerance, palpitations, tremors, increased appetite, anxiety.  All of these are now resolved.  Her weight has increased approximately 6 pounds since starting treatment.  She does not complain of e increased fatigue. - At this visit, we discussed about her diagnosis of Graves' disease, explaining that this is an autoimmune disease in which antithyroid antibodies will stimulate the thyroid to put out more thyroid hormones then the body actually needs. - we discussed about possible modalities of treatment for the above conditions, to include methimazole use, radioactive iodine ablation  or (last resort) surgery. - At today's visit, we will check her TSH, free T4, free T3 and also we will add TSI antibodies, which are specific for Graves' disease - I expect that we can start to reduce both her methimazole and Inderal when the results return.  Her heart rate today 63, so I suspect that we can reduce Inderal but I would like to see the results of her TFTs first - If her TSI antibodies are elevated, we discussed about potentially using selenium which has been found to decrease TSI antibody titer - RTC in 3-4 months, but likely sooner for repeat labs  2.  Graves' ophthalmopathy - No ecchymosis, no diplopia, no pain with eye movement - Discussed that she will need to see her ophthalmologist again and let him know about her TSI titer  Office Visit on 07/26/2017  Component Date Value Ref Range Status  . TSH 07/26/2017 <0.01 Repeated and verified X2.* 0.35 - 4.50 uIU/mL Final  . Free T4 07/26/2017 1.10  0.60 - 1.60 ng/dL Final   Comment: Specimens from patients who are undergoing biotin therapy and /or ingesting biotin supplements may contain high levels of biotin.  The higher biotin concentration in these specimens interferes with this Free T4 assay.  Specimens that  contain high levels  of biotin may cause false high results for this Free T4 assay.  Please interpret results in light of the total clinical presentation of the patient.    . T3, Free 07/26/2017 3.6  2.3 - 4.2 pg/mL Final    Msg sent: Dear Ms Reece Packer, The results are much better! I am still waiting for the Graves Antibodies to come back. Will let you know as soon as they do. For now, we can decrease Methimazole 10 mg to 2x a day.  Continue the current Inderal dose. We will need a repeat of the tests in ~5 weeks. Please call our main office number [phone]) to schedule a lab appointment.  Sincerely, Philemon Kingdom MD  Component     Latest Ref Rng & Units 07/26/2017  TSI     <140 % baseline 319 (H)   TSI's elevated.  Philemon Kingdom, MD PhD [facility] Endocrinology

## 2017-07-30 ENCOUNTER — Other Ambulatory Visit: Payer: Self-pay | Admitting: Internal Medicine

## 2017-07-30 DIAGNOSIS — E049 Nontoxic goiter, unspecified: Secondary | ICD-10-CM

## 2017-07-30 DIAGNOSIS — E0500 Thyrotoxicosis with diffuse goiter without thyrotoxic crisis or storm: Secondary | ICD-10-CM

## 2017-07-30 MED ORDER — METHIMAZOLE 10 MG PO TABS
10.0000 mg | ORAL_TABLET | Freq: Two times a day (BID) | ORAL | 2 refills | Status: DC
Start: 2017-07-30 — End: 2017-08-29

## 2017-07-31 LAB — THYROID STIMULATING IMMUNOGLOBULIN: TSI: 319 %{baseline} — AB (ref <140)

## 2017-08-27 ENCOUNTER — Other Ambulatory Visit: Payer: Self-pay | Admitting: Internal Medicine

## 2017-08-27 DIAGNOSIS — E049 Nontoxic goiter, unspecified: Secondary | ICD-10-CM

## 2017-08-29 ENCOUNTER — Other Ambulatory Visit: Payer: Self-pay | Admitting: Internal Medicine

## 2017-08-29 MED ORDER — METHIMAZOLE 10 MG PO TABS: 10.0000 mg | ORAL_TABLET | Freq: Two times a day (BID) | ORAL | 1 refills | Status: DC

## 2017-08-29 NOTE — Telephone Encounter (Signed)
NEEDS REFILL METHIMAZOLE 10MG , Suzie Portela [phone]

## 2017-08-29 NOTE — Telephone Encounter (Addendum)
Propanolol # 60 with 5 refills sent 08/27/2017. Verified with [facility] that Rx is ready for pick up. Patient notified. Hubbard Hartshorn, RN, BSN

## 2017-08-29 NOTE — Telephone Encounter (Signed)
NEEDS REFILL PROPRANOL 10MG , TO Suzie Portela [phone]

## 2017-09-06 ENCOUNTER — Other Ambulatory Visit: Payer: Self-pay | Admitting: Internal Medicine

## 2017-09-07 ENCOUNTER — Other Ambulatory Visit: Payer: Self-pay | Admitting: Internal Medicine

## 2017-10-13 ENCOUNTER — Other Ambulatory Visit: Payer: Self-pay | Admitting: Internal Medicine

## 2017-11-25 ENCOUNTER — Ambulatory Visit (INDEPENDENT_AMBULATORY_CARE_PROVIDER_SITE_OTHER): Payer: BLUE CROSS/BLUE SHIELD | Admitting: Internal Medicine

## 2017-11-25 ENCOUNTER — Encounter: Payer: Self-pay | Admitting: Internal Medicine

## 2017-11-25 VITALS — BP 118/80 | HR 65 | Ht 64.0 in | Wt 184.0 lb

## 2017-11-25 DIAGNOSIS — E0500 Thyrotoxicosis with diffuse goiter without thyrotoxic crisis or storm: Secondary | ICD-10-CM | POA: Diagnosis not present

## 2017-11-25 LAB — T3, FREE: T3 FREE: 3 pg/mL (ref 2.3–4.2)

## 2017-11-25 LAB — TSH: TSH: 0.59 u[IU]/mL (ref 0.35–4.50)

## 2017-11-25 LAB — T4, FREE: FREE T4: 0.63 ng/dL (ref 0.60–1.60)

## 2017-11-25 NOTE — Progress Notes (Signed)
Patient ID: Christy Burke, female   DOB: 1960-07-07, 57 y.o.   MRN: [id number]    HPI  Christy Burke is a 57 y.o.-year-old female, returning for follow-up for Graves' disease and Graves' ophthalmopathy.  She saw her eye dr.  For her Graves' ophthalmopathy.  I reviewed the report but there is no comment on her evidence of exophthalmos.  He recommended another exam in a year.  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 Labs remained abnormal 2 weeks later.  At that time, she was started on: - Methimazole 10 mg 3x a day - Propranolol 10 mg 2x a day  Retrospectively, she did have: - hot flushes (but had a hysterectomy) - L eye bulging - some blurry vision, but not diplopia, chemosis, pain - weight loss: 20 lbs - in last 4 mo - lack of appetite - tremors - palpitations - better on beta blockers - no hyperdefecation - no insomnia - + anxiety  At last visit, her symptoms were resolved.  Also, her TFTs were much better, with normal free thyroid hormones and still suppressed TSH.  I advised her to decrease her methimazole dose to 10 mg twice a day at that time >> continues on 3x a day.  We continued the propranolol 10 mg twice a day.  She does mention a 7 pound weight gain, but otherwise, she feels well, without complaints.  Reviewed patient's TFTs: Lab Results  Component Value Date   TSH <0.01 Repeated and verified X2. (L) 07/26/2017   TSH <0.006 (L) 04/15/2017   TSH <0.010 (L) 04/02/2017   FREET4 1.10 07/26/2017   FREET4 3.04 (H) 04/15/2017   FREET4 2.34 (H) 04/02/2017   T3FREE 3.6 07/26/2017   Thyrotropin receptor antibodies and TSI's were elevated, indicative of Graves' disease:  Component     Latest Ref Rng & Units 04/15/2017  Thyrotropin Receptor Ab     0.00 - 1.75 IU/L 12.16 (H)   Lab Results  Component Value Date   TSI 319 (H) 07/26/2017   Pt denies: - feeling  nodules in neck - hoarseness - dysphagia - choking - SOB with lying down  Pt does not have a FH of thyroid ds.No FH of thyroid cancer. No h/o radiation tx to head or neck.  No seaweed or kelp. No recent contrast studies. No herbal supplements. No Biotin use. No recent steroids use.   Pt. also has a history of kidney stones in 2012, 2014, 2016, 2018.  She has a history of lithotripsy.  ROS: Constitutional: + Weight gain/no weight loss, no fatigue, + chronic subjective hyperthermia, no subjective hypothermia Eyes: no blurry vision, no xerophthalmia ENT: no sore throat, + see HPI Cardiovascular: no CP/no SOB/no palpitations/no leg swelling Respiratory: no cough/no SOB/no wheezing Gastrointestinal: no N/no V/no D/no C/no acid reflux Musculoskeletal: no muscle aches/no joint aches Skin: no rashes, no hair loss Neurological: no tremors/no numbness/no tingling/no dizziness  I reviewed pt's medications, allergies, PMH, social hx, family hx, and changes were documented in the history of present illness. Otherwise, unchanged from my initial visit note.  Past Medical History:  Diagnosis Date  . Asthma   . History of umbilical hernia   . Renal stones    Past Surgical History:  Procedure Laterality Date  . lithotrypsy    . UMBILICAL HERNIA REPAIR  1990   Social History   Socioeconomic History  . Marital status: Married  Spouse name: Not on file  . Number of children: 45: 20 year old in 2019  . Years of education: Not on file  . Highest education level: Not on file  Occupational History  .  Transport planner  Social Needs  . Financial resource strain: Not on file  . Food insecurity:    Worry: Not on file    Inability: Not on file  . Transportation needs:    Medical: Not on file    Non-medical: Not on file  Tobacco Use  . Smoking status: Current Every Day Smoker    Packs/day: 1.00    Years: 39.00    Pack years: 39.00    Types: Cigarettes  . Smokeless tobacco: Never Used   . Tobacco comment: wants Nicotine gum. cutting back  Substance and Sexual Activity  . 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 Drug use: No    Types: Cocaine    Comment: Quit several years ago, never did IVDU only smoked cocaine  . Sexual activity: Yes    Birth control/protection: Other-see comments    Comment: In same sex relationship for 19 years   Current Outpatient Medications on File Prior to Visit  Medication Sig Dispense Refill  . albuterol (PROVENTIL HFA;VENTOLIN HFA) 108 (90 BASE) MCG/ACT inhaler Inhale 2 puffs into the lungs every 6 (six) hours as needed for wheezing or shortness of breath. (Patient not taking: Reported on 07/08/2017) 1 Inhaler 2  . Ascorbic Acid (VITAMIN C PO) Take 1 tablet by mouth daily. Reported on 06/13/2015    . estradiol (ESTRACE) 1 MG tablet Take 1 tablet (1 mg total) by mouth daily. 90 tablet 4  . ibuprofen (ADVIL,MOTRIN) 600 MG tablet Take 1 tablet (600 mg total) by mouth every 6 (six) hours as needed (mild pain). (Patient not taking: Reported on 07/08/2017) 20 tablet 0  . methimazole (TAPAZOLE) 10 MG tablet TAKE 1 TABLET BY MOUTH TWICE DAILY 180 tablet 1  . propranolol (INDERAL) 10 MG tablet TAKE 1 TABLET BY MOUTH TWICE DAILY 60 tablet 5   No current facility-administered medications on file prior to visit.    No Known Allergies Family History  Problem Relation Age of Onset  . Cancer Mother   . Diabetes Father   . Colon cancer Neg Hx    PE: BP 118/80   Pulse 65   Ht 5\' 4"  (1.626 m)   Wt 184 lb (83.5 kg)   LMP 03/14/2015 (Approximate)   SpO2 97%   BMI 31.58 kg/m  Wt Readings from Last 3 Encounters:  11/25/17 184 lb (83.5 kg)  07/26/17 177 lb 3.2 oz (80.4 kg)  07/08/17 173 lb 11.2 oz (78.8 kg)   Constitutional: overweight, in NAD Eyes: PERRLA, EOMI, + left eye appears slightly exophthalmic, no lid lag, no stare ENT: moist mucous membranes, no thyromegaly, no  cervical lymphadenopathy Cardiovascular: RRR, No MRG Respiratory: CTA B Gastrointestinal: abdomen soft, NT, ND, BS+ Musculoskeletal: no deformities, strength intact in all 4 Skin: moist, warm, no rashes Neurological: no tremor with outstretched hands, DTR normal in all 4  ASSESSMENT: 1. Graves ds.   2.  Graves' ophthalmopathy  PLAN:  1. Patient with fairly recent history of enlarged thyroid discovered by the patient when she was looking the mirror 03/2017.  Around that time, she was also told that her left eye was mildly bulging.  She saw her PCP at that time she  was found to have thyrotoxicosis.  Labs remained abnormal 2 weeks later.  Thyrotropin receptor antibodies were elevated, confirming Graves' disease.  She was started on methimazole 10 mg 3 times a day and Inderal 10 mg twice a day.  Retrospectively, she did notice weight loss, heat intolerance, palpitations, tremors, increased appetite, anxiety.  All the symptoms have now resolved.  She continues to have some hot flashes, which are not new.  Her weight increased since she started treatment  - + 7 pounds since last visit.  She does not have increased fatigue.  After last visit, I sent her a message advising her to reduce the methimazole to 10 mg twice a day.  She did not remember to do this. -As of now, she continues on methimazole 10 mg 3x a day, Inderal 10 mg 2x a day -TSI antibodies still elevated when we checked it at last visit -Heart rate today is normal, even on the low side, so I will advised her to decrease Inderal to 10 mg daily.  We will plan to stop Inderal soon. -She did not see her ophthalmologist since last visit, which I advised her to do. -I will see her back in 4 months, but likely sooner for labs  2.  Graves' ophthalmopathy -No chemosis, no diplopia, no pain with eye movement -Her TSI antibodies were elevated at last check -Continue to follow-up with her ophthalmologist  Needs refills.  Component     Latest Ref  Rng & Units 11/25/2017  TSH     0.35 - 4.50 uIU/mL 0.59  T4,Free(Direct)     0.60 - 1.60 ng/dL 0.63  Triiodothyronine,Free,Serum     2.3 - 4.2 pg/mL 3.0   Thyroid tests are surprisingly normal.  We can try to decrease the methimazole dose to 10 mg twice a day and recheck her tests in 1 month.  Philemon Kingdom, MD PhD [facility] Endocrinology

## 2017-11-25 NOTE — Patient Instructions (Addendum)
Please decrease: - Propranolol 10 mg once a day  For now, continue: - Methimazole 10 mg 3x a day  Please stop at the lab.  Please return in 4 months.Christy Burke

## 2017-11-26 MED ORDER — METHIMAZOLE 10 MG PO TABS: 10.0000 mg | ORAL_TABLET | Freq: Two times a day (BID) | ORAL | 5 refills | Status: DC

## 2017-12-03 ENCOUNTER — Other Ambulatory Visit: Payer: Self-pay

## 2017-12-03 DIAGNOSIS — N959 Unspecified menopausal and perimenopausal disorder: Secondary | ICD-10-CM

## 2017-12-03 MED ORDER — ESTRADIOL 1 MG PO TABS: 1.0000 mg | ORAL_TABLET | Freq: Every day | ORAL | 4 refills | Status: DC

## 2017-12-03 NOTE — Telephone Encounter (Signed)
Refill on estradiol 1 mg tablet

## 2017-12-30 ENCOUNTER — Other Ambulatory Visit: Payer: Self-pay | Admitting: Obstetrics & Gynecology

## 2017-12-30 DIAGNOSIS — Z1231 Encounter for screening mammogram for malignant neoplasm of breast: Secondary | ICD-10-CM

## 2018-01-07 ENCOUNTER — Ambulatory Visit
Admission: RE | Admit: 2018-01-07 | Discharge: 2018-01-07 | Disposition: A | Discharge status: Home / Self Care | Payer: BLUE CROSS/BLUE SHIELD | Source: Ambulatory Visit | Attending: Obstetrics & Gynecology | Admitting: Obstetrics & Gynecology

## 2018-01-07 DIAGNOSIS — Z1231 Encounter for screening mammogram for malignant neoplasm of breast: Secondary | ICD-10-CM

## 2018-03-27 ENCOUNTER — Ambulatory Visit: Payer: BLUE CROSS/BLUE SHIELD | Admitting: Internal Medicine

## 2018-05-15 ENCOUNTER — Other Ambulatory Visit: Payer: Self-pay | Admitting: Internal Medicine

## 2018-05-15 DIAGNOSIS — E049 Nontoxic goiter, unspecified: Secondary | ICD-10-CM

## 2018-06-18 ENCOUNTER — Other Ambulatory Visit: Payer: Self-pay | Admitting: Internal Medicine

## 2018-06-18 DIAGNOSIS — E049 Nontoxic goiter, unspecified: Secondary | ICD-10-CM

## 2018-07-02 ENCOUNTER — Ambulatory Visit (HOSPITAL_COMMUNITY)
Admission: EM | Admit: 2018-07-02 | Discharge: 2018-07-02 | Disposition: A | Discharge status: Home / Self Care | Payer: BLUE CROSS/BLUE SHIELD | Attending: Family Medicine | Admitting: Family Medicine

## 2018-07-02 ENCOUNTER — Other Ambulatory Visit: Payer: Self-pay

## 2018-07-02 ENCOUNTER — Encounter (HOSPITAL_COMMUNITY): Payer: Self-pay

## 2018-07-02 DIAGNOSIS — R05 Cough: Secondary | ICD-10-CM | POA: Diagnosis not present

## 2018-07-02 DIAGNOSIS — R059 Cough, unspecified: Secondary | ICD-10-CM

## 2018-07-02 MED ORDER — BENZONATATE 200 MG PO CAPS: 200.0000 mg | ORAL_CAPSULE | Freq: Three times a day (TID) | ORAL | 0 refills | Status: DC

## 2018-07-02 MED ORDER — ALBUTEROL SULFATE HFA 108 (90 BASE) MCG/ACT IN AERS: 2.0000 | INHALATION_SPRAY | Freq: Four times a day (QID) | RESPIRATORY_TRACT | 0 refills | Status: DC | PRN

## 2018-07-02 NOTE — Discharge Instructions (Addendum)
As discussed, cannot rule out COVID. Currently, no alarming signs. Albuterol inhaler refilled to use as needed. Tessalon for cough. I would like you to quarantine for 7 days since symptoms onset AND >72 hours without fever, and improved respiratory symptoms. Continue to monitor, you can call COVID hotline [phone]) or use [facility] visit online if symptoms worsens to determine where you should seek care. If experiencing shortness of breath, trouble breathing, call 911 and provide them with your current situation.

## 2018-07-02 NOTE — ED Triage Notes (Signed)
Pt states she has a cough that been going on since last Thursday. Pt states she has been taking delsym. Pt states that someone on her job on the night shift tested positive for Covid-19. 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

## 2018-07-02 NOTE — ED Provider Notes (Signed)
[facility]    CSN: [id number] Arrival date & time: 07/02/18  3299     History   Chief Complaint Chief Complaint  Patient presents with  . Cough    HPI Ladan Vanderzanden is a 58 y.o. female.   58 year old female with history of asthma comes in for 6 day history of cough, rhinorrhea, sore throat, chills. Denies body aches, fever, night sweats. Denies shortness of breath. She has had some intermittent wheezing, has not been able to find inhaler. States someone at work was tested positive for COVID, but did not have direct contact with the person. She has tried delsym without relief. Current every day smoker. Denies antipyretics in the last 8 hours.      Past Medical History:  Diagnosis Date  . Asthma   . History of umbilical hernia   . Renal stones     Patient Active Problem List   Diagnosis Date Noted  . 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 Graves disease 04/15/2017  . 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 Encounter for screening for HIV 09/29/2015  . Pre-diabetes 09/29/2015  . Status post TAH-BSO 09/21/2015  . Chronic knee pain 12/23/2014  . Tendonitis of wrist, left 12/11/2014  . Preventative health care 09/07/2014  . Back pain 06/04/2013  . Encounter for smoking cessation counseling 05/23/2012    Past Surgical History:  Procedure Laterality Date  . lithotrypsy    . UMBILICAL HERNIA REPAIR  1990    OB History   No obstetric history on file.      Home Medications    Prior to Admission medications   Medication Sig Start Date End Date Taking? Authorizing Provider  albuterol (VENTOLIN HFA) 108 (90 Base) MCG/ACT inhaler Inhale 2 puffs into the lungs every 6 (six) hours as needed for wheezing or shortness of breath. 07/02/18   Tasia Catchings, Amy V, PA-C  Ascorbic Acid (VITAMIN C PO) Take 1 tablet by mouth daily. Reported on 06/13/2015    [provider]  benzonatate (TESSALON) 200 MG capsule Take 1 capsule (200 mg total) by mouth every 8 (eight) hours. 07/02/18   Tasia Catchings,  Amy V, PA-C  estradiol (ESTRACE) 1 MG tablet Take 1 tablet (1 mg total) by mouth daily. 12/03/17   Marchia Meiers, MD  ibuprofen (ADVIL,MOTRIN) 600 MG tablet Take 1 tablet (600 mg total) by mouth every 6 (six) hours as needed (mild pain). 09/27/15   Shela Leff, MD  methimazole (TAPAZOLE) 10 MG tablet Take 1 tablet (10 mg total) by mouth 2 (two) times daily. 11/26/17   Philemon Kingdom, MD  propranolol (INDERAL) 10 MG tablet Take 1 tablet by mouth twice daily 06/18/18   Annia Belt, MD    Family History Family History  Problem Relation Age of Onset  . Cancer Mother   . Diabetes Father   . Colon cancer Neg Hx     Social History Social History   Tobacco Use  . Smoking status: Current Every Day Smoker    Packs/day: 1.00    Years: 39.00    Pack years: 39.00    Types: Cigarettes  . Smokeless tobacco: Never Used  . Tobacco comment: wants Nicotine gum. cutting back  Substance Use Topics  . Alcohol use: No    Alcohol/week: 0.0 standard drinks    Comment: Quit several years ago;recovering alcoholic; no alcohol in 5 years  . Drug use: No    Types: Cocaine    Comment: Quit several years ago, never did IVDU only smoked cocaine  Allergies   Patient has no known allergies.   Review of Systems Review of Systems  Reason unable to perform ROS: See HPI as above.     Physical Exam Triage Vital Signs ED Triage Vitals  Enc Vitals Group     BP 07/02/18 0942 134/87     Pulse Rate 07/02/18 0942 86     Resp 07/02/18 0942 18     Temp 07/02/18 0942 98.6 F (37 C)     Temp Source 07/02/18 0942 Oral     SpO2 07/02/18 0942 98 %     Weight 07/02/18 0938 197 lb (89.4 kg)     Height --      Head Circumference --      Peak Flow --      Pain Score --      Pain Loc --      Pain Edu? --      Excl. in [place]? --    No data found.  Updated Vital Signs BP 134/87 (BP Location: Left Arm)   Pulse 86   Temp 98.6 F (37 C) (Oral)   Resp 18   Wt 197 lb (89.4 kg)   LMP  03/14/2015 (Approximate)   SpO2 98%   BMI 33.81 kg/m   Physical Exam Constitutional:      General: She is not in acute distress.    Appearance: Normal appearance. She is not ill-appearing, toxic-appearing or diaphoretic.  HENT:     Head: Normocephalic and atraumatic.     Mouth/Throat:     Mouth: Mucous membranes are moist.     Pharynx: Oropharynx is clear. Uvula midline.  Neck:     Musculoskeletal: Normal range of motion and neck supple.  Cardiovascular:     Rate and Rhythm: Normal rate and regular rhythm.     Heart sounds: Normal heart sounds. No murmur. No friction rub. No gallop.   Pulmonary:     Effort: Pulmonary effort is normal. No accessory muscle usage, prolonged expiration, respiratory distress or retractions.     Comments: Lungs clear to auscultation without adventitious lung sounds. Neurological:     General: No focal deficit present.     Mental Status: She is alert and oriented to person, place, and time.      UC Treatments / Results  Labs (all labs ordered are listed, but only abnormal results are displayed) Labs Reviewed - No data to display  EKG None  Radiology No results found.  Procedures Procedures (including critical care time)  Medications Ordered in UC Medications - No data to display  Initial Impression / Assessment and Plan / UC Course  I have reviewed the triage vital signs and the nursing notes.  Pertinent labs & imaging results that were available during my care of the patient were reviewed by me and considered in my medical decision making (see chart for details).    Patient speaking in full sentences without respiratory distress. Afebrile without antipyretic in last 8 hours. No tachycardia, tachypnea. Lungs CTAB. COVID testing limited to inpatient. Given history and exam, will have patient self quarantine. Instructions on when to end quarantine, family member isolation discussed, and resources provided. Albuterol inhaler refilled.  Symptomatic treatment discussed. Return precautions given. Patient expresses understanding and agrees to plan.  Final Clinical Impressions(s) / UC Diagnoses   Final diagnoses:  Cough    ED Prescriptions    Medication Sig Dispense Auth. Provider   albuterol (VENTOLIN HFA) 108 (90 Base) MCG/ACT inhaler Inhale 2 puffs into the  lungs every 6 (six) hours as needed for wheezing or shortness of breath. 1 Inhaler Yu, Amy V, PA-C   benzonatate (TESSALON) 200 MG capsule Take 1 capsule (200 mg total) by mouth every 8 (eight) hours. 21 capsule Tobin Chad, [place] 07/02/18 1021

## 2018-07-02 NOTE — ED Notes (Signed)
Patient verbalizes understanding of discharge instructions. Opportunity for questioning and answers were provided. Patient discharged from [facility] by RN.  

## 2018-07-03 ENCOUNTER — Ambulatory Visit (INDEPENDENT_AMBULATORY_CARE_PROVIDER_SITE_OTHER): Payer: BLUE CROSS/BLUE SHIELD | Admitting: Internal Medicine

## 2018-07-03 ENCOUNTER — Encounter: Payer: Self-pay | Admitting: Internal Medicine

## 2018-07-03 DIAGNOSIS — E0500 Thyrotoxicosis with diffuse goiter without thyrotoxic crisis or storm: Secondary | ICD-10-CM | POA: Diagnosis not present

## 2018-07-03 DIAGNOSIS — R635 Abnormal weight gain: Secondary | ICD-10-CM

## 2018-07-03 NOTE — Patient Instructions (Addendum)
Please come back for labs in 2 week.  Please continue methimazole 10 mg twice a day for now.  We will most likely need to reduce the dose after the results are back.  Check your pulse at home.  If lower than 90 at rest, decrease Inderal to 10 mg daily for a week, then 10 mg every other day for another week, then stop.  Please come back for a follow-up appointment in 4 months, but likely sooner for labs.

## 2018-07-03 NOTE — Progress Notes (Signed)
Patient ID: Francile Woolford, female   DOB: 03-Feb-1961, 58 y.o.   MRN: [id number]   Patient location: Home My location: Office  Referring Provider: Velna Ochs, MD  I connected with the patient on 07/03/18 at 12:53 PM EDT by a video enabled telemedicine application and verified that I am speaking with the correct person.   I discussed the limitations of evaluation and management by telemedicine and the availability of in person appointments. The patient expressed understanding and agreed to proceed.   Details of the encounter are shown below.  HPI  Nawal Burling is a 58 y.o.-year-old female, returning for follow-up for Graves' disease and Graves' ophthalmopathy.  Last visit 7 months ago.  She got furloughed until 1 mo from now.  She went to Urgent Care yesterday because of a cough. Taking Delsym, benzonatate. Covid 19 test pending.  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 Labs remained abnormal 2 weeks later.  At that time, she was started on: - Methimazole 10 mg 3x a day - Propranolol 10 mg 2x a day  Retrospectively, she did have: - hot flushes (but had a hysterectomy) - L eye bulging - some blurry vision, but not diplopia, chemosis, pain - weight loss: 20 lbs - in last 4 mo - lack of appetite - tremors - palpitations - better on beta blockers - no hyperdefecation - no insomnia - + anxiety  She was started on methimazole 10 mg 3x a day and Inderal 10 mg 2x a day.  The above symptoms have now resolved, except for hot flashes-she is on HRT.  However, since last visit, she had a significant weight gain of 12 pounds.  Before last visit, I advised her to decrease the methimazole dose to 10 mg twice a day but she did not do so.  We did decrease this dose at last visit.  However, she did not return in 1.5 months for labs, as advised...  Reviewed her TFTs, Lab Results   Component Value Date   TSH 0.59 11/25/2017   TSH <0.01 Repeated and verified X2. (L) 07/26/2017   TSH <0.006 (L) 04/15/2017   TSH <0.010 (L) 04/02/2017   FREET4 0.63 11/25/2017   FREET4 1.10 07/26/2017   FREET4 3.04 (H) 04/15/2017   FREET4 2.34 (H) 04/02/2017   T3FREE 3.0 11/25/2017   T3FREE 3.6 07/26/2017   Her thyrotropin receptor antibodies and TSI's were elevated, indicative of Graves' disease: Component     Latest Ref Rng & Units 04/15/2017  Thyrotropin Receptor Ab     0.00 - 1.75 IU/L 12.16 (H)   Lab Results  Component Value Date   TSI 319 (H) 07/26/2017   Pt denies: - feeling nodules in neck - hoarseness - dysphagia - choking - SOB with lying down  Pt does not have a FH of thyroid ds. No FH of thyroid cancer. No h/o radiation tx to head or neck.  No seaweed or kelp. No recent contrast studies. No herbal supplements. No Biotin use. No recent steroids use.   Pt. also has a history of kidney stones in 2012, 2014, 2016, 2018.  She has a history of lithotripsy.  She saw her ophthalmologist before last visit and I reviewed the report but there was no evidence of Graves' ophthalmopathy.  She was advised to return in a year.  ROS: Constitutional: + weight gain/no weight loss, no fatigue, no subjective hyperthermia, no subjective  hypothermia Eyes: no blurry vision, no xerophthalmia ENT: no sore throat, + see HPI Cardiovascular: no CP/no SOB/no palpitations/no leg swelling Respiratory: + cough/no SOB/no wheezing Gastrointestinal: no N/no V/no D/no C/no acid reflux Musculoskeletal: no muscle aches/no joint aches Skin: no rashes, no hair loss Neurological: no tremors/no numbness/no tingling/no dizziness  I reviewed pt's medications, allergies, PMH, social hx, family hx, and changes were documented in the history of present illness. Otherwise, unchanged from my initial visit note.  Past Medical History:  Diagnosis Date  . Asthma   . History of umbilical hernia   .  Renal stones    Past Surgical History:  Procedure Laterality Date  . lithotrypsy    . UMBILICAL HERNIA REPAIR  1990   Social History   Socioeconomic History  . Marital status: Married    Spouse name: Not on file  . Number of children: 57: 56 year old in 2019  . Years of education: Not on file  . Highest education level: Not on file  Occupational History  .  Transport planner  Social Needs  . Financial resource strain: Not on file  . Food insecurity:    Worry: Not on file    Inability: Not on file  . Transportation needs:    Medical: Not on file    Non-medical: Not on file  Tobacco Use  . Smoking status: Current Every Day Smoker    Packs/day: 1.00    Years: 39.00    Pack years: 39.00    Types: Cigarettes  . Smokeless tobacco: Never Used  . Tobacco comment: wants Nicotine gum. cutting back  Substance and Sexual Activity  . 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 Drug use: No    Types: Cocaine    Comment: Quit several years ago, never did IVDU only smoked cocaine  . Sexual activity: Yes    Birth control/protection: Other-see comments    Comment: In same sex relationship for 19 years   Current Outpatient Medications on File Prior to Visit  Medication Sig Dispense Refill  . albuterol (VENTOLIN HFA) 108 (90 Base) MCG/ACT inhaler Inhale 2 puffs into the lungs every 6 (six) hours as needed for wheezing or shortness of breath. 1 Inhaler 0  . Ascorbic Acid (VITAMIN C PO) Take 1 tablet by mouth daily. Reported on 06/13/2015    . benzonatate (TESSALON) 200 MG capsule Take 1 capsule (200 mg total) by mouth every 8 (eight) hours. 21 capsule 0  . estradiol (ESTRACE) 1 MG tablet Take 1 tablet (1 mg total) by mouth daily. 90 tablet 4  . ibuprofen (ADVIL,MOTRIN) 600 MG tablet Take 1 tablet (600 mg total) by mouth every 6 (six) hours as needed (mild pain). 20 tablet 0  . methimazole (TAPAZOLE) 10 MG tablet  Take 1 tablet (10 mg total) by mouth 2 (two) times daily. 180 tablet 5  . propranolol (INDERAL) 10 MG tablet Take 1 tablet by mouth twice daily 180 tablet 0   No current facility-administered medications on file prior to visit.    No Known Allergies Family History  Problem Relation Age of Onset  . Cancer Mother   . Diabetes Father   . Colon cancer Neg Hx    PE: LMP 03/14/2015 (Approximate)  Wt Readings from Last 3 Encounters:  07/02/18 197 lb (89.4 kg)  11/25/17 184 lb (83.5 kg)  07/26/17 177 lb 3.2 oz (80.4 kg)   Constitutional:  in NAD  The physical exam was not performed (virtual visit).  ASSESSMENT: 1. Graves ds.   2.  Graves' ophthalmopathy  3.  Weight gain  PLAN:  1. Patient with history of enlarged thyroid discovered by patient when she was looking in the mirror in 03/2017.  Around that time, she was told that her left eye was mildly bulging.  She saw PCP and she was found to have thyrotoxicosis.  Labs remain abnormal 2 weeks later.  Thyrotropin receptor antibodies were elevated, confirming Graves' disease.  She was started on methimazole 10 mg 3 times a day and Inderal 10 mg twice a day.  Retrospectively, she also noticed weight loss, heat intolerance, palpitations, tremors, increased appetite, anxiety.  The symptoms resolved after starting the above medications.  However, at last visit she was still having hot flashes, which were chronic.  She is postmenopausal.  She also had weight gain after starting treatment.  Before last visit, I messaged her throat my chart to reduce the methimazole to 10 mg twice a day but she did not do this.  Therefore, at last visit, she continued on 10 mg 3 times a day.  TFTs actually returned to normal.  I did advise her to try to reduce the methimazole dose to 10 mg twice a day and return for labs in 1.5 months, but she did not return to the clinic... -At this visit, she is still feeling well, without complaints other than hot flashes -We will  recheck her tests as soon as safe to come to the clinic (will aim for 2 weeks - her Covid19 test results return tomorrow) and I expect that we will be able to reduce the methimazole dose even further then. -I advised her to check her pulse at home and if lower than 90 at rest, to stop Inderal -I will see her back in 4 months, but we will be in touch sooner after the results come back  2.  Graves' ophthalmopathy -She denies chemosis, diplopia, pain with eye movement -Her TSI antibodies were elevated -Continue to follow-up with ophthalmology  3.  Weight gain -Patient gained approximately 20 pounds in the last year.  Since last visit, she gained 46 -I suspect that the methimazole dose is excessive. -We again discussed that taking the methimazole at excessive doses can lead to weight gain due to the possibility of hypothyroidism.  She absolutely needs to come back sooner for labs from now on.  Philemon Kingdom, MD PhD [facility]

## 2018-07-16 ENCOUNTER — Other Ambulatory Visit (INDEPENDENT_AMBULATORY_CARE_PROVIDER_SITE_OTHER): Payer: BLUE CROSS/BLUE SHIELD

## 2018-07-16 ENCOUNTER — Other Ambulatory Visit: Payer: Self-pay

## 2018-07-16 DIAGNOSIS — E0500 Thyrotoxicosis with diffuse goiter without thyrotoxic crisis or storm: Secondary | ICD-10-CM

## 2018-07-16 LAB — TSH: TSH: 11.77 u[IU]/mL — ABNORMAL HIGH (ref 0.35–4.50)

## 2018-07-16 LAB — T3, FREE: T3, Free: 2.9 pg/mL (ref 2.3–4.2)

## 2018-07-16 LAB — T4, FREE: Free T4: 0.5 ng/dL — ABNORMAL LOW (ref 0.60–1.60)

## 2018-07-18 ENCOUNTER — Other Ambulatory Visit: Payer: Self-pay | Admitting: Internal Medicine

## 2018-07-18 DIAGNOSIS — E0500 Thyrotoxicosis with diffuse goiter without thyrotoxic crisis or storm: Secondary | ICD-10-CM

## 2018-07-18 MED ORDER — METHIMAZOLE 5 MG PO TABS: 5.0000 mg | ORAL_TABLET | Freq: Two times a day (BID) | ORAL | 2 refills | Status: DC

## 2018-11-05 ENCOUNTER — Encounter: Payer: Self-pay | Admitting: Internal Medicine

## 2018-11-05 ENCOUNTER — Encounter: Payer: BLUE CROSS/BLUE SHIELD | Admitting: Internal Medicine

## 2018-11-05 ENCOUNTER — Ambulatory Visit: Payer: BC Managed Care – PPO | Admitting: Internal Medicine

## 2018-11-05 ENCOUNTER — Other Ambulatory Visit: Payer: Self-pay

## 2018-11-05 VITALS — BP 160/100 | HR 60 | Ht 66.0 in | Wt 196.0 lb

## 2018-11-05 VITALS — BP 146/105 | HR 52 | Temp 97.9°F | Ht 66.0 in | Wt 199.1 lb

## 2018-11-05 DIAGNOSIS — R202 Paresthesia of skin: Secondary | ICD-10-CM | POA: Insufficient documentation

## 2018-11-05 DIAGNOSIS — R7303 Prediabetes: Secondary | ICD-10-CM | POA: Diagnosis not present

## 2018-11-05 DIAGNOSIS — F17210 Nicotine dependence, cigarettes, uncomplicated: Secondary | ICD-10-CM

## 2018-11-05 DIAGNOSIS — R030 Elevated blood-pressure reading, without diagnosis of hypertension: Secondary | ICD-10-CM | POA: Diagnosis not present

## 2018-11-05 DIAGNOSIS — E66811 Obesity, class 1: Secondary | ICD-10-CM

## 2018-11-05 DIAGNOSIS — E669 Obesity, unspecified: Secondary | ICD-10-CM

## 2018-11-05 DIAGNOSIS — Z716 Tobacco abuse counseling: Secondary | ICD-10-CM

## 2018-11-05 DIAGNOSIS — E0500 Thyrotoxicosis with diffuse goiter without thyrotoxic crisis or storm: Secondary | ICD-10-CM | POA: Diagnosis not present

## 2018-11-05 DIAGNOSIS — E039 Hypothyroidism, unspecified: Secondary | ICD-10-CM | POA: Diagnosis not present

## 2018-11-05 DIAGNOSIS — I10 Essential (primary) hypertension: Secondary | ICD-10-CM | POA: Insufficient documentation

## 2018-11-05 LAB — T4, FREE: Free T4: 0.77 ng/dL (ref 0.60–1.60)

## 2018-11-05 LAB — POCT GLYCOSYLATED HEMOGLOBIN (HGB A1C): Hemoglobin A1C: 5.7 % — AB (ref 4.0–5.6)

## 2018-11-05 LAB — GLUCOSE, CAPILLARY: Glucose-Capillary: 81 mg/dL (ref 70–99)

## 2018-11-05 LAB — T3, FREE: T3, Free: 3 pg/mL (ref 2.3–4.2)

## 2018-11-05 LAB — TSH: TSH: 1.32 u[IU]/mL (ref 0.35–4.50)

## 2018-11-05 MED ORDER — NICOTINE 14 MG/24HR TD PT24: 14.0000 mg | MEDICATED_PATCH | TRANSDERMAL | 0 refills | Status: AC

## 2018-11-05 MED ORDER — METHIMAZOLE 5 MG PO TABS: 5.0000 mg | ORAL_TABLET | Freq: Two times a day (BID) | ORAL | 2 refills | Status: DC

## 2018-11-05 MED ORDER — NICOTINE 7 MG/24HR TD PT24: 7.0000 mg | MEDICATED_PATCH | TRANSDERMAL | 0 refills | Status: AC

## 2018-11-05 NOTE — Assessment & Plan Note (Addendum)
Patient has graves disease managed by endocrinology. Last TFTs consistent with hypothyroidism, and her methimazole dose was decreased. She is currently taking 1/2 tablet twice a day. Unclear but she believes the tablets are 10 mg. She does not have her prescriptions with her today. She has stopped taking propanolol. She is bradycardic today in the 50s. Asymptomatic. She has follow up with endocrinology and repeat labs scheduled for later today. Will continue to monitor.

## 2018-11-05 NOTE — Assessment & Plan Note (Signed)
Patient has been trying to quit smoking. Her last cigarette was Monday afternoon. She was previously smoking 1 ppd and would like to try nicotine patches. Prescription sent to pharmacy. -- 21 mg patches qd x 1 months; then 7 mg patches qd x 1 month; then stop

## 2018-11-05 NOTE — Progress Notes (Signed)
Subjective:   Patient ID: Christy Burke female   DOB: 02-Sep-1960 58 y.o.   MRN: [id number]  HPI: Christy Burke is a 58 y.o. female with past medical history outlined below here for numbness & tingling in her left arm. For the details of today's visit, please refer to the assessment and plan.   Past Medical History:  Diagnosis Date  . Asthma   . History of umbilical hernia   . Renal stones    Current Outpatient Medications  Medication Sig Dispense Refill  . albuterol (VENTOLIN HFA) 108 (90 Base) MCG/ACT inhaler Inhale 2 puffs into the lungs every 6 (six) hours as needed for wheezing or shortness of breath. 1 Inhaler 0  . Ascorbic Acid (VITAMIN C PO) Take 1 tablet by mouth daily. Reported on 06/13/2015    . estradiol (ESTRACE) 1 MG tablet Take 1 tablet (1 mg total) by mouth daily. 90 tablet 4  . ibuprofen (ADVIL,MOTRIN) 600 MG tablet Take 1 tablet (600 mg total) by mouth every 6 (six) hours as needed (mild pain). 20 tablet 0  . methimazole (TAPAZOLE) 5 MG tablet Take 1 tablet (5 mg total) by mouth 2 (two) times daily. 120 tablet 2  . nicotine (NICODERM CQ - DOSED IN MG/24 HOURS) 14 mg/24hr patch Place 1 patch (14 mg total) onto the skin daily. 30 patch 0  . [START ON 12/04/2018] nicotine (NICODERM CQ - DOSED IN MG/24 HR) 7 mg/24hr patch Place 1 patch (7 mg total) onto the skin daily. 30 patch 0  . propranolol (INDERAL) 10 MG tablet Take 1 tablet by mouth twice daily 180 tablet 0   No current facility-administered medications for this visit.    Family History  Problem Relation Age of Onset  . Cancer Mother   . Diabetes Father   . Colon cancer Neg Hx    Social History   Socioeconomic History  . Marital status: Married    Spouse name: Not on file  . Number of children: Not on file  . Years of education: Not on file  . Highest education level: Not on file  Occupational History  . Not on file  Social Needs  . Financial resource strain: Not on file  . Food insecurity   Worry: Not on file    Inability: Not on file  . Transportation needs    Medical: Not on file    Non-medical: Not on file  Tobacco Use  . Smoking status: Current Some Day Smoker    Packs/day: 1.00    Years: 39.00    Pack years: 39.00    Types: Cigarettes  . Smokeless tobacco: Never Used  . Tobacco comment: wants patches.  Substance and Sexual Activity  . Alcohol use: No    Alcohol/week: 0.0 standard drinks    Comment: Quit several years ago;recovering alcoholic; no alcohol in 5 years  . Drug use: No    Types: Cocaine    Comment: Quit several years ago, never did IVDU only smoked cocaine  . Sexual activity: Yes    Birth control/protection: Other-see comments    Comment: In same sex relationship for 19 years  Lifestyle  . Physical activity    Days per week: Not on file    Minutes per session: Not on file  . Stress: Not on file  Relationships  . Social Herbalist on phone: Not on file    Gets together: Not on file    Attends religious service: Not on file  Active member of club or organization: Not on file    Attends meetings of clubs or organizations: Not on file    Relationship status: Not on file  Other Topics Concern  . Not on file  Social History Narrative  . Not on file   Review of Systems: Respiratory: negative for cough Cardiovascular: negative for chest pain and dyspnea   Objective:  Physical Exam: Vitals:   11/05/18 0824 11/05/18 0858  BP: (!) 139/105 (!) 146/105  Pulse: (!) 58 (!) 52  Temp: 97.9 F (36.6 C)   TempSrc: Oral   SpO2: 100%   Weight: 199 lb 1.6 oz (90.3 kg)   Height: 5\' 6"  (1.676 m)    Physical Exam Constitutional: NAD, appears comfortable HEENT: Normal oropharynx, nontender, diffusely enlarged goiter  Cardiovascular: Bradycardic but regular, no murmurs, rubs, or gallops.  Pulmonary/Chest: CTAB, no wheezes, rales, or rhonchi.  Extremities: Warm and well perfused. No edema.  Psychiatric: Normal mood and affect  Assessment  & Plan:

## 2018-11-05 NOTE — Progress Notes (Signed)
Patient ID: Christy Burke, female   DOB: 06/11/1960, 58 y.o.   MRN: [id number]   HPI  Christy Burke is a 58 y.o.-year-old female, returning for follow-up for Graves' disease and Graves' ophthalmopathy.  Last visit 4 months ago (virtual).  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 Labs remained abnormal 2 weeks later.  At that time, she was started on: - Methimazole 10 mg 3x a day - Propranolol 10 mg 2x a day  Retrospectively, she did have: - hot flushes (but had a hysterectomy) - L eye bulging - some blurry vision, but not diplopia, chemosis, pain - weight loss: 20 lbs - in last 4 mo - lack of appetite - tremors - palpitations - better on beta blockers - no hyperdefecation - no insomnia - + anxiety  She was started on methimazole 10 mg 3x a day and Inderal 10 mg 2x a day.  The above symptoms have resolved, except for hot flashes (she is on HRT).  She gained weight, however.  I tried to change her methimazole dose between appointments, but she was not compliant with the medication changes and labs... When she returned for last visit in 06/2018, she was on 10 mg twice a day.  At last check, her TSH was elevated, so I advised her to reduce the methimazole dose to 5 mg twice a day and come back for labs in 1.5 months.  She did not return...  She tells me she feels good on the above dose, without complaints.  She mentions weight gain, however, by our scale, her weight is stable since 06/2018.  Reviewed her TFTs: Lab Results  Component Value Date   TSH 11.77 (H) 07/16/2018   TSH 0.59 11/25/2017   TSH <0.01 Repeated and verified X2. (L) 07/26/2017   TSH <0.006 (L) 04/15/2017   TSH <0.010 (L) 04/02/2017   FREET4 0.50 (L) 07/16/2018   FREET4 0.63 11/25/2017   FREET4 1.10 07/26/2017   FREET4 3.04 (H) 04/15/2017   FREET4 2.34 (H) 04/02/2017   T3FREE 2.9 07/16/2018   T3FREE 3.0  11/25/2017   T3FREE 3.6 07/26/2017   Her thyroid antibodies were elevated (TRAb and TSI) indicating Graves' disease: Component     Latest Ref Rng & Units 04/15/2017  Thyrotropin Receptor Ab     0.00 - 1.75 IU/L 12.16 (H)   Lab Results  Component Value Date   TSI 319 (H) 07/26/2017   Pt denies: - feeling nodules in neck - hoarseness - dysphagia - choking - SOB with lying down  Pt does not have a FH of thyroid ds. No FH of thyroid cancer. No h/o radiation tx to head or neck.  No herbal supplements. No Biotin use. No recent steroids use.   Pt. also has a history of kidney stones in 2012, 2014, 2016, 2018.  She has a history of lithotripsy.  She saw her ophthalmologist in the past and I reviewed the report: there was no evidence of Graves' ophthalmopathy.  She continues to follow with them yearly.  She is trying to quit smoking - now second day.  Right before the visit, she had a stressful conversation with her granddaughter.  She feels that this is why her blood pressure is high today.  ROS: Constitutional: + weight gain/no weight loss, no fatigue, no subjective hyperthermia, no subjective hypothermia Eyes: no blurry vision, no xerophthalmia ENT: no sore throat, + see HPI  Cardiovascular: no CP/no SOB/no palpitations/no leg swelling Respiratory: no cough/no SOB/no wheezing Gastrointestinal: no N/no V/no D/no C/no acid reflux Musculoskeletal: no muscle aches/no joint aches Skin: no rashes, no hair loss Neurological: no tremors/no numbness/no tingling/no dizziness  I reviewed pt's medications, allergies, PMH, social hx, family hx, and changes were documented in the history of present illness. Otherwise, unchanged from my initial visit note.  Past Medical History:  Diagnosis Date  . Asthma   . History of umbilical hernia   . Renal stones    Past Surgical History:  Procedure Laterality Date  . lithotrypsy    . UMBILICAL HERNIA REPAIR  1990   Social History    Socioeconomic History  . Marital status: Married    Spouse name: Not on file  . Number of children: 45: 64 year old in 2019  . Years of education: Not on file  . Highest education level: Not on file  Occupational History  .  Transport planner  Social Needs  . Financial resource strain: Not on file  . Food insecurity:    Worry: Not on file    Inability: Not on file  . Transportation needs:    Medical: Not on file    Non-medical: Not on file  Tobacco Use  . Smoking status: Current Every Day Smoker    Packs/day: 1.00    Years: 39.00    Pack years: 39.00    Types: Cigarettes  . Smokeless tobacco: Never Used  . Tobacco comment: wants Nicotine gum. cutting back  Substance and Sexual Activity  . 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 Drug use: No    Types: Cocaine    Comment: Quit several years ago, never did IVDU only smoked cocaine  . Sexual activity: Yes    Birth control/protection: Other-see comments    Comment: In same sex relationship for 19 years   Current Outpatient Medications on File Prior to Visit  Medication Sig Dispense Refill  . albuterol (VENTOLIN HFA) 108 (90 Base) MCG/ACT inhaler Inhale 2 puffs into the lungs every 6 (six) hours as needed for wheezing or shortness of breath. 1 Inhaler 0  . Ascorbic Acid (VITAMIN C PO) Take 1 tablet by mouth daily. Reported on 06/13/2015    . benzonatate (TESSALON) 200 MG capsule Take 1 capsule (200 mg total) by mouth every 8 (eight) hours. 21 capsule 0  . estradiol (ESTRACE) 1 MG tablet Take 1 tablet (1 mg total) by mouth daily. 90 tablet 4  . ibuprofen (ADVIL,MOTRIN) 600 MG tablet Take 1 tablet (600 mg total) by mouth every 6 (six) hours as needed (mild pain). 20 tablet 0  . methimazole (TAPAZOLE) 5 MG tablet Take 1 tablet (5 mg total) by mouth 2 (two) times daily. 120 tablet 2  . propranolol (INDERAL) 10 MG tablet Take 1 tablet by mouth twice  daily 180 tablet 0   No current facility-administered medications on file prior to visit.    No Known Allergies Family History  Problem Relation Age of Onset  . Cancer Mother   . Diabetes Father   . Colon cancer Neg Hx    PE: BP (!) 160/100   Pulse 60   Ht 5\' 6"  (1.676 m) Comment: measured today without shoes  Wt 196 lb (88.9 kg)   LMP 03/14/2015 (Approximate)   BMI 31.64 kg/m  Wt Readings from Last 3 Encounters:  11/05/18 196 lb (88.9  kg)  11/05/18 199 lb 1.6 oz (90.3 kg)  07/02/18 197 lb (89.4 kg)   Constitutional: overweight, in NAD Eyes: PERRLA, EOMI, no exophthalmos ENT: moist mucous membranes, no thyromegaly, no cervical lymphadenopathy Cardiovascular: RRR, No MRG Respiratory: CTA B Gastrointestinal: abdomen soft, NT, ND, BS+ Musculoskeletal: no deformities, strength intact in all 4 Skin: moist, warm, no rashes Neurological: no tremor with outstretched hands, DTR normal in all 4  ASSESSMENT: 1. Graves ds.   2.  Graves' ophthalmopathy  3.  Weight gain  PLAN:  1. Patient with history of enlarged thyroid discovered by patient when she was looking in the mirror in 03/2017.  Around that time, she was told that her left eye was mildly bulging.  She saw PCP and she was found to have thyrotoxicosis.  Labs remained abnormal 2 weeks later and she was also found to have a high thyrotropin receptor antibody titer.  This confirmed Graves' disease.  She was started on methimazole 10 mg 3 times a day and Inderal 10 mg twice a day.  Retrospectively, she also noticed weight loss, heat intolerance, palpitations, tremors, increased appetite, anxiety.  The symptoms resolved after starting the above medications.  She was noncompliant with thyroid hormone checks, but she did decrease the dose of methimazole since last visit to 5 mg twice a day.  She feels good on this dose but she is not happy about her weight gain.  Reviewing the chart, her weight is stable in the last 5 months,  though. -No complaints at this visit -Reviewed latest TSH which was high, at 11, since she did not come back for repeat labs while on methimazole and we could not adjust her methimazole dose between visits -Today we will check her TFTs and change the methimazole dose accordingly.  We discussed about the importance of coming for labs 5 to 6 weeks after any methimazole dose change.  She understands. -I will see her in 4 months, but hopefully sooner for labs  2.  Graves' ophthalmopathy -No chemosis, diplopia, pain with eye movement -Her TSI antibodies were elevated; we will not repeat them today. -We need to follow-up with ophthalmology  3.  Weight gain -Weight stable since last visit, but overall, she did gain a significant amount of weight after resolution of her Graves' disease (last year, approximately 23 pounds) -The fact that she was noncompliant with lab checks puts her at higher risk for over replacement with methimazole and, indeed, her latest TSH was high, at 80.  This is conducive to weight gain. - at this visit we will check her TFTs and I am hoping that we can increase the methimazole dose  Component     Latest Ref Rng & Units 11/05/2018  TSH     0.35 - 4.50 uIU/mL 1.32  T4,Free(Direct)     0.60 - 1.60 ng/dL 0.77  Triiodothyronine,Free,Serum     2.3 - 4.2 pg/mL 3.0   TFTs are all normal.  I would like to continue on this dose of methimazole for now and recheck her test in 1.5 months.  Philemon Kingdom, MD PhD [facility] End[facility]

## 2018-11-05 NOTE — Assessment & Plan Note (Signed)
Patient had elevated hgb A1c three years ago consistent with pre-diabetes. She has gained 28 lbs since February. Repeat Hgb A1c today is unchanged, 5.7. We discussed diet and lifestyle modifications. She is planning to start exercising again and is interested in meeting with our clinic dietitian. She was also supratherapeutic on methimazole earlier this year with labs consistent with hypothyroidism, which is likely contributing. Endocrinology has been titrating her methimazole dose. She has followed up scheduled today with repeat labs.  -- Referral to medical nutrition therapy  -- Repeat Hgb A1c 1 year

## 2018-11-05 NOTE — Patient Instructions (Signed)
Please continue methimazole 5 mg twice a day for now.  We will most likely need to reduce the dose after the results are back.  Please come back for a follow-up appointment in 4 months, but likely sooner for labs.

## 2018-11-05 NOTE — Assessment & Plan Note (Addendum)
Blood pressure is elevated today, 139/105 and 146/105 on recheck. She has gained a significant amount of weight over the past 6 months. Recent thyroid function tests consistent with hypothyroidism and her methimazole dose was decreased per endocrinology. She does not have prior history of hypertension. We discussed lifestyle modification. She is motivate to lose weight as she does not want to start another medication. Agreeable to meeting with our clinic dietitian. If blood pressure remains elevated despite methimazole optimization and lifestyle modifications / weight loss, will need to start antihypertensive therapy.  -- Follow up 3 months  -- Referral to dietician

## 2018-11-05 NOTE — Patient Instructions (Addendum)
Ms. Barsoum,  It was a pleasure to see you again. Please continue to take your medicine as previously prescribed.  For the nicotine patches, please use the 14 mg patches once a day for a month, then the 7 mg patches for a month, then stop.   I have also referred you to meet with our clinic dietitian. You can schedule this appointment when you check out.  Follow up with me again in 3 months or sooner if you have any issues. If you have any questions or concerns, call our clinic at [phone] or after hours call [phone] and ask for the internal medicine resident on call. Thank you!  Dr. Philipp Ovens

## 2018-11-05 NOTE — Assessment & Plan Note (Addendum)
Patient is complaining of intermittent numbness and tingling in her left forearm. Denies weakness, no neck pain, no involvement of her fingers or hands. Does not sound consistent with carpal tunnel or cervical radiculopathy. Unclear reason for symptoms. Doubt peripheral neuropathy from thyroid dysfunction or Vit B12 deficiency, but will check Vit B12 level for completion. Hgb A1c today is consistent with pre diabetes. Encouraged patient to follow up with endocrinology for methimazole titration.  -- F/u Vit B12   ADDENDUM: Vitamin B12 level normal. Called patient, left VM with results.

## 2018-11-06 LAB — VITAMIN B12: Vitamin B-12: 654 pg/mL (ref 232–1245)

## 2018-11-11 ENCOUNTER — Encounter: Payer: Self-pay | Admitting: Dietician

## 2018-11-11 ENCOUNTER — Ambulatory Visit (INDEPENDENT_AMBULATORY_CARE_PROVIDER_SITE_OTHER): Payer: BC Managed Care – PPO | Admitting: Dietician

## 2018-11-11 ENCOUNTER — Other Ambulatory Visit: Payer: Self-pay

## 2018-11-11 DIAGNOSIS — Z6831 Body mass index (BMI) 31.0-31.9, adult: Secondary | ICD-10-CM | POA: Diagnosis not present

## 2018-11-11 DIAGNOSIS — Z713 Dietary counseling and surveillance: Secondary | ICD-10-CM

## 2018-11-11 DIAGNOSIS — R7303 Prediabetes: Secondary | ICD-10-CM | POA: Diagnosis not present

## 2018-11-11 NOTE — Patient Instructions (Signed)
Your goal for the next 2 weeks:  1- drink more water- to do this I will buy more water 2- Eat more vegetables- buy more frozen vegetables, cook more vegetables. Do you need recipes??  Google recipes....  3- do some type of exercise on days off.  Butch Penny [phone]

## 2018-11-11 NOTE — Progress Notes (Signed)
Medical Nutrition Therapy:  Appt start time: 0830 end time:  0915. Total time: 45 Visit # 1  11/11/2018 Christy Burke [id number]  Assessment:  Primary concerns today: pre-diabetes. Christy Burke is here for education and support for her prediabetes. She recently cut back on her regular soda and Gatorade and stopped smoking on 11/04/2018.  Her increase in her A1C coincides with her  increase in weight from 185# to her current weight. She asks appropriate questions about her pre-diabetes and health. She works 3-4 12 hour days per week and "lays around" on hr days off. She shares in the shopping and cooking of food in her house.   Preferred Learning Style: No preference indicated  Learning Readiness: Change in progress  ANTHROPOMETRICS: Estimated body mass index is 31.64 kg/m as calculated from the following:   Height as of 11/05/18: 5\' 6"  (1.676 m).   Weight as of 11/05/18: 196 lb (88.9 kg).  WEIGHT HISTORY:  Wt Readings from Last 5 Encounters:  11/05/18 196 lb (88.9 kg)  11/05/18 199 lb 1.6 oz (90.3 kg)  07/02/18 197 lb (89.4 kg)  11/25/17 184 lb (83.5 kg)  07/26/17 177 lb 3.2 oz (80.4 kg)   SLEEP:"good"  MEDICATIONS:  Current Outpatient Medications on File Prior to Visit  Medication Sig Dispense Refill  . albuterol (VENTOLIN HFA) 108 (90 Base) MCG/ACT inhaler Inhale 2 puffs into the lungs every 6 (six) hours as needed for wheezing or shortness of breath. 1 Inhaler 0  . Ascorbic Acid (VITAMIN C PO) Take 1 tablet by mouth daily. Reported on 06/13/2015    . estradiol (ESTRACE) 1 MG tablet Take 1 tablet (1 mg total) by mouth daily. 90 tablet 4  . ibuprofen (ADVIL,MOTRIN) 600 MG tablet Take 1 tablet (600 mg total) by mouth every 6 (six) hours as needed (mild pain). 20 tablet 0  . methimazole (TAPAZOLE) 5 MG tablet Take 1 tablet (5 mg total) by mouth 2 (two) times daily. 120 tablet 2  . nicotine (NICODERM CQ - DOSED IN MG/24 HOURS) 14 mg/24hr patch Place 1 patch (14 mg total) onto the skin daily.  30 patch 0  . [START ON 12/04/2018] nicotine (NICODERM CQ - DOSED IN MG/24 HR) 7 mg/24hr patch Place 1 patch (7 mg total) onto the skin daily. 30 patch 0  . propranolol (INDERAL) 10 MG tablet Take 1 tablet by mouth twice daily (Patient not taking: Reported on 11/05/2018) 180 tablet 0   No current facility-administered medications on file prior to visit.     BLOOD SUGAR:  Lab Results  Component Value Date   HGBA1C 5.7 (A) 11/05/2018   HGBA1C 5.8 09/27/2015   HGBA1C 5.5 09/07/2014   HGBA1C 5.3 05/23/2012     DIETARY INTAKE: Usual eating pattern includes 3 meals and ? snacks per day.  Avoided foods include avocados, pork and beef.  Food Intolerances: lactose Dining Out (times/week): daily 24-hr recall:  B ( 9AM): [employer] egg and sausage biscuit L ( 1 PM): frozen dinner she brings from home D ( PM): chicken, fish Beverages: just cut down on regular soda and gatorade  Usual physical activity: work  Estimated energy needs: 1800 calories  Progress Towards Goal(s):  In progress.   Nutritional Diagnosis:  NB-1.1 Food and nutrition-related knowledge deficit As related to lack of previous lifestyle training for pre-diabetes.  As evidenced by her report and questions. .    Intervention:  Nutrition education about prediabetes and lifestyle changes to reduce her risk of developing diabetes. Action Goal:  increase vegetable and water intake, more activity  Outcome goal: reduces blood sugar/weight Coordination of care: none  Teaching Method Utilized: Visual, Auditory,Hands on Handouts given during visit include:small steps and big rewards- preventing Type 2 diabetes Barriers to learning/adherence to lifestyle change: competing values Demonstrated degree of understanding via:  Teach Back   Monitoring/Evaluation:  Dietary intake, exercise, and body weight in 2 week(s)  Debera Lat, RD 11/11/2018 12:00 PM. .

## 2018-11-24 NOTE — Progress Notes (Signed)
  Medical Nutrition Therapy:  Appt start time: 0815 end time:  0910. Total time: 55 Visit # 2  11/25/2018 Marcy Siren [id number]  Assessment:  Primary concerns today: pre-diabetes- she does not want to need medicine for diabetes or blood pressure. Ms. Hirani is here for additional education and support. She drank mostly w3ater over the past two weeks and felt th for her prediabetes. She  Did not eat more vegetables or exercise on her days off. Her weight is ~ the same or increased and she is unhappy with this. She is willing to keep food records to self monitorin her intake. She thinks her partner may also want to join her in doing this.   ANTHROPOMETRICS: Estimated body mass index is 32.28 kg/m as calculated from the following:   Height as of 11/05/18: 5\' 6"  (1.676 m).   Weight as of this encounter: 200 lb (90.7 kg).  WEIGHT HISTORY:  Wt Readings from Last 5 Encounters:  11/25/18 200 lb (90.7 kg)  11/05/18 196 lb (88.9 kg)  11/05/18 199 lb 1.6 oz (90.3 kg)  07/02/18 197 lb (89.4 kg)  11/25/17 184 lb (83.5 kg)   SLEEP:"good"  BLOOD SUGAR:  Lab Results  Component Value Date   HGBA1C 5.7 (A) 11/05/2018   HGBA1C 5.8 09/27/2015   HGBA1C 5.5 09/07/2014   HGBA1C 5.3 05/23/2012    DIETARY INTAKE: Usual eating pattern includes 3 meals and 0-2 snacks per day.  Avoided foods include avocados, pork and beef.  Food Intolerances: lactose Dining Out (times/week): daily  B ( 9AM): oatmeal, egg, toast, grits and cheese, OJ Beverages: mostly water  Usual physical activity: work  Estimated energy needs: 1800 calories  Progress Towards Goal(s):  No progress.   Nutritional Diagnosis:  NB-1.1 Food and nutrition-related knowledge deficit As related to lack of previous lifestyle training for pre-diabetes.  As evidenced by her report and questions. .    Intervention:  Nutrition education about prediabetes and lifestyle changes to reduce her risk of developing diabetes. Action Goal: increase  vegetable and water intake, more activity  Outcome goal: reduces blood sugar/weight Coordination of care: none  Teaching Method Utilized: Visual, Auditory,Hands on Handouts given during visit include:food records, weight history Barriers to learning/adherence to lifestyle change: competing values Demonstrated degree of understanding via:  Teach Back   Monitoring/Evaluation:  Dietary intake, exercise, and body weight in 1 week(s)  Debera Lat, RD 11/25/2018 2:13 PM.

## 2018-11-25 ENCOUNTER — Other Ambulatory Visit: Payer: Self-pay

## 2018-11-25 ENCOUNTER — Ambulatory Visit (INDEPENDENT_AMBULATORY_CARE_PROVIDER_SITE_OTHER): Payer: BC Managed Care – PPO | Admitting: Dietician

## 2018-11-25 ENCOUNTER — Encounter: Payer: Self-pay | Admitting: Dietician

## 2018-11-25 DIAGNOSIS — Z713 Dietary counseling and surveillance: Secondary | ICD-10-CM

## 2018-11-25 DIAGNOSIS — R7303 Prediabetes: Secondary | ICD-10-CM | POA: Diagnosis not present

## 2018-11-25 DIAGNOSIS — Z6832 Body mass index (BMI) 32.0-32.9, adult: Secondary | ICD-10-CM

## 2018-11-25 NOTE — Patient Instructions (Signed)
Writing down what we eat and drink can help [place] to see it in a different way. It may help [place] to change habits. What we eat and drink are habits formed during our lifetime. Habits are learned and can be unlearned and changed.   Please write down the foods and beverages you have during the day. Please include at least the time you eat and drink, what you eat and drink and how much you eat and drink. Includes as much detail as you can.  For example:  Time   What    How much 8 AM  Cooked oatmeal   1 Cup   Coffee     1 mug                         Sugar in oatmeal   1 teaspoon         Sugar in coffee   2 teaspoons   Milk in oatmeal   1/4 cup   Creamer in coffee    2 teaspoons   Margarine in oatmeal   1 teaspoon

## 2018-12-01 IMAGING — MG DIGITAL SCREENING BILATERAL MAMMOGRAM WITH TOMO AND CAD
8 series · 8 of 24 positions shown · non-contrast
Comparison: Previous exam(s).

CLINICAL DATA: Screening.

EXAM:
DIGITAL SCREENING BILATERAL MAMMOGRAM WITH TOMO AND CAD

[R CC synth-2D]
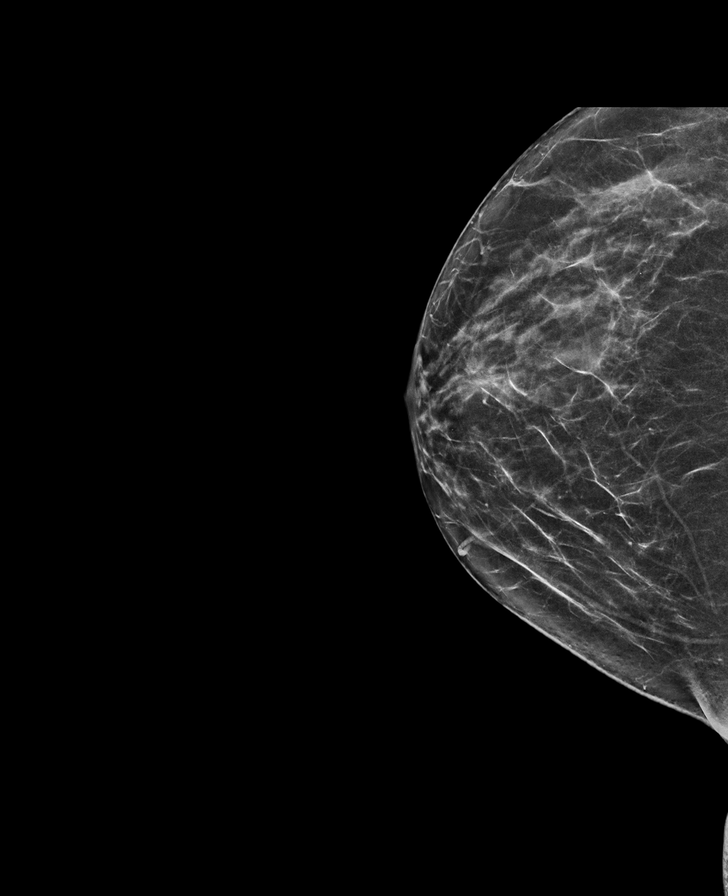

[R MLO synth-2D]
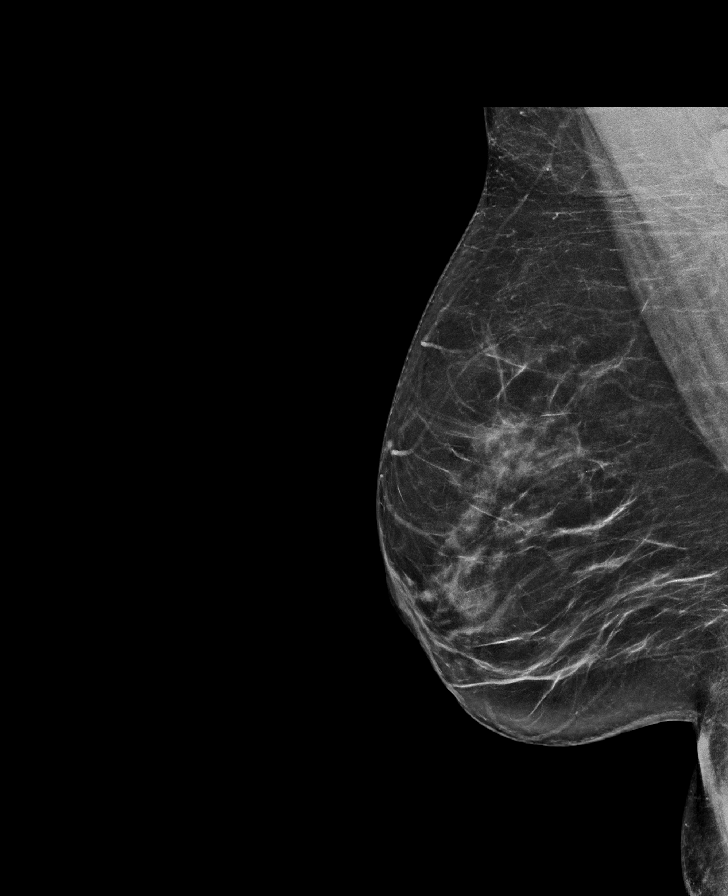

[L MLO synth-2D]
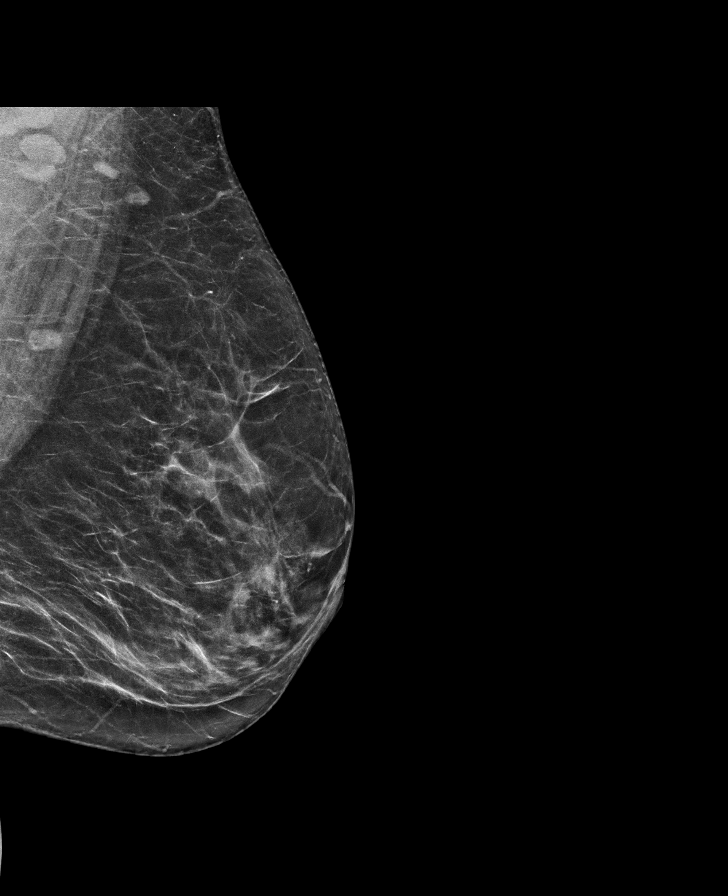

[L CC synth-2D]
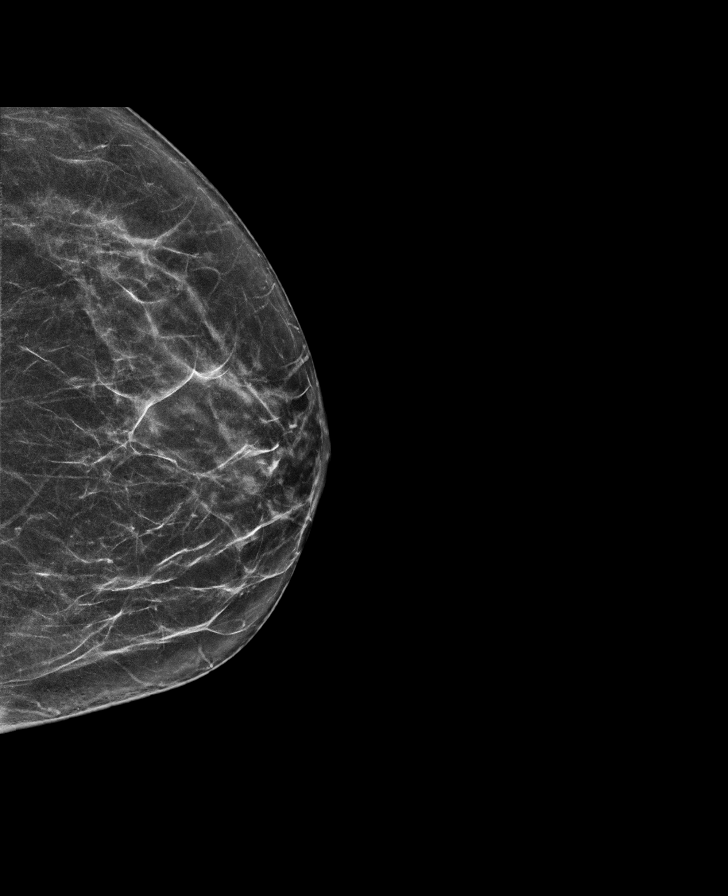

[L CC tomo · tomo slice 33/64.0]
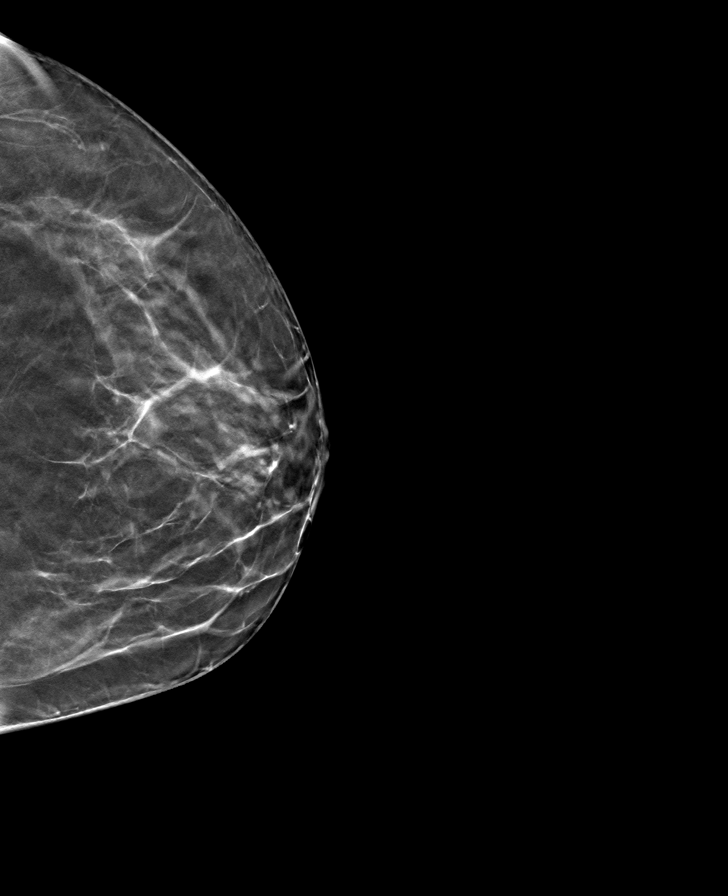

[L MLO tomo · tomo slice 36/71.0]
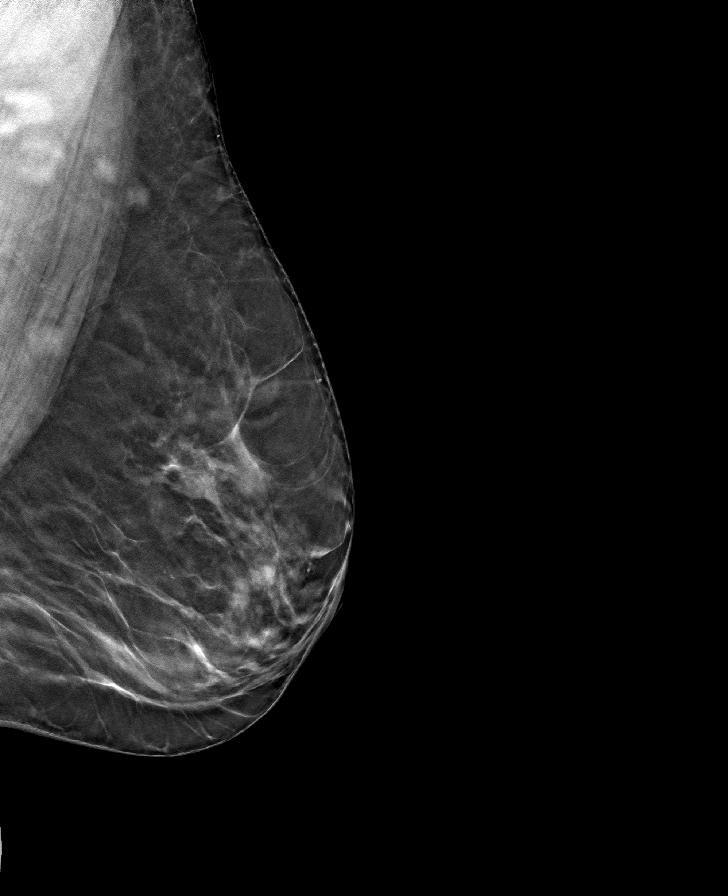

[R CC tomo · tomo slice 33/64.0]
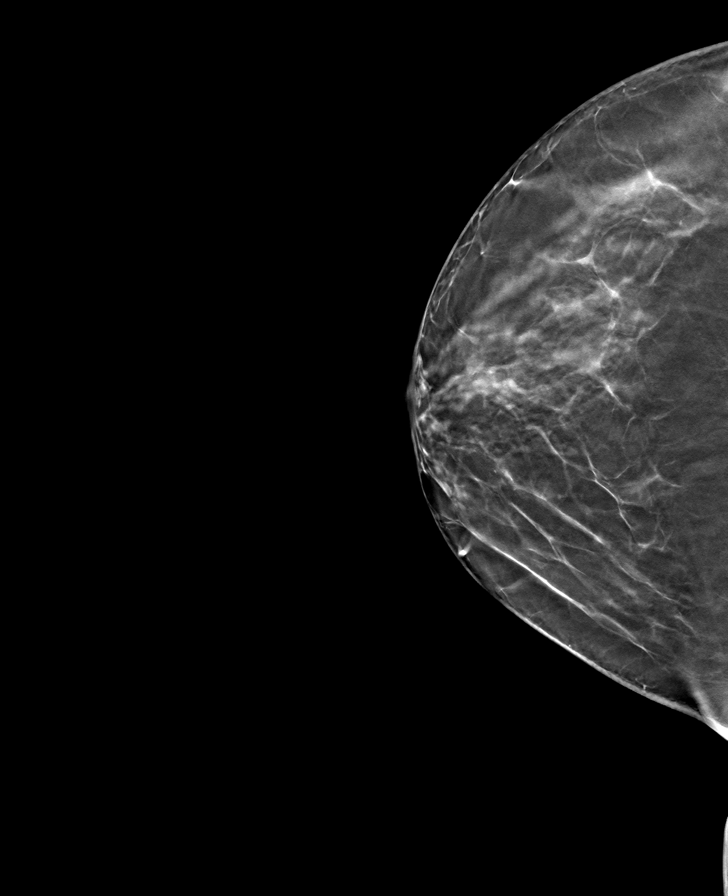

[R MLO tomo · tomo slice 39/76.0]
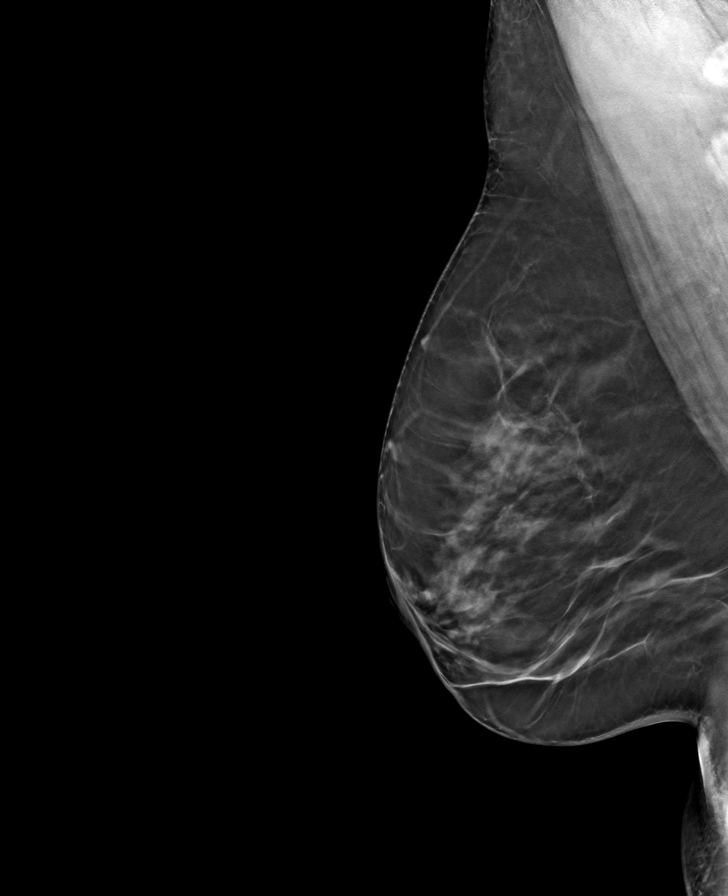

[8 of 24 positions shown; findings below may reference images not displayed]

ACR Breast Density Category b: There are scattered areas of
fibroglandular density.
FINDINGS: There are no findings suspicious for malignancy. Images were
processed with CAD.
IMPRESSION: No mammographic evidence of malignancy. A result letter of this
screening mammogram will be mailed directly to the patient.

RECOMMENDATION:
Screening mammogram in one year. (Code:[id number])

BI-RADS CATEGORY  1: Negative.

## 2018-12-02 ENCOUNTER — Ambulatory Visit: Payer: BC Managed Care – PPO | Admitting: Dietician

## 2018-12-03 ENCOUNTER — Ambulatory Visit: Payer: BC Managed Care – PPO | Admitting: Dietician

## 2019-03-11 ENCOUNTER — Other Ambulatory Visit: Payer: Self-pay

## 2019-03-11 ENCOUNTER — Ambulatory Visit: Payer: BC Managed Care – PPO | Admitting: Internal Medicine

## 2019-03-11 ENCOUNTER — Encounter: Payer: Self-pay | Admitting: Internal Medicine

## 2019-03-11 VITALS — BP 120/60 | HR 71 | Ht 66.0 in | Wt 192.0 lb

## 2019-03-11 DIAGNOSIS — E0500 Thyrotoxicosis with diffuse goiter without thyrotoxic crisis or storm: Secondary | ICD-10-CM

## 2019-03-11 DIAGNOSIS — E669 Obesity, unspecified: Secondary | ICD-10-CM

## 2019-03-11 LAB — TSH: TSH: 2.66 u[IU]/mL (ref 0.35–4.50)

## 2019-03-11 LAB — T3, FREE: T3, Free: 2.9 pg/mL (ref 2.3–4.2)

## 2019-03-11 LAB — T4, FREE: Free T4: 0.65 ng/dL (ref 0.60–1.60)

## 2019-03-11 NOTE — Progress Notes (Signed)
Patient ID: Christy Burke, female   DOB: 10/04/1960, 59 y.o.   MRN: [id number]   This visit occurred during the SARS-CoV-2 public health emergency.  Safety protocols were in place, including screening questions prior to the visit, additional usage of staff PPE, and extensive cleaning of exam room while observing appropriate contact time as indicated for disinfecting solutions.   HPI  Christy Burke is a 59 y.o.-year-old female, returning for follow-up for Graves' disease and Graves' ophthalmopathy.  Last visit 4 months ago.  She had L knee swelling ~ 1 week ago >> resolved. She also had L arm numbness for last week >> intermittent.  Since last visit, she started to work with a nutritionist.  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 Labs remained abnormal 2 weeks later.  At that time, she was started on: - Methimazole 10 mg 3x a day - Propranolol 10 mg 2x a day  Retrospectively, she did have: - hot flushes (but had a hysterectomy) - L eye bulging - some blurry vision, but not diplopia, chemosis, pain - weight loss: 20 lbs - in last 4 mo - lack of appetite - tremors - palpitations - better on beta blockers - no hyperdefecation - no insomnia - + anxiety  She was started on methimazole 10 mg 3x a day and Inderal 10 mg 2x a day.  The above symptoms resolved, except for hot flashes, for which she is on HRT.  She did gain weight, however.  I tried to change her methimazole dose between appointments, but she was not compliant with the medication changes and labs... When she returned for last visit in 06/2018, she was on 10 mg twice a day.  In 07/2018, her TSH was elevated, so I advised her to reduce the methimazole dose to 5 mg twice a day and come back for labs in 1.5 months.  She did not return...  At last visit, her TFTs were normal, so we continued MMI 5 mg 2x a day. Off beta  blocker.  Reviewed her TFTs: Lab Results  Component Value Date   TSH 1.32 11/05/2018   TSH 11.77 (H) 07/16/2018   TSH 0.59 11/25/2017   TSH <0.01 Repeated and verified X2. (L) 07/26/2017   TSH <0.006 (L) 04/15/2017   TSH <0.010 (L) 04/02/2017   FREET4 0.77 11/05/2018   FREET4 0.50 (L) 07/16/2018   FREET4 0.63 11/25/2017   FREET4 1.10 07/26/2017   FREET4 3.04 (H) 04/15/2017   FREET4 2.34 (H) 04/02/2017   T3FREE 3.0 11/05/2018   T3FREE 2.9 07/16/2018   T3FREE 3.0 11/25/2017   T3FREE 3.6 07/26/2017   Her thyroid antibodies were elevated (TRAb and TSI) indicating Graves ds.: Component     Latest Ref Rng & Units 04/15/2017  Thyrotropin Receptor Ab     0.00 - 1.75 IU/L 12.16 (H)   Lab Results  Component Value Date   TSI 319 (H) 07/26/2017   Pt denies: - feeling nodules in neck - hoarseness - dysphagia - choking - SOB with lying down  Pt does not have a FH of thyroid ds. No FH of thyroid cancer. No h/o radiation tx to head or neck.  No recent contrast studies. No herbal supplements. No Biotin use. No recent steroids use.   Pt. also has a history of kidney stones in 2012, 2014, 2016, 2018.  She has a history of lithotripsy.  She saw her  ophthalmologist in the past and I reviewed the report: there was no evidence of Graves' ophthalmopathy.  She continues to follow with them yearly.  Right before the visit, she had a stressful conversation with her granddaughter.  She feels that this is why her blood pressure is high today.  ROS: Constitutional: no weight gain/+ weight loss, no fatigue, no subjective hyperthermia, no subjective hypothermia Eyes: no blurry vision, no xerophthalmia ENT: no sore throat, + see HPI Cardiovascular: no CP/no SOB/no palpitations/no leg swelling Respiratory: no cough/no SOB/no wheezing Gastrointestinal: no N/no V/no D/no C/no acid reflux Musculoskeletal: no muscle aches/no joint aches Skin: no rashes, no hair loss Neurological: no tremors/+ left  arm intermittent numbness/no tingling/no dizziness  I reviewed pt's medications, allergies, PMH, social hx, family hx, and changes were documented in the history of present illness. Otherwise, unchanged from my initial visit note.  Past Medical History:  Diagnosis Date  . Asthma   . History of umbilical hernia   . Renal stones    Past Surgical History:  Procedure Laterality Date  . lithotrypsy    . UMBILICAL HERNIA REPAIR  1990   Social History   Socioeconomic History  . Marital status: Married    Spouse name: Not on file  . Number of children: 28: 78 year old in 2019  . Years of education: Not on file  . Highest education level: Not on file  Occupational History  .  Transport planner  Social Needs  . Financial resource strain: Not on file  . Food insecurity:    Worry: Not on file    Inability: Not on file  . Transportation needs:    Medical: Not on file    Non-medical: Not on file  Tobacco Use  . Smoking status: Current Every Day Smoker    Packs/day: 1.00    Years: 39.00    Pack years: 39.00    Types: Cigarettes  . Smokeless tobacco: Never Used  . Tobacco comment: wants Nicotine gum. cutting back  Substance and Sexual Activity  . 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 Drug use: No    Types: Cocaine    Comment: Quit several years ago, never did IVDU only smoked cocaine  . Sexual activity: Yes    Birth control/protection: Other-see comments    Comment: In same sex relationship for 19 years   Current Outpatient Medications on File Prior to Visit  Medication Sig Dispense Refill  . albuterol (VENTOLIN HFA) 108 (90 Base) MCG/ACT inhaler Inhale 2 puffs into the lungs every 6 (six) hours as needed for wheezing or shortness of breath. 1 Inhaler 0  . Ascorbic Acid (VITAMIN C PO) Take 1 tablet by mouth daily. Reported on 06/13/2015    . estradiol (ESTRACE) 1 MG tablet Take 1 tablet (1 mg  total) by mouth daily. 90 tablet 4  . ibuprofen (ADVIL,MOTRIN) 600 MG tablet Take 1 tablet (600 mg total) by mouth every 6 (six) hours as needed (mild pain). 20 tablet 0  . methimazole (TAPAZOLE) 5 MG tablet Take 1 tablet (5 mg total) by mouth 2 (two) times daily. 120 tablet 2  . nicotine (NICODERM CQ - DOSED IN MG/24 HOURS) 14 mg/24hr patch Place 1 patch (14 mg total) onto the skin daily. 30 patch 0  . nicotine (NICODERM CQ - DOSED IN MG/24 HR) 7 mg/24hr patch Place 1 patch (7 mg total) onto the skin daily.  30 patch 0  . propranolol (INDERAL) 10 MG tablet Take 1 tablet by mouth twice daily (Patient not taking: Reported on 11/05/2018) 180 tablet 0   No current facility-administered medications on file prior to visit.   No Known Allergies Family History  Problem Relation Age of Onset  . Cancer Mother   . Diabetes Father   . Colon cancer Neg Hx    PE: BP 120/60   Pulse 71   Ht 5\' 6"  (1.676 m)   Wt 192 lb (87.1 kg)   LMP 03/14/2015 (Approximate)   SpO2 98%   BMI 30.99 kg/m  Wt Readings from Last 3 Encounters:  03/11/19 192 lb (87.1 kg)  11/25/18 200 lb (90.7 kg)  11/05/18 196 lb (88.9 kg)   Constitutional: overweight, in NAD Eyes: PERRLA, EOMI, no exophthalmos ENT: moist mucous membranes, + thyromegaly, no cervical lymphadenopathy Cardiovascular: RRR, No MRG Respiratory: CTA B Gastrointestinal: abdomen soft, NT, ND, BS+ Musculoskeletal: no deformities, strength intact in all 4 Skin: moist, warm, no rashes Neurological: no tremor with outstretched hands, DTR normal in all 4  ASSESSMENT: 1. Graves ds.   2.  Graves' ophthalmopathy  3.  Weight gain  PLAN:  1. Patient with history of enlarged thyroid discovered by patient when she was looking in the mirror in 03/2017.  Around that time, she was told that her left eye was mildly bulging.  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  Labs remained abnormal 2 weeks later and she was also found to have high thyrotropin  receptor antibody titer.  This confirms Graves' disease.  She was started on methimazole 10 mg 3 times a day and Inderal 10 mg twice a day.  Retrospectively, she also noticed weight loss, heat intolerance, palpitations, tremors, increased appetite, anxiety.  Symptoms resolved after starting the above medications.  However, she was noncompliant with thyroid hormone checks and we could not adjust her dose of methimazole in a timely fashion.  At last visit she was on 5 mg twice a day and we continued the same dose since her tests actually returned to normal. -No complaints at this visit, no hyperthyroid symptoms -We will recheck her TFTs today along with TSI antibodies -We discussed that we will change the methimazole dose depending on the labs and may need to repeat the test in 6 weeks after any methimazole dose changes -I will see her back in 4 months but hopefully sooner for labs  2.  Graves' ophthalmopathy -No chemosis, diplopia, pain with eye movement -Her TSI's were elevated in the past and we will repeat them today -She will continue to follow on a yearly basis with ophthalmology.  I advised her to let her ophthalmologist know about the most recent TSI titer when she goes back to see them.  3.  Weight gain -She lost 4 pounds since last visit. -Overall, she gained a significant amount of weight after resolution of her Graves' disease (23 pounds in the last year) -We again discussed that if we overtreat her hyperthyroidism (for example by staying on methimazole dose too long), this is conducive to weight gain -We will recheck her TFTs today  Needs refills.   Component     Latest Ref Rng & Units 03/11/2019  TSH     0.35 - 4.50 uIU/mL 2.66  T4,Free(Direct)     0.60 - 1.60 ng/dL 0.65  Triiodothyronine,Free,Serum     2.3 - 4.2 pg/mL 2.9  TSI     <140 % baseline 236 (H)  TFTs are normal >> will try to decrease methimazole to 5 mg in am and 2.5 mg in pm and recheck her TFTs in 1.5  mo. TSI's are still high, but decreased.   Philemon Kingdom, MD PhD [facility]

## 2019-03-11 NOTE — Patient Instructions (Signed)
Please continue methimazole 5 mg twice a day for now.   Please come back for a follow-up appointment in 4 months, but likely sooner for labs.

## 2019-03-16 LAB — THYROID STIMULATING IMMUNOGLOBULIN: TSI: 236 % baseline — ABNORMAL HIGH (ref <140)

## 2019-03-16 MED ORDER — METHIMAZOLE 5 MG PO TABS: ORAL_TABLET | ORAL | 1 refills | Status: DC

## 2019-03-26 ENCOUNTER — Other Ambulatory Visit: Payer: Self-pay | Admitting: Internal Medicine

## 2019-03-26 ENCOUNTER — Telehealth: Payer: Self-pay | Admitting: *Deleted

## 2019-03-26 DIAGNOSIS — Z1231 Encounter for screening mammogram for malignant neoplasm of breast: Secondary | ICD-10-CM

## 2019-03-26 NOTE — Telephone Encounter (Signed)
Sounds good

## 2019-03-26 NOTE — Telephone Encounter (Signed)
[facility] called for an attendings name on mammo order, gave them dr Dareen Piano, sorry I should have put dr Philipp Ovens on it

## 2019-04-08 ENCOUNTER — Ambulatory Visit (INDEPENDENT_AMBULATORY_CARE_PROVIDER_SITE_OTHER): Payer: BC Managed Care – PPO | Admitting: Internal Medicine

## 2019-04-08 ENCOUNTER — Encounter: Payer: Self-pay | Admitting: Internal Medicine

## 2019-04-08 ENCOUNTER — Other Ambulatory Visit: Payer: Self-pay

## 2019-04-08 VITALS — BP 143/88 | HR 70 | Temp 98.2°F | Ht 66.0 in | Wt 194.9 lb

## 2019-04-08 DIAGNOSIS — I10 Essential (primary) hypertension: Secondary | ICD-10-CM

## 2019-04-08 DIAGNOSIS — R7303 Prediabetes: Secondary | ICD-10-CM

## 2019-04-08 DIAGNOSIS — Z720 Tobacco use: Secondary | ICD-10-CM | POA: Diagnosis not present

## 2019-04-08 DIAGNOSIS — R202 Paresthesia of skin: Secondary | ICD-10-CM

## 2019-04-08 LAB — POCT GLYCOSYLATED HEMOGLOBIN (HGB A1C): Hemoglobin A1C: 5.9 % — AB (ref 4.0–5.6)

## 2019-04-08 LAB — GLUCOSE, CAPILLARY: Glucose-Capillary: 94 mg/dL (ref 70–99)

## 2019-04-08 MED ORDER — HYDROCHLOROTHIAZIDE 12.5 MG PO TABS: 12.5000 mg | ORAL_TABLET | Freq: Every day | ORAL | 0 refills | Status: DC

## 2019-04-08 NOTE — Patient Instructions (Signed)
Ms. Wahlert,  It was a pleasure to see you. I have started a new medicine for your blood pressure called hydrochlorothiazide (HCTZ). Please take this once a day.   For your arm pain, I have ordered a nerve conduction test. You will be called to schedule this.   Follow up with me again in 3 months. If you have any questions or concerns, call our clinic at [phone] or after hours call [phone] and ask for the internal medicine resident on call. Thank you!  Dr. Philipp Ovens

## 2019-04-08 NOTE — Progress Notes (Signed)
Subjective:   Patient ID: Christy Burke female   DOB: 11/03/1960 59 y.o.   MRN: [id number]  HPI: [name] is a 59 y.o. female with past medical history outlined below here for BP follow up. For the details of today's visit, please refer to the assessment and plan.   Past Medical History:  Diagnosis Date  . Asthma   . History of umbilical hernia   . Renal stones    Current Outpatient Medications  Medication Sig Dispense Refill  . albuterol (VENTOLIN HFA) 108 (90 Base) MCG/ACT inhaler Inhale 2 puffs into the lungs every 6 (six) hours as needed for wheezing or shortness of breath. 1 Inhaler 0  . Ascorbic Acid (VITAMIN C PO) Take 1 tablet by mouth daily. Reported on 06/13/2015    . ELDERBERRY PO Take by mouth.    . estradiol (ESTRACE) 1 MG tablet Take 1 tablet (1 mg total) by mouth daily. 90 tablet 4  . hydrochlorothiazide (HYDRODIURIL) 12.5 MG tablet Take 1 tablet (12.5 mg total) by mouth daily. 90 tablet 0  . ibuprofen (ADVIL,MOTRIN) 600 MG tablet Take 1 tablet (600 mg total) by mouth every 6 (six) hours as needed (mild pain). 20 tablet 0  . methimazole (TAPAZOLE) 5 MG tablet Take by mouth 5 mg in am and 2.5 mg in pm 150 tablet 1  . nicotine (NICODERM CQ - DOSED IN MG/24 HOURS) 14 mg/24hr patch Place 1 patch (14 mg total) onto the skin daily. 30 patch 0  . nicotine (NICODERM CQ - DOSED IN MG/24 HR) 7 mg/24hr patch Place 1 patch (7 mg total) onto the skin daily. 30 patch 0  . propranolol (INDERAL) 10 MG tablet Take 1 tablet by mouth twice daily 180 tablet 0   No current facility-administered medications for this visit.   Family History  Problem Relation Age of Onset  . Cancer Mother   . Diabetes Father   . Colon cancer Neg Hx    Social History   Socioeconomic History  . Marital status: Married    Spouse name: Not on file  . Number of children: Not on file  . Years of education: Not on file  . Highest education level: Not on file  Occupational History  . Not on file   Tobacco Use  . Smoking status: Current Some Day Smoker    Packs/day: 1.00    Years: 39.00    Pack years: 39.00    Types: Cigarettes  . Smokeless tobacco: Never Used  . Tobacco comment: Sometimes less.  Substance and Sexual Activity  . Alcohol use: No    Alcohol/week: 0.0 standard drinks    Comment: Quit several years ago;recovering alcoholic; no alcohol in 5 years  . Drug use: No    Types: Cocaine    Comment: Quit several years ago, never did IVDU only smoked cocaine  . Sexual activity: Yes    Birth control/protection: Other-see comments    Comment: In same sex relationship for 19 years  Other Topics Concern  . Not on file  Social History Narrative  . Not on file   Social Determinants of Health   Financial Resource Strain:   . Difficulty of Paying Living Expenses: Not on file  Food Insecurity:   . Worried About Charity fundraiser in the Last Year: Not on file  . Ran Out of Food in the Last Year: Not on file  Transportation Needs:   . Lack of Transportation (Medical): Not on file  . Lack of Transportation (  Non-Medical): Not on file  Physical Activity:   . Days of Exercise per Week: Not on file  . Minutes of Exercise per Session: Not on file  Stress:   . Feeling of Stress : Not on file  Social Connections:   . Frequency of Communication with Friends and Family: Not on file  . Frequency of Social Gatherings with Friends and Family: Not on file  . Attends Religious Services: Not on file  . Active Member of Clubs or Organizations: Not on file  . Attends Archivist Meetings: Not on file  . Marital Status: Not on file    Review of Systems: Review of Systems  Respiratory: Negative for shortness of breath.   Cardiovascular: Negative for chest pain and palpitations.  Neurological: Positive for sensory change.     Objective:  Physical Exam:  Vitals:   04/08/19 0911  BP: (!) 143/88  Pulse: 70  Temp: 98.2 F (36.8 C)  TempSrc: Oral  SpO2: 100%  Weight:  194 lb 14.4 oz (88.4 kg)  Height: 5\' 6"  (1.676 m)    Physical Exam  Constitutional: She is oriented to person, place, and time and well-developed, well-nourished, and in no distress. No distress.  HENT:  Head: Normocephalic and atraumatic.  Cardiovascular: Normal rate, regular rhythm and normal heart sounds.  Pulmonary/Chest: Effort normal and breath sounds normal. No respiratory distress.  Neurological: She is alert and oriented to person, place, and time.  Skin: She is not diaphoretic.     Assessment & Plan:   See Encounters Tab for problem based charting.

## 2019-04-09 ENCOUNTER — Encounter: Payer: Self-pay | Admitting: Internal Medicine

## 2019-04-09 LAB — BMP8+ANION GAP
Anion Gap: 15 mmol/L (ref 10.0–18.0)
BUN/Creatinine Ratio: 16 (ref 9–23)
BUN: 14 mg/dL (ref 6–24)
CO2: 21 mmol/L (ref 20–29)
Calcium: 9.6 mg/dL (ref 8.7–10.2)
Chloride: 104 mmol/L (ref 96–106)
Creatinine, Ser: 0.89 mg/dL (ref 0.57–1.00)
GFR calc Af Amer: 84 mL/min/{1.73_m2} (ref >59)
GFR calc non Af Amer: 73 mL/min/{1.73_m2} (ref >59)
Glucose: 85 mg/dL (ref 65–99)
Potassium: 4 mmol/L (ref 3.5–5.2)
Sodium: 140 mmol/L (ref 134–144)

## 2019-04-09 NOTE — Assessment & Plan Note (Signed)
Hgb A1c today is again in the pre diabetic range. We discussed implications and increased risk of developing diabetes in the future. Advised lifestyle medications. Will continue to monitor.

## 2019-04-09 NOTE — Assessment & Plan Note (Signed)
BP is persistently elevated today, 143/88. Unfortunately she has continued to gain weight since her last visit. She admits that she has not been trying to diet or lose weight. During the pandemic she has been eating more than usual. She is reluctant to start an antihypertensive medication. We discussed if she is able to lose weight we may be able to come off the medication in the future. She is agreeable. BMP checked today WNL. Will start HCTZ 12.5 mg. Follow up 3 months.

## 2019-04-09 NOTE — Assessment & Plan Note (Signed)
Patient is complaining of persistent paresthesias in her left forearm and fingers. 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 Will proceed with nerve conduction testing for further work up. She will likely need referral to ortho pending results.

## 2019-04-16 NOTE — Addendum Note (Signed)
Addended by: Jodean Lima on: 04/16/2019 09:48 AM   Modules accepted: Orders

## 2019-04-20 ENCOUNTER — Other Ambulatory Visit: Payer: Self-pay

## 2019-04-20 DIAGNOSIS — R202 Paresthesia of skin: Secondary | ICD-10-CM

## 2019-05-01 ENCOUNTER — Ambulatory Visit
Admission: RE | Admit: 2019-05-01 | Discharge: 2019-05-01 | Disposition: A | Discharge status: Home / Self Care | Payer: BC Managed Care – PPO | Source: Ambulatory Visit | Attending: Internal Medicine | Admitting: Internal Medicine

## 2019-05-01 ENCOUNTER — Other Ambulatory Visit: Payer: Self-pay

## 2019-05-01 DIAGNOSIS — Z1231 Encounter for screening mammogram for malignant neoplasm of breast: Secondary | ICD-10-CM

## 2019-05-06 ENCOUNTER — Encounter: Payer: BC Managed Care – PPO | Admitting: Neurology

## 2019-05-20 ENCOUNTER — Encounter: Payer: BC Managed Care – PPO | Admitting: Neurology

## 2019-05-20 NOTE — Addendum Note (Signed)
Addended by: Hulan Fray on: 05/20/2019 05:32 PM   Modules accepted: Orders

## 2019-07-02 ENCOUNTER — Other Ambulatory Visit: Payer: Self-pay | Admitting: Internal Medicine

## 2019-07-10 ENCOUNTER — Encounter: Payer: Self-pay | Admitting: Internal Medicine

## 2019-07-10 ENCOUNTER — Ambulatory Visit: Payer: BC Managed Care – PPO | Admitting: Internal Medicine

## 2019-07-10 ENCOUNTER — Other Ambulatory Visit: Payer: Self-pay

## 2019-07-10 VITALS — BP 126/80 | HR 81 | Ht 66.0 in | Wt 195.0 lb

## 2019-07-10 DIAGNOSIS — E0500 Thyrotoxicosis with diffuse goiter without thyrotoxic crisis or storm: Secondary | ICD-10-CM | POA: Diagnosis not present

## 2019-07-10 LAB — T3, FREE: T3, Free: 3.1 pg/mL (ref 2.3–4.2)

## 2019-07-10 LAB — T4, FREE: Free T4: 0.8 ng/dL (ref 0.60–1.60)

## 2019-07-10 LAB — TSH: TSH: 0.91 u[IU]/mL (ref 0.35–4.50)

## 2019-07-10 MED ORDER — METHIMAZOLE 5 MG PO TABS: ORAL_TABLET | ORAL | 3 refills | Status: DC

## 2019-07-10 NOTE — Progress Notes (Signed)
Patient ID: Christy Burke, female   DOB: 13-Jul-1960, 59 y.o.   MRN: [id number]   This visit occurred during the SARS-CoV-2 public health emergency.  Safety protocols were in place, including screening questions prior to the visit, additional usage of staff PPE, and extensive cleaning of exam room while observing appropriate contact time as indicated for disinfecting solutions.   HPI  Christy Burke is a 59 y.o.-year-old female, returning for follow-up for Graves' disease and Graves' ophthalmopathy.  Last visit 4 months ago.  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 Labs remained abnormal 2 weeks later.  At that time, she was started on: - Methimazole 10 mg 3x a day - Propranolol 10 mg 2x a day  Retrospectively, she did have: - hot flushes (but had a hysterectomy) - L eye bulging - some blurry vision, but not diplopia, chemosis, pain - weight loss: 20 lbs - in last 4 mo - lack of appetite - tremors - palpitations - better on beta blockers - no hyperdefecation - no insomnia - + anxiety  She was initially started on methimazole 10 mg 3 times a day and Inderal 10 mg twice a day.  The symptoms resolved, except for hot flashes, for which she is on HRT.  She did gain, however after she started the medications.  We subsequently decrease the dose of methimazole and stopped Inderal.  However, she is not returning for labs between appointments despite repeated advice to do so.   In 07/2018, her TSH was elevated, so I advised her to reduce the methimazole dose to 5 mg twice a day and come back for labs in 1.5 months.  She did not return... At last visit, we decreased her methimazole dose further to 5 mg a.m. and 2.5 mg in p.m.  She did not return for labs...  At this visit she tells me that she ran out of methimazole 5 days ago...  Reviewed her TFTs: Lab Results  Component Value Date    TSH 2.66 03/11/2019   TSH 1.32 11/05/2018   TSH 11.77 (H) 07/16/2018   TSH 0.59 11/25/2017   TSH <0.01 Repeated and verified X2. (L) 07/26/2017   TSH <0.006 (L) 04/15/2017   TSH <0.010 (L) 04/02/2017   FREET4 0.65 03/11/2019   FREET4 0.77 11/05/2018   FREET4 0.50 (L) 07/16/2018   FREET4 0.63 11/25/2017   FREET4 1.10 07/26/2017   FREET4 3.04 (H) 04/15/2017   FREET4 2.34 (H) 04/02/2017   T3FREE 2.9 03/11/2019   T3FREE 3.0 11/05/2018   T3FREE 2.9 07/16/2018   T3FREE 3.0 11/25/2017   T3FREE 3.6 07/26/2017   Thyroid antibodies were elevated: Component     Latest Ref Rng & Units 04/15/2017  Thyrotropin Receptor Ab     0.00 - 1.75 IU/L 12.16 (H)   Lab Results  Component Value Date   TSI 236 (H) 03/11/2019   TSI 319 (H) 07/26/2017   Pt denies: - feeling nodules in neck - hoarseness - dysphagia - choking - SOB with lying down  Pt does have a FH of thyroid ds.: 2nd cousin and niece with Graves ds.  No FH of thyroid cancer. No h/o radiation tx to head or neck.  No seaweed or kelp. No recent contrast studies. No herbal supplements. No Biotin use. No recent steroids use.   Pt. also has a history of kidney stones in 2012, 2014, 2016, 2018.  She has  a history of lithotripsy.  She saw her ophthalmologist in the past and I reviewed the report: there was no evidence of Graves' ophthalmopathy.  She continues to follow with them yearly.  ROS: Constitutional: no weight gain/no weight loss, no fatigue, no subjective hyperthermia, no subjective hypothermia Eyes: no blurry vision, no xerophthalmia ENT: no sore throat, no nodules palpated in neck, no dysphagia, no odynophagia, no hoarseness Cardiovascular: no CP/no SOB/no palpitations/no leg swelling Respiratory: no cough/no SOB/no wheezing Gastrointestinal: no N/no V/no D/no C/no acid reflux Musculoskeletal: no muscle aches/no joint aches Skin: no rashes, no hair loss Neurological: no tremors/+ left arm intermittent numbness/no  tingling/no dizziness  I reviewed pt's medications, allergies, PMH, social hx, family hx, and changes were documented in the history of present illness. Otherwise, unchanged from my initial visit note.  Past Medical History:  Diagnosis Date  . Asthma   . History of umbilical hernia   . Renal stones    Past Surgical History:  Procedure Laterality Date  . lithotrypsy    . UMBILICAL HERNIA REPAIR  1990   Social History   Socioeconomic History  . Marital status: Married    Spouse name: Not on file  . Number of children: 55: 37 year old in 2019  . Years of education: Not on file  . Highest education level: Not on file  Occupational History  .  Transport planner  Social Needs  . Financial resource strain: Not on file  . Food insecurity:    Worry: Not on file    Inability: Not on file  . Transportation needs:    Medical: Not on file    Non-medical: Not on file  Tobacco Use  . Smoking status: Current Every Day Smoker    Packs/day: 1.00    Years: 39.00    Pack years: 39.00    Types: Cigarettes  . Smokeless tobacco: Never Used  . Tobacco comment: wants Nicotine gum. cutting back  Substance and Sexual Activity  . 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 Drug use: No    Types: Cocaine    Comment: Quit several years ago, never did IVDU only smoked cocaine  . Sexual activity: Yes    Birth control/protection: Other-see comments    Comment: In same sex relationship for 19 years   Current Outpatient Medications on File Prior to Visit  Medication Sig Dispense Refill  . albuterol (VENTOLIN HFA) 108 (90 Base) MCG/ACT inhaler Inhale 2 puffs into the lungs every 6 (six) hours as needed for wheezing or shortness of breath. 1 Inhaler 0  . Ascorbic Acid (VITAMIN C PO) Take 1 tablet by mouth daily. Reported on 06/13/2015    . ELDERBERRY PO Take by mouth.    . estradiol (ESTRACE) 1 MG tablet Take 1 tablet (1  mg total) by mouth daily. 90 tablet 4  . hydrochlorothiazide (HYDRODIURIL) 12.5 MG tablet Take 1 tablet (12.5 mg total) by mouth daily. 90 tablet 0  . ibuprofen (ADVIL,MOTRIN) 600 MG tablet Take 1 tablet (600 mg total) by mouth every 6 (six) hours as needed (mild pain). 20 tablet 0  . methimazole (TAPAZOLE) 5 MG tablet Take by mouth 5 mg in am and 2.5 mg in pm 150 tablet 1  . nicotine (NICODERM CQ - DOSED IN MG/24 HOURS) 14 mg/24hr patch Place 1 patch (14 mg total) onto the skin daily. 30 patch 0  . nicotine (NICODERM CQ -  DOSED IN MG/24 HR) 7 mg/24hr patch Place 1 patch (7 mg total) onto the skin daily. 30 patch 0  . propranolol (INDERAL) 10 MG tablet Take 1 tablet by mouth twice daily 180 tablet 0   No current facility-administered medications on file prior to visit.   No Known Allergies Family History  Problem Relation Age of Onset  . Cancer Mother   . Diabetes Father   . Colon cancer Neg Hx    PE: BP 126/80   Pulse 81   Ht 5\' 6"  (1.676 m)   Wt 195 lb (88.5 kg)   LMP 03/14/2015 (Approximate)   SpO2 98%   BMI 31.47 kg/m  Wt Readings from Last 3 Encounters:  07/10/19 195 lb (88.5 kg)  04/08/19 194 lb 14.4 oz (88.4 kg)  03/11/19 192 lb (87.1 kg)   Constitutional: overweight, in NAD Eyes: PERRLA, EOMI, no exophthalmos ENT: moist mucous membranes, + thyromegaly, no cervical lymphadenopathy Cardiovascular: RRR, No MRG Respiratory: CTA B Gastrointestinal: abdomen soft, NT, ND, BS+ Musculoskeletal: no deformities, strength intact in all 4 Skin: moist, warm, no rashes Neurological: no tremor with outstretched hands, DTR normal in all 4  ASSESSMENT: 1. Graves ds.   2.  Graves' ophthalmopathy  PLAN:  1. Patient with history of enlarged thyroid discovered by patient when she was looking in the mirror in 03/2017.  Around that time, she was told that her left eye was mildly bulging.  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  Labs remain abnormal 2 weeks later and she was  also found to have high thyrotropin receptor antibody titer.  This confirmed Graves' disease.  She was started on methimazole 10 mg 3 times a day and Inderal 10 mg twice a day.  Retrospectively, she also noticed weight loss, heat intolerance, palpitations, tremors, increased appetite, anxiety.  Symptoms resolved after starting the above medications.  However, she was noncompliant with thyroid hormone checks and we could not adjust the dose of methimazole in a timely fashion. At last visit we decreased the methimazole and she again did not present for labs in 1.5 months after the dose change Also, since last visit she ran out of methimazole 5 days ago.  I strongly advised her not to run out of medication again. -No hyper or hypothyroid complaints at this visit -We will repeat her TFTs today -We again discussed about the importance of coming back for labs in 5 to 6 weeks of after every methimazole dose change.  She agrees to do so. -I we will see her back in 4 months  2.  Graves' ophthalmopathy -Left eye exophthalmos has improved, not visible on today's exam -No chemosis, diplopia, pain with eye movement -Her TSI's were elevated in the past, but decreasing at last visit.  We will not repeat this today. -She will continue to follow on a yearly basis with ophthalmology.    Needs refills.  Office Visit on 07/10/2019  Component Date Value Ref Range Status  . T3, Free 07/10/2019 3.1  2.3 - 4.2 pg/mL Final  . Free T4 07/10/2019 0.80  0.60 - 1.60 ng/dL Final   Comment: Specimens from patients who are undergoing biotin therapy and /or ingesting biotin supplements may contain high levels of biotin.  The higher biotin concentration in these specimens interferes with this Free T4 assay.  Specimens that contain high levels  of biotin may cause false high results for this Free T4 assay.  Please interpret results in light of the total clinical presentation  of the patient.    Marland Kitchen TSH 07/10/2019 0.91  0.35 - 4.50  uIU/mL Final   Tests are excellent. Continue same MMI dose and recheck the tests when she returns in 4 mo.  Philemon Kingdom, MD PhD [facility] Endocrin[facility]

## 2019-07-10 NOTE — Patient Instructions (Addendum)
Please continue methimazole 5 mg in a.m. and 2.5 mg in p.m.   Please stop at the lab.  Please come back for a follow-up appointment in 4 months, but likely sooner for labs.

## 2019-08-26 ENCOUNTER — Other Ambulatory Visit: Payer: Self-pay

## 2019-08-26 ENCOUNTER — Encounter: Payer: Self-pay | Admitting: Internal Medicine

## 2019-08-26 ENCOUNTER — Ambulatory Visit (INDEPENDENT_AMBULATORY_CARE_PROVIDER_SITE_OTHER): Payer: BC Managed Care – PPO | Admitting: Internal Medicine

## 2019-08-26 VITALS — BP 138/90 | HR 76 | Temp 98.2°F | Ht 66.0 in | Wt 192.0 lb

## 2019-08-26 DIAGNOSIS — M722 Plantar fascial fibromatosis: Secondary | ICD-10-CM

## 2019-08-26 DIAGNOSIS — I10 Essential (primary) hypertension: Secondary | ICD-10-CM | POA: Diagnosis not present

## 2019-08-26 DIAGNOSIS — R202 Paresthesia of skin: Secondary | ICD-10-CM | POA: Diagnosis not present

## 2019-08-26 DIAGNOSIS — F430 Acute stress reaction: Secondary | ICD-10-CM | POA: Insufficient documentation

## 2019-08-26 MED ORDER — HYDROCHLOROTHIAZIDE 12.5 MG PO TABS: 12.5000 mg | ORAL_TABLET | Freq: Every day | ORAL | 1 refills | Status: DC

## 2019-08-26 NOTE — Progress Notes (Signed)
Subjective:   Patient ID: Christy Burke female   DOB: Mar 25, 1960 59 y.o.   MRN: [id number]  HPI: [name] is a 59 y.o. female with past medical history outlined below here for HTN follow up. For the details of today's visit, please refer to the assessment and plan.   Past Medical History:  Diagnosis Date  . Asthma   . History of umbilical hernia   . Renal stones    Current Outpatient Medications  Medication Sig Dispense Refill  . albuterol (VENTOLIN HFA) 108 (90 Base) MCG/ACT inhaler Inhale 2 puffs into the lungs every 6 (six) hours as needed for wheezing or shortness of breath. 1 Inhaler 0  . Ascorbic Acid (VITAMIN C PO) Take 1 tablet by mouth daily. Reported on 06/13/2015    . ELDERBERRY PO Take by mouth.    . estradiol (ESTRACE) 1 MG tablet Take 1 tablet (1 mg total) by mouth daily. 90 tablet 4  . hydrochlorothiazide (HYDRODIURIL) 12.5 MG tablet Take 1 tablet (12.5 mg total) by mouth daily. 90 tablet 0  . ibuprofen (ADVIL,MOTRIN) 600 MG tablet Take 1 tablet (600 mg total) by mouth every 6 (six) hours as needed (mild pain). 20 tablet 0  . methimazole (TAPAZOLE) 5 MG tablet Take by mouth 5 mg in am and 2.5 mg in pm 135 tablet 3  . nicotine (NICODERM CQ - DOSED IN MG/24 HOURS) 14 mg/24hr patch Place 1 patch (14 mg total) onto the skin daily. 30 patch 0  . nicotine (NICODERM CQ - DOSED IN MG/24 HR) 7 mg/24hr patch Place 1 patch (7 mg total) onto the skin daily. 30 patch 0  . propranolol (INDERAL) 10 MG tablet Take 1 tablet by mouth twice daily 180 tablet 0   No current facility-administered medications for this visit.   Family History  Problem Relation Age of Onset  . Cancer Mother   . Diabetes Father   . Colon cancer Neg Hx    Social History   Socioeconomic History  . Marital status: Married    Spouse name: Not on file  . Number of children: Not on file  . Years of education: Not on file  . Highest education level: Not on file  Occupational History  . Not on file    Tobacco Use  . Smoking status: Current Every Day Smoker    Packs/day: 1.00    Years: 39.00    Pack years: 39.00    Types: Cigarettes  . Smokeless tobacco: Never Used  . Tobacco comment: Sometimes less.  Substance and Sexual Activity  . Alcohol use: No    Alcohol/week: 0.0 standard drinks    Comment: Quit 9 yrs ago.  . Drug use: No    Types: Cocaine    Comment: Quit several years ago, never did IVDU only smoked cocaine  . Sexual activity: Yes    Birth control/protection: Other-see comments    Comment: In same sex relationship for 19 years  Other Topics Concern  . Not on file  Social History Narrative  . Not on file   Social Determinants of Health   Financial Resource Strain:   . Difficulty of Paying Living Expenses:   Food Insecurity:   . Worried About Charity fundraiser in the Last Year:   . Arboriculturist in the Last Year:   Transportation Needs:   . Film/video editor (Medical):   Marland Kitchen Lack of Transportation (Non-Medical):   Physical Activity:   . Days of Exercise per Week:   .  Minutes of Exercise per Session:   Stress:   . Feeling of Stress :   Social Connections:   . Frequency of Communication with Friends and Family:   . Frequency of Social Gatherings with Friends and Family:   . Attends Religious Services:   . Active Member of Clubs or Organizations:   . Attends Archivist Meetings:   Marland Kitchen Marital Status:     Review of Systems: Review of Systems  Respiratory: Negative for shortness of breath.   Cardiovascular: Negative for chest pain.     Objective:  Physical Exam:  Vitals:   08/26/19 0922  BP: 138/90  Pulse: 76  Temp: 98.2 F (36.8 C)  TempSrc: Oral  SpO2: 100%  Weight: 192 lb (87.1 kg)  Height: 5\' 6"  (1.676 m)    Physical Exam Constitutional:      Appearance: Normal appearance.  Cardiovascular:     Rate and Rhythm: Normal rate and regular rhythm.     Heart sounds: Normal heart sounds.  Pulmonary:     Effort: Pulmonary  effort is normal. No respiratory distress.     Breath sounds: Normal breath sounds.  Skin:    General: Skin is warm and dry.  Neurological:     Mental Status: She is alert.  Psychiatric:        Mood and Affect: Mood normal.        Behavior: Behavior normal.      Assessment & Plan:   See Encounters Tab for problem based charting.

## 2019-08-26 NOTE — Assessment & Plan Note (Signed)
Uncontrolled today, 138/90. Says she took the HCTZ "for awhile" but ran out weeks ago.  -- Restart HCTZ 12.5 mg daily -- Repeat BMP at follow up

## 2019-08-26 NOTE — Patient Instructions (Addendum)
Christy Burke,  It was a pleasure to see you today. I have restarted your blood pressure medication, please take this once a day.   I have placed a referral for you to meet with our clinic counselor Miquel Dunn, please schedule an appointment with her on your way out.   I have also placed a referral for a nerve conduction study; you will be called to schedule this.   Follow up with me again in 6 months. If you have any questions or concerns, call our clinic at [phone] or after hours call [phone] and ask for the internal medicine resident on call. Thank you!  Dr. Philipp Ovens     Plantar Fasciitis  Plantar fasciitis is a painful foot condition that affects the heel. It occurs when the band of tissue that connects the toes to the heel bone (plantar fascia) becomes irritated. This can happen as the result of exercising too much or doing other repetitive activities (overuse injury). The pain from plantar fasciitis can range from mild irritation to severe pain that makes it difficult to walk or move. The pain is usually worse in the morning after sleeping, or after sitting or lying down for a while. Pain may also be worse after long periods of walking or standing. What are the causes? This condition may be caused by:  Standing for long periods of time.  Wearing shoes that do not have good arch support.  Doing activities that put stress on joints (high-impact activities), including running, aerobics, and ballet.  Being overweight.  An abnormal way of walking (gait).  Tight muscles in the back of your lower leg (calf).  High arches in your feet.  Starting a new athletic activity. What are the signs or symptoms? The main symptom of this condition is heel pain. Pain may:  Be worse with first steps after a time of rest, especially in the morning after sleeping or after you have been sitting or lying down for a while.  Be worse after long periods of standing still.  Decrease after 30-45  minutes of activity, such as gentle walking. How is this diagnosed? This condition may be diagnosed based on your medical history and your symptoms. Your health care provider may ask questions about your activity level. Your health care provider will do a physical exam to check for:  A tender area on the bottom of your foot.  A high arch in your foot.  Pain when you move your foot.  Difficulty moving your foot. You may have imaging tests to confirm the diagnosis, such as:  X-rays.  Ultrasound.  MRI. How is this treated? Treatment for plantar fasciitis depends on how severe your condition is. Treatment may include:  Rest, ice, applying pressure (compression), and raising the affected foot (elevation). This may be called RICE therapy. Your health care provider may recommend RICE therapy along with over-the-counter pain medicines to manage your pain.  Exercises to stretch your calves and your plantar fascia.  A splint that holds your foot in a stretched, upward position while you sleep (night splint).  Physical therapy to relieve symptoms and prevent problems in the future.  Injections of steroid medicine (cortisone) to relieve pain and inflammation.  Stimulating your plantar fascia with electrical impulses (extracorporeal shock wave therapy). This is usually the last treatment option before surgery.  Surgery, if other treatments have not worked after 12 months. Follow these instructions at home:  Managing pain, stiffness, and swelling  If directed, put ice on the painful area: ?  Put ice in a plastic bag, or use a frozen bottle of water. ? Place a towel between your skin and the bag or bottle. ? Roll the bottom of your foot over the bag or bottle. ? Do this for 20 minutes, 2-3 times a day.  Wear athletic shoes that have air-sole or gel-sole cushions, or try wearing soft shoe inserts that are designed for plantar fasciitis.  Raise (elevate) your foot above the level of your  heart while you are sitting or lying down. Activity  Avoid activities that cause pain. Ask your health care provider what activities are safe for you.  Do physical therapy exercises and stretches as told by your health care provider.  Try activities and forms of exercise that are easier on your joints (low-impact). Examples include swimming, water aerobics, and biking. General instructions  Take over-the-counter and prescription medicines only as told by your health care provider.  Wear a night splint while sleeping, if told by your health care provider. Loosen the splint if your toes tingle, become numb, or turn cold and blue.  Maintain a healthy weight, or work with your health care provider to lose weight as needed.  Keep all follow-up visits as told by your health care provider. This is important. Contact a health care provider if you:  Have symptoms that do not go away after caring for yourself at home.  Have pain that gets worse.  Have pain that affects your ability to move or do your daily activities. Summary  Plantar fasciitis is a painful foot condition that affects the heel. It occurs when the band of tissue that connects the toes to the heel bone (plantar fascia) becomes irritated.  The main symptom of this condition is heel pain that may be worse after exercising too much or standing still for a long time.  Treatment varies, but it usually starts with rest, ice, compression, and elevation (RICE therapy) and over-the-counter medicines to manage pain. This information is not intended to replace advice given to you by your health care provider. Make sure you discuss any questions you have with your health care provider. Document Revised: 02/01/2017 Document Reviewed: 12/17/2016 Elsevier Patient Education  2020 [place].    Pronator Syndrome  The median nerve is a nerve in the forearm that enables feeling and muscle function on certain parts of the hand. Pronator  syndrome is a condition that happens when the median nerve has pressure (compression) placed on it by a muscle or other structure on the inner side of the forearm near the elbow. The condition can cause weakness or tingling in the thumb, index, middle, and ring fingers. It can also cause a dull ache or pain in the forearm. What are the causes? This condition may be caused by:  Overuse or repetitive movements that increase the size of a muscle in your forearm (pronator teres).  Trauma or a hard, direct hit to the forearm, resulting in swelling (hematoma).  A problem that is present at birth (congenital defect). What increases the risk? This condition is more likely to develop in:  Weightlifters.  People who play sports that require forearm rotation, such as baseball, tennis, or golf.  People who have a job that requires grasping objects and twisting the forearm, such as carpentry.  Adults who are 41-23 years old.  Females. What are the signs or symptoms? Symptoms of this condition include:  An ache or pain in the palm side of your forearm, close to your elbow.  A tingling or prickly feeling (sensation) in your thumb, index, middle, and half of your ring fingers.  Weakness in your hand, especially when pinching your index finger and thumb together.  Symptoms that get worse with repetitive movement, gripping, or forearm rotation. How is this diagnosed? This condition may be diagnosed based on:  Your medical history.  A physical exam. Your health care provider may ask you to move your hand, fingers, wrist, and arm in certain ways. Doing this will help your health care provider find the source of your pain.  Tests and imaging studies, including: ? An electromyogram. This test can show how well the median nerve is working and show if there is too much pressure on it or a nearby nerve. ? A nerve conduction study. This test measures how well electrical signals pass through your  nerves. ? X-ray. This test may be done to check for an underlying bone problem. How is this treated? This condition may be treated with:  Rest. Avoid or limit activities that cause your symptoms to get worse or flare up.  NSAIDs, such as ibuprofen, or steroid medicine. These medicines may be prescribed to help with pain and swelling.  A splint or brace. You may need to wear a splint or brace for support until your symptoms improve.  Exercises to help you maintain movement (physical or occupational therapy). This involves doing hand and arm exercises. If treatments do not help, you may need:  An injection of an anti-inflammatory medicine (steroid) mixed with a numbing medicine (local anesthetic).  Surgery. This is usually done only if other treatments fail and symptoms continue for more than 6 months. Follow these instructions at home: If you have a splint or brace:  Wear it as told by your health care provider. Remove it only as told by your health care provider.  Loosen it if your fingers tingle, become numb, or turn cold and blue.  Keep it clean.  If the splint or brace is not waterproof: ? Do not let it get wet. ? Cover it with a watertight covering when you take a bath or shower. Managing pain, stiffness, and swelling   Take over-the-counter and prescription medicines only as told by your health care provider.  Move your fingers often to reduce stiffness and swelling.  Raise (elevate) the injured area above the level of your heart while you are sitting or lying down.  If directed, put ice on the injured area. ? If you have a removable splint or brace, remove it as told by your health care provider. ? Put ice in a plastic bag. ? Place a towel between your skin and the bag. ? Leave the ice on for 20 minutes, 2-3 times a day. Activity  Do exercises as told by your health care provider.  Return to your normal activities as told by your health care provider. Ask your  health care provider what activities are safe for you.  Do not lift anything that is heavier than 10 lb (4.5 kg), or the limit that you are told, until your health care provider says that it is safe. General instructions  Do not use any products that contain nicotine or tobacco, such as cigarettes, e-cigarettes, and chewing tobacco. These can delay healing. If you need help quitting, ask your health care provider.  Keep all follow-up visits as told by your health care provider. This is important. How is this prevented?  Warm up and stretch before being active.  Cool down and  stretch after being active.  Give your body time to rest between periods of activity.  Maintain physical fitness, including strength and flexibility. Contact a health care provider if:  Your symptoms do not improve in 4-6 weeks.  Your symptoms get worse.  Your splint or brace is causing pain, numbness, or tingling that is new. Get help right away if:  Your pain is severe.  You cannot move part of your hand or arm. Summary  Pronator syndrome involves pressure (compression) of the median nerve on the inner side of your forearm near the elbow.  This condition may be treated by wearing a splint or brace, taking medicines, doing exercises, or having surgery.  Follow instructions about restricting activity, using a splint or brace, taking medicines, or resting.  Contact a health care provider if your symptoms do not get better after 4-6 weeks.  Get help right away if your pain is severe, or if you cannot move your hand or arm. This information is not intended to replace advice given to you by your health care provider. Make sure you discuss any questions you have with your health care provider. Document Revised: 04/30/2018 Document Reviewed: 05/01/2018 Elsevier Patient Education  [place].

## 2019-08-26 NOTE — Assessment & Plan Note (Signed)
Patient complaining of pain along the plantar aspect of her right foot and heel that is worse in the mornings when she first gets up. Very tight and painful but improves after stretching for awhile. She works at a factory and Personnel officer. She is on her feet for 12+ hours a day. Discussed that this is likely plantar fascitis. Advised stretching daily exercises, ice when able, and ibuprofen PRN. If symptoms persist may need to consider sports med referral for injection.

## 2019-08-26 NOTE — Assessment & Plan Note (Signed)
Patient reports multiple stressors including deaths in the family which she is having a hard time coping with. She is also having some relationship issues with her wife, and has considered leaving her. Says she tends to run from her problems and recognizes that this is not a healthy coping mechanism. Offered referral to [facility] our clinic counselor for therapy, she would like that.  -- Ambulatory referral to integrated behavioral health

## 2019-08-26 NOTE — Assessment & Plan Note (Signed)
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 She works at a [employer] and uses her left arm repetitively to pick up products which are often heavy for 12+ hours a day. Nerve conduction studies were ordered at her last visit, but she admit she did not call neurology back to schedule her appointment. Requesting another referral. Discussed referring to ortho for management, however she would like to confirm the diagnosis first. -- Referral placed for nerve conduction study

## 2019-09-08 ENCOUNTER — Telehealth: Payer: Self-pay | Admitting: Licensed Clinical Social Worker

## 2019-09-08 ENCOUNTER — Encounter: Payer: Self-pay | Admitting: Licensed Clinical Social Worker

## 2019-09-08 NOTE — Telephone Encounter (Signed)
Patient was called to discuss a referral for services in our office. Patient did not answer, and a vm was left for the patient to contact our office to schedule an appointment. A letter will also be mailed today.

## 2019-09-10 ENCOUNTER — Encounter: Payer: Self-pay | Admitting: Neurology

## 2019-10-08 NOTE — Addendum Note (Signed)
Addended by: Hulan Fray on: 10/08/2019 05:34 PM   Modules accepted: Orders

## 2019-11-13 ENCOUNTER — Ambulatory Visit (INDEPENDENT_AMBULATORY_CARE_PROVIDER_SITE_OTHER): Payer: BC Managed Care – PPO | Admitting: Internal Medicine

## 2019-11-13 ENCOUNTER — Other Ambulatory Visit (INDEPENDENT_AMBULATORY_CARE_PROVIDER_SITE_OTHER): Payer: BC Managed Care – PPO

## 2019-11-13 ENCOUNTER — Encounter: Payer: Self-pay | Admitting: Internal Medicine

## 2019-11-13 ENCOUNTER — Other Ambulatory Visit: Payer: Self-pay

## 2019-11-13 VITALS — BP 118/80 | HR 74 | Ht 66.0 in | Wt 193.0 lb

## 2019-11-13 DIAGNOSIS — E0500 Thyrotoxicosis with diffuse goiter without thyrotoxic crisis or storm: Secondary | ICD-10-CM | POA: Diagnosis not present

## 2019-11-13 LAB — T3, FREE: T3, Free: 3.5 pg/mL (ref 2.3–4.2)

## 2019-11-13 LAB — T4, FREE: Free T4: 0.69 ng/dL (ref 0.60–1.60)

## 2019-11-13 LAB — TSH: TSH: 2.35 u[IU]/mL (ref 0.35–4.50)

## 2019-11-13 MED ORDER — METHIMAZOLE 5 MG PO TABS
ORAL_TABLET | ORAL | 3 refills | Status: DC
Start: 2019-11-13 — End: 2020-03-18

## 2019-11-13 NOTE — Patient Instructions (Signed)
Please continue methimazole 5 mg in a.m. and 2.5 mg in p.m.   Please stop at the lab.  Please come back for a follow-up appointment in 4-6 months, but likely sooner for labs.

## 2019-11-13 NOTE — Progress Notes (Signed)
Patient ID: Christy Burke, female   DOB: 1960-12-20, 59 y.o.   MRN: [id number]   This visit occurred during the SARS-CoV-2 public health emergency.  Safety protocols were in place, including screening questions prior to the visit, additional usage of staff PPE, and extensive cleaning of exam room while observing appropriate contact time as indicated for disinfecting solutions.   HPI  Christy Burke is a 59 y.o.-year-old female, returning for follow-up for Graves' disease and Graves' ophthalmopathy.  Last visit 4 months ago.  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 Labs remained abnormal 2 weeks later.  At that time, she was started on: - Methimazole 10 mg 3x a day - Propranolol 10 mg 2x a day  Retrospectively, she did have: - hot flushes (but had a hysterectomy) - L eye bulging - some blurry vision, but not diplopia, chemosis, pain - weight loss: 20 lbs - in last 4 mo - lack of appetite - tremors - palpitations - better on beta blockers - no hyperdefecation - no insomnia - + anxiety  In 03/2017, she was 3 times a day and Inderal 10 mg twice a day.  The symptoms resolved, except for hot flashes, for which she is on HRT.  We subsequently decreased the dose of methimazole and stopped Inderal.  However, she was not usually returning for last between appointments despite advised to do so. In 07/2018, her TSH was elevated, so I advised her to reduce the methimazole dose to 5 mg twice a day and come back for labs in 1.5 months.  She did not return...  In 03/2019, we decreased her methimazole dose further to 5 mg a.m. and 2.5 mg in p.m.  She did not return for labs... In 07/2019 she ran out of methimazole 5 days prior to the appointment... We restarted 5 mg in am and 2.5 mg later in the day.  Reviewed her TFTs: Lab Results  Component Value Date   TSH 0.91 07/10/2019   TSH 2.66  03/11/2019   TSH 1.32 11/05/2018   TSH 11.77 (H) 07/16/2018   TSH 0.59 11/25/2017   TSH <0.01 Repeated and verified X2. (L) 07/26/2017   TSH <0.006 (L) 04/15/2017   TSH <0.010 (L) 04/02/2017   FREET4 0.80 07/10/2019   FREET4 0.65 03/11/2019   FREET4 0.77 11/05/2018   FREET4 0.50 (L) 07/16/2018   FREET4 0.63 11/25/2017   FREET4 1.10 07/26/2017   FREET4 3.04 (H) 04/15/2017   FREET4 2.34 (H) 04/02/2017   T3FREE 3.1 07/10/2019   T3FREE 2.9 03/11/2019   T3FREE 3.0 11/05/2018   T3FREE 2.9 07/16/2018   T3FREE 3.0 11/25/2017   T3FREE 3.6 07/26/2017   Her Graves' antibodies are elevated: Component     Latest Ref Rng & Units 04/15/2017  Thyrotropin Receptor Ab     0.00 - 1.75 IU/L 12.16 (H)   Lab Results  Component Value Date   TSI 236 (H) 03/11/2019   TSI 319 (H) 07/26/2017   Pt denies: - feeling nodules in neck - hoarseness - dysphagia - choking - SOB with lying down  Pt does have a FH of thyroid ds.: 2nd cousin and niece with Graves ds.  No FH of thyroid cancer. No h/o radiation tx to head or neck.  No seaweed or kelp. No recent contrast studies. No herbal supplements. No Biotin use. No recent steroids use.   Pt. also has a history of  kidney stones in 2012, 2014, 2016, 2018.  She has a history of lithotripsy.  She saw her ophthalmologist in the past: No evidence of active Graves' ophthalmopathy.  She follows with them yearly.    ROS: Constitutional: no weight gain/no weight loss, no fatigue, no subjective hyperthermia, no subjective hypothermia Eyes: no blurry vision, no xerophthalmia ENT: no sore throat, + see HPI Cardiovascular: no CP/no SOB/no palpitations/no leg swelling Respiratory: no cough/no SOB/no wheezing Gastrointestinal: no N/no V/no D/no C/no acid reflux Musculoskeletal: no muscle aches/no joint aches Skin: no rashes, no hair loss Neurological: no tremors/no numbness/no tingling/no dizziness  I reviewed pt's medications, allergies, PMH, social hx,  family hx, and changes were documented in the history of present illness. Otherwise, unchanged from my initial visit note.  Past Medical History:  Diagnosis Date  . Asthma   . History of umbilical hernia   . Renal stones    Past Surgical History:  Procedure Laterality Date  . lithotrypsy    . UMBILICAL HERNIA REPAIR  1990   Social History   Socioeconomic History  . Marital status: Married    Spouse name: Not on file  . Number of children: 68: 34 year old in 2019  . Years of education: Not on file  . Highest education level: Not on file  Occupational History  .  Transport planner  Social Needs  . Financial resource strain: Not on file  . Food insecurity:    Worry: Not on file    Inability: Not on file  . Transportation needs:    Medical: Not on file    Non-medical: Not on file  Tobacco Use  . Smoking status: Current Every Day Smoker    Packs/day: 1.00    Years: 39.00    Pack years: 39.00    Types: Cigarettes  . Smokeless tobacco: Never Used  . Tobacco comment: wants Nicotine gum. cutting back  Substance and Sexual Activity  . 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 Drug use: No    Types: Cocaine    Comment: Quit several years ago, never did IVDU only smoked cocaine  . Sexual activity: Yes    Birth control/protection: Other-see comments    Comment: In same sex relationship for 19 years   Current Outpatient Medications on File Prior to Visit  Medication Sig Dispense Refill  . albuterol (VENTOLIN HFA) 108 (90 Base) MCG/ACT inhaler Inhale 2 puffs into the lungs every 6 (six) hours as needed for wheezing or shortness of breath. 1 Inhaler 0  . Ascorbic Acid (VITAMIN C PO) Take 1 tablet by mouth daily. Reported on 06/13/2015    . ELDERBERRY PO Take by mouth.    . estradiol (ESTRACE) 1 MG tablet Take 1 tablet (1 mg total) by mouth daily. 90 tablet 4  . hydrochlorothiazide (HYDRODIURIL) 12.5  MG tablet Take 1 tablet (12.5 mg total) by mouth daily. 90 tablet 1  . ibuprofen (ADVIL,MOTRIN) 600 MG tablet Take 1 tablet (600 mg total) by mouth every 6 (six) hours as needed (mild pain). 20 tablet 0  . methimazole (TAPAZOLE) 5 MG tablet Take by mouth 5 mg in am and 2.5 mg in pm 135 tablet 3  . nicotine (NICODERM CQ - DOSED IN MG/24 HR) 7 mg/24hr patch Place 1 patch (7 mg total) onto the skin daily. 30 patch 0  . propranolol (INDERAL) 10 MG tablet Take 1 tablet by mouth  twice daily 180 tablet 0   No current facility-administered medications on file prior to visit.   No Known Allergies Family History  Problem Relation Age of Onset  . Cancer Mother   . Diabetes Father   . Colon cancer Neg Hx    PE: BP 118/80   Pulse 74   Ht 5\' 6"  (1.676 m)   Wt 193 lb (87.5 kg)   LMP 03/14/2015 (Approximate)   SpO2 98%   BMI 31.15 kg/m  Wt Readings from Last 3 Encounters:  11/13/19 193 lb (87.5 kg)  08/26/19 192 lb (87.1 kg)  07/10/19 195 lb (88.5 kg)   Constitutional: overweight, in NAD Eyes: PERRLA, EOMI, no exophthalmos ENT: moist mucous membranes, + thyromegaly, no cervical lymphadenopathy Cardiovascular: RRR, No MRG Respiratory: CTA B Gastrointestinal: abdomen soft, NT, ND, BS+ Musculoskeletal: no deformities, strength intact in all 4 Skin: moist, warm, no rashes Neurological: no tremor with outstretched hands, DTR normal in all 4  ASSESSMENT: 1. Graves ds.   2.  Graves' ophthalmopathy  PLAN:  1. Patient with history of enlarged thyroid discovered by patient when she was looking in the mirror in 03/2017.  Around that time, she was also told that her left eye was mildly bulging.  She saw her PCP and she was found to have thyrotoxicosis.  Labs remained abnormal 2 weeks later and she was also found to have a high thyrotropin receptor antibody titer.  This confirms Graves' disease. She was started on methimazole 10 mg 3 times a day and Inderal 10 mg twice a day.  Retrospectively, she  also noticed weight loss, heat intolerance, palpitations, tremors, increased appetite, anxiety.  Symptoms resolved after starting the above medications.  However, she was not compliant with her thyroid hormone checks and we could not adjust the dose of methimazole in a timely fashion.  In 03/2019, we decreased the methimazole dose and she again did not present for labs in 1.5 months after the dose change.  Also, 5 days before last visit, she ran out of methimazole.  I strongly advised her not to run out of medication again. -At last visit, thyroid tests were normal so I advised her to resume the previous dose of methimazole, 5 mg in a.m. and 2.5 mg in p.m. -No hyper or hypothyroid complaints at this visit -We will repeat her TFTs today -We again discussed about the importance of coming back for labs in 5 to 6 weeks if we need to change her methimazole dose. -I we will see her back in 4 months  2.  Graves' ophthalmopathy -Left eye exophthalmos improved, not visible anymore -She also denies chemosis, diplopia, pain with eye movement -Her TSI's were elevated in the past, but decreasing.  We will repeat these at next visit. -She continues to follow with ophthalmology on a yearly basis  Orders Placed This Encounter  Procedures  . TSH  . T4, free  . T3, free   Component     Latest Ref Rng & Units 11/13/2019  TSH     0.35 - 4.50 uIU/mL 2.35  T4,Free(Direct)     0.60 - 1.60 ng/dL 0.69  Triiodothyronine,Free,Serum     2.3 - 4.2 pg/mL 3.5   Labs are normal.  We can decrease the methimazole dose to only 5 mg daily.  We will recheck her tests in 5 to 6 weeks.  Philemon Kingdom, MD PhD [facility] Endocrinology

## 2019-11-16 ENCOUNTER — Ambulatory Visit: Payer: BC Managed Care – PPO | Admitting: Neurology

## 2019-12-30 ENCOUNTER — Encounter: Payer: BC Managed Care – PPO | Admitting: Neurology

## 2020-01-04 ENCOUNTER — Other Ambulatory Visit: Payer: Self-pay | Admitting: Internal Medicine

## 2020-01-04 DIAGNOSIS — I10 Essential (primary) hypertension: Secondary | ICD-10-CM

## 2020-02-01 ENCOUNTER — Other Ambulatory Visit: Payer: Self-pay | Admitting: Internal Medicine

## 2020-02-01 DIAGNOSIS — I10 Essential (primary) hypertension: Secondary | ICD-10-CM

## 2020-02-02 NOTE — Telephone Encounter (Signed)
Refill for HCTZ approved. Please have patient schedule follow up with me, next available. Thanks!

## 2020-02-19 ENCOUNTER — Other Ambulatory Visit: Payer: Self-pay | Admitting: Internal Medicine

## 2020-02-19 ENCOUNTER — Ambulatory Visit: Payer: BC Managed Care – PPO | Admitting: Neurology

## 2020-02-19 DIAGNOSIS — I10 Essential (primary) hypertension: Secondary | ICD-10-CM

## 2020-02-19 NOTE — Telephone Encounter (Signed)
°  hydrochlorothiazide (HYDRODIURIL) 12.5 MG tablet, REFILL REQUEST @  [facility] ([facility]), [place] - [id number] Phone:  [phone]  Fax:  [phone]

## 2020-03-18 ENCOUNTER — Ambulatory Visit: Payer: BC Managed Care – PPO | Admitting: Internal Medicine

## 2020-03-18 ENCOUNTER — Other Ambulatory Visit: Payer: Self-pay

## 2020-03-18 ENCOUNTER — Other Ambulatory Visit: Payer: Self-pay | Admitting: Internal Medicine

## 2020-03-18 ENCOUNTER — Encounter: Payer: Self-pay | Admitting: Internal Medicine

## 2020-03-18 VITALS — BP 140/90 | HR 79 | Ht 66.0 in | Wt 189.4 lb

## 2020-03-18 DIAGNOSIS — I10 Essential (primary) hypertension: Secondary | ICD-10-CM

## 2020-03-18 DIAGNOSIS — E0500 Thyrotoxicosis with diffuse goiter without thyrotoxic crisis or storm: Secondary | ICD-10-CM | POA: Diagnosis not present

## 2020-03-18 LAB — T3, FREE: T3, Free: 3.1 pg/mL (ref 2.3–4.2)

## 2020-03-18 LAB — TSH: TSH: 2.73 u[IU]/mL (ref 0.35–4.50)

## 2020-03-18 LAB — T4, FREE: Free T4: 0.66 ng/dL (ref 0.60–1.60)

## 2020-03-18 MED ORDER — METHIMAZOLE 5 MG PO TABS: ORAL_TABLET | ORAL | 3 refills | Status: DC

## 2020-03-18 NOTE — Patient Instructions (Signed)
Please continue methimazole 5 mg in a.m.  Please stop at the lab.  Please come back for a follow-up appointment in 6 months, but possibly sooner for labs.

## 2020-03-18 NOTE — Progress Notes (Signed)
Patient ID: Christy Burke, female   DOB: 06-02-1960, 60 y.o.   MRN: [id number]   This visit occurred during the SARS-CoV-2 public health emergency.  Safety protocols were in place, including screening questions prior to the visit, additional usage of staff PPE, and extensive cleaning of exam room while observing appropriate contact time as indicated for disinfecting solutions.   HPI  Christy Burke is a 60 y.o.-year-old female, returning for follow-up for Graves' disease and Graves' ophthalmopathy.  Last visit 4 months ago.  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 Labs remained abnormal 2 weeks later.  At that time, she was started on: - Methimazole 10 mg 3x a day - Propranolol 10 mg 2x a day  Retrospectively, she did have: - hot flushes (but had a hysterectomy) - L eye bulging - some blurry vision, but not diplopia, chemosis, pain - weight loss: 20 lbs - in last 4 mo - lack of appetite - tremors - palpitations - better on beta blockers - no hyperdefecation - no insomnia - + anxiety  In 03/2017, she was 3 times a day and Inderal 10 mg twice a day.  The symptoms resolved, except for hot flashes, for which she is on HRT.  We subsequently decreased the dose of methimazole and stopped Inderal.  However, she was not usually returning for last between appointments despite advised to do so. In 07/2018, her TSH was elevated, so I advised her to reduce the methimazole dose to 5 mg twice a day and come back for labs in 1.5 months.  She did not return...  In 03/2019, we decreased her methimazole dose further to 5 mg a.m. and 2.5 mg in p.m.  She did not return for labs... In 07/2019 she ran out of methimazole 5 days prior to the appointment... We restarted 5 mg in am and 2.5 mg later in the day. In 11/2019, we decrease methimazole to 5 mg daily.  Reviewed her TFTs: Lab Results   Component Value Date   TSH 2.35 11/13/2019   TSH 0.91 07/10/2019   TSH 2.66 03/11/2019   TSH 1.32 11/05/2018   TSH 11.77 (H) 07/16/2018   TSH 0.59 11/25/2017   TSH <0.01 Repeated and verified X2. (L) 07/26/2017   TSH <0.006 (L) 04/15/2017   TSH <0.010 (L) 04/02/2017   FREET4 0.69 11/13/2019   FREET4 0.80 07/10/2019   FREET4 0.65 03/11/2019   FREET4 0.77 11/05/2018   FREET4 0.50 (L) 07/16/2018   FREET4 0.63 11/25/2017   FREET4 1.10 07/26/2017   FREET4 3.04 (H) 04/15/2017   FREET4 2.34 (H) 04/02/2017   T3FREE 3.5 11/13/2019   T3FREE 3.1 07/10/2019   T3FREE 2.9 03/11/2019   T3FREE 3.0 11/05/2018   T3FREE 2.9 07/16/2018   T3FREE 3.0 11/25/2017   T3FREE 3.6 07/26/2017   Her Graves' antibodies are elevated: Component     Latest Ref Rng & Units 04/15/2017  Thyrotropin Receptor Ab     0.00 - 1.75 IU/L 12.16 (H)   Lab Results  Component Value Date   TSI 236 (H) 03/11/2019   TSI 319 (H) 07/26/2017   Pt denies: - feeling nodules in neck - hoarseness - dysphagia - choking - SOB with lying down  Pt does have a FH of thyroid ds.: 2nd cousin and niece with Graves ds. No FH of thyroid cancer. No h/o radiation tx to head or neck.  No  seaweed or kelp. No recent contrast studies. No herbal supplements. No Biotin use. No recent steroids use.   Pt. also has a history of kidney stones in 2012, 2014, 2016, 2018.  She has a history of lithotripsy.  She does not have active Graves' ophthalmopathy.  She follows with ophthalmology every year. She recently saw her eye dr. At [facility] >> small cataract.  She had TAH several years ago. She developed hot flushes after this.  ROS: Constitutional: no weight gain/no weight loss, no fatigue, + subjective hyperthermia, no subjective hypothermia Eyes: no blurry vision, no xerophthalmia ENT: no sore throat, + see HPI Cardiovascular: no CP/no SOB/no palpitations/no leg swelling Respiratory: no cough/no SOB/no wheezing Gastrointestinal: no N/no  V/no D/no C/no acid reflux Musculoskeletal: no muscle aches/no joint aches Skin: no rashes, no hair loss Neurological: no tremors/no numbness/no tingling/no dizziness  I reviewed pt's medications, allergies, PMH, social hx, family hx, and changes were documented in the history of present illness. Otherwise, unchanged from my initial visit note.  Past Medical History:  Diagnosis Date  . Asthma   . History of umbilical hernia   . Renal stones    Past Surgical History:  Procedure Laterality Date  . lithotrypsy    . UMBILICAL HERNIA REPAIR  1990   Social History   Socioeconomic History  . Marital status: Married    Spouse name: Not on file  . Number of children: 52: 55 year old in 2019  . Years of education: Not on file  . Highest education level: Not on file  Occupational History  .  Musician  Social Needs  . Financial resource strain: Not on file  . Food insecurity:    Worry: Not on file    Inability: Not on file  . Transportation needs:    Medical: Not on file    Non-medical: Not on file  Tobacco Use  . Smoking status: Current Every Day Smoker    Packs/day: 1.00    Years: 39.00    Pack years: 39.00    Types: Cigarettes  . Smokeless tobacco: Never Used  . Tobacco comment: wants Nicotine gum. cutting back  Substance and Sexual Activity  . 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 Drug use: No    Types: Cocaine    Comment: Quit several years ago, never did IVDU only smoked cocaine  . Sexual activity: Yes    Birth control/protection: Other-see comments    Comment: In same sex relationship for 19 years   Current Outpatient Medications on File Prior to Visit  Medication Sig Dispense Refill  . albuterol (VENTOLIN HFA) 108 (90 Base) MCG/ACT inhaler Inhale 2 puffs into the lungs every 6 (six) hours as needed for wheezing or shortness of breath. 1 Inhaler 0  . Ascorbic Acid (VITAMIN C  PO) Take 1 tablet by mouth daily. Reported on 06/13/2015    . ELDERBERRY PO Take by mouth.    . estradiol (ESTRACE) 1 MG tablet Take 1 tablet (1 mg total) by mouth daily. 90 tablet 4  . hydrochlorothiazide (HYDRODIURIL) 12.5 MG tablet Take 1 tablet by mouth once daily 90 tablet 0  . ibuprofen (ADVIL,MOTRIN) 600 MG tablet Take 1 tablet (600 mg total) by mouth every 6 (six) hours as needed (mild pain). 20 tablet 0  . methimazole (TAPAZOLE) 5 MG tablet Take by mouth 5 mg daily with a meal. 90 tablet 3  No current facility-administered medications on file prior to visit.   No Known Allergies Family History  Problem Relation Age of Onset  . Cancer Mother   . Diabetes Father   . Colon cancer Neg Hx    PE: BP 140/90 (BP Location: Right Arm, Patient Position: Sitting, Cuff Size: Normal)   Pulse 79   Ht 5\' 6"  (1.676 m)   Wt 189 lb 6.4 oz (85.9 kg)   LMP 03/14/2015 (Approximate)   SpO2 97%   BMI 30.57 kg/m  Wt Readings from Last 3 Encounters:  03/18/20 189 lb 6.4 oz (85.9 kg)  11/13/19 193 lb (87.5 kg)  08/26/19 192 lb (87.1 kg)   Constitutional: overweight, in NAD Eyes: PERRLA, EOMI, no exophthalmos ENT: moist mucous membranes, + thyromegaly, no cervical lymphadenopathy Cardiovascular: RRR, No MRG Respiratory: CTA B Gastrointestinal: abdomen soft, NT, ND, BS+ Musculoskeletal: no deformities, strength intact in all 4 Skin: moist, warm, no rashes Neurological: + very mild tremor with outstretched hands, DTR normal in all 4  ASSESSMENT: 1. Graves ds.   2.  Graves' ophthalmopathy  PLAN:  1. Patient with history of enlarged thyroid discovered by patient when she was looking in the mirror in 03/2017.  Around that time, she was also told that her left eye was mildly bulging.  She saw her PCP and she was found to have thyrotoxicosis.  Labs remain abnormal 2 weeks later and she was also found to have a high thyrotropin receptor antibody titer, confirming Graves' disease.  She was started  on methimazole 10 mg 3 times a day and Inderal 10 mg twice a day. Retrospectively, she also noticed weight loss, heat intolerance, palpitations, tremors, increased appetite, anxiety.  Symptoms resolved after starting the above medications.  However, she was not compliant with her thyroid hormone checks and we could not adjust the dose of methimazole in a timely fashion.  In 03/2019, we decreased the methimazole dose and she again did not present for labs in 1.5 months after the dose change.  Also, 5 days before last visit, she ran out of methimazole.  I strongly advised her not to run out of medication again.  At last visit, we discussed about the importance of taking the medication consistently.  We started her back on 5 mg daily. -At this visit, she continues on 5 mg of methimazole daily, which she tolerates well -She does not have hyper or hypothyroid complaints at this visit. She has hot flushes but likely postmenopausal (developed after TAH) -We will repeat her TFTs today -I we will see her back in 6 months  2.  Graves' ophthalmopathy -Her left eye exophthalmos has improved, not visible anymore -She also denies chemosis, diplopia, pain with eye movement -Her TSI's were elevated in the past but decreasing.  We will repeat this today -She continues to follow-up with ophthalmology on a yearly basis  Needs refills.  Component     Latest Ref Rng & Units 03/18/2020  TSH     0.35 - 4.50 uIU/mL 2.73  T4,Free(Direct)     0.60 - 1.60 ng/dL 0.66  Triiodothyronine,Free,Serum     2.3 - 4.2 pg/mL 3.1  Thyroid tests are normal.  We will refill her methimazole. Will plan to repeat her TFTs in 3 months.  Philemon Kingdom, MD PhD [facility]

## 2020-03-30 ENCOUNTER — Telehealth: Payer: Self-pay | Admitting: *Deleted

## 2020-03-30 ENCOUNTER — Encounter: Payer: BC Managed Care – PPO | Admitting: Internal Medicine

## 2020-03-30 NOTE — Telephone Encounter (Signed)
Pt was no show to her appt this morning; called pt tp re-schedule. No answer; left message to call the office.

## 2020-04-11 ENCOUNTER — Other Ambulatory Visit: Payer: Self-pay | Admitting: Internal Medicine

## 2020-04-11 DIAGNOSIS — Z1231 Encounter for screening mammogram for malignant neoplasm of breast: Secondary | ICD-10-CM

## 2020-05-27 ENCOUNTER — Other Ambulatory Visit: Payer: Self-pay

## 2020-05-27 ENCOUNTER — Ambulatory Visit
Admission: RE | Admit: 2020-05-27 | Discharge: 2020-05-27 | Disposition: A | Discharge status: Home / Self Care | Payer: BC Managed Care – PPO | Source: Ambulatory Visit | Attending: Internal Medicine | Admitting: Internal Medicine

## 2020-05-27 DIAGNOSIS — Z1231 Encounter for screening mammogram for malignant neoplasm of breast: Secondary | ICD-10-CM

## 2020-07-27 ENCOUNTER — Other Ambulatory Visit: Payer: Self-pay | Admitting: Internal Medicine

## 2020-07-27 DIAGNOSIS — I10 Essential (primary) hypertension: Secondary | ICD-10-CM

## 2020-07-27 NOTE — Telephone Encounter (Signed)
Approved 30 day refill until patient can follow up. Thanks.

## 2020-07-28 ENCOUNTER — Other Ambulatory Visit: Payer: Self-pay | Admitting: Internal Medicine

## 2020-07-28 DIAGNOSIS — I10 Essential (primary) hypertension: Secondary | ICD-10-CM

## 2020-08-01 ENCOUNTER — Other Ambulatory Visit: Payer: Self-pay | Admitting: Internal Medicine

## 2020-08-01 MED ORDER — METHIMAZOLE 5 MG PO TABS: ORAL_TABLET | ORAL | 1 refills | Status: DC

## 2020-08-01 NOTE — Addendum Note (Signed)
Addended by: Jacqualin Combes on: 08/01/2020 12:36 PM   Modules accepted: Orders

## 2020-08-24 ENCOUNTER — Other Ambulatory Visit: Payer: Self-pay

## 2020-08-24 ENCOUNTER — Encounter: Payer: Self-pay | Admitting: Internal Medicine

## 2020-08-24 ENCOUNTER — Ambulatory Visit: Payer: BC Managed Care – PPO | Admitting: Internal Medicine

## 2020-08-24 ENCOUNTER — Telehealth: Payer: Self-pay | Admitting: *Deleted

## 2020-08-24 VITALS — BP 131/93 | HR 72 | Temp 98.0°F | Ht 66.0 in | Wt 192.8 lb

## 2020-08-24 DIAGNOSIS — Z23 Encounter for immunization: Secondary | ICD-10-CM | POA: Diagnosis not present

## 2020-08-24 DIAGNOSIS — Z716 Tobacco abuse counseling: Secondary | ICD-10-CM | POA: Diagnosis not present

## 2020-08-24 DIAGNOSIS — I10 Essential (primary) hypertension: Secondary | ICD-10-CM

## 2020-08-24 DIAGNOSIS — E0500 Thyrotoxicosis with diffuse goiter without thyrotoxic crisis or storm: Secondary | ICD-10-CM

## 2020-08-24 DIAGNOSIS — F430 Acute stress reaction: Secondary | ICD-10-CM

## 2020-08-24 DIAGNOSIS — R7303 Prediabetes: Secondary | ICD-10-CM | POA: Diagnosis not present

## 2020-08-24 LAB — POCT GLYCOSYLATED HEMOGLOBIN (HGB A1C): Hemoglobin A1C: 5.9 % — AB (ref 4.0–5.6)

## 2020-08-24 LAB — GLUCOSE, CAPILLARY: Glucose-Capillary: 90 mg/dL (ref 70–99)

## 2020-08-24 MED ORDER — ZOSTER VAC RECOMB ADJUVANTED 50 MCG/0.5ML IM SUSR: 0.5000 mL | Freq: Once | INTRAMUSCULAR | 0 refills | Status: AC

## 2020-08-24 MED ORDER — BUPROPION HCL ER (SR) 150 MG PO TB12: 150.0000 mg | ORAL_TABLET | Freq: Two times a day (BID) | ORAL | 2 refills | Status: DC

## 2020-08-24 MED ORDER — HYDROCHLOROTHIAZIDE 25 MG PO TABS: 25.0000 mg | ORAL_TABLET | Freq: Every day | ORAL | 1 refills | Status: DC

## 2020-08-24 NOTE — Patient Instructions (Addendum)
Ms. Nack,  It was a pleasure to see you today. Please increase your hydrochlorothiazide to 25 mg daily. You can take 2 pills of your current prescription until you run out and then start the higher dose 25 mg tablets. I will call you with the results of your blood work.  Please take your prescription for the shingles vaccine to your pharmacy.   Follow up with me again in 3 months for your blood pressure.  If you have any questions or concerns, call our clinic at [phone] or after hours call [phone] and ask for the internal medicine resident on call.   COVID-19 Vaccine Information can be found at: [email or web address].co.uk For questions related to vaccine distribution or appointments, please email [email or web address] or call [phone].    Dr. Philipp Ovens

## 2020-08-24 NOTE — Telephone Encounter (Signed)
error 

## 2020-08-25 ENCOUNTER — Encounter: Payer: Self-pay | Admitting: Internal Medicine

## 2020-08-25 LAB — BMP8+ANION GAP
Anion Gap: 17 mmol/L (ref 10.0–18.0)
BUN/Creatinine Ratio: 17 (ref 9–23)
BUN: 13 mg/dL (ref 6–24)
CO2: 21 mmol/L (ref 20–29)
Calcium: 9.5 mg/dL (ref 8.7–10.2)
Chloride: 102 mmol/L (ref 96–106)
Creatinine, Ser: 0.78 mg/dL (ref 0.57–1.00)
Glucose: 77 mg/dL (ref 65–99)
Potassium: 3.8 mmol/L (ref 3.5–5.2)
Sodium: 140 mmol/L (ref 134–144)
eGFR: 89 mL/min/{1.73_m2} (ref >59)

## 2020-08-25 MED ORDER — METHIMAZOLE 5 MG PO TABS: 5.0000 mg | ORAL_TABLET | Freq: Every day | ORAL | 1 refills | Status: DC

## 2020-08-25 NOTE — Assessment & Plan Note (Signed)
Patient has Graves' disease, currently well controlled.  Managed by endocrinology.  Last seen in January 2022.  She was euthyroid at that time with normal TSH, free T4, and free T3.  Currently taking methimazole 5 mg daily.  She is overdue for follow-up.  Instructed her to schedule an appointment with her endocrinologist.

## 2020-08-25 NOTE — Progress Notes (Signed)
Subjective:   Patient ID: Christy Burke female   DOB: October 20, 1960 60 y.o.   MRN: [id number]  HPI: [name] is a 60 y.o. female with past medical history outlined below here for follow-up of her chronic medical condition for the details of today's visit, please refer to the assessment and plan.   Past Medical History:  Diagnosis Date   Asthma    History of umbilical hernia    Renal stones    Current Outpatient Medications  Medication Sig Dispense Refill   buPROPion (WELLBUTRIN SR) 150 MG 12 hr tablet Take 1 tablet (150 mg total) by mouth 2 (two) times daily. 60 tablet 2   albuterol (VENTOLIN HFA) 108 (90 Base) MCG/ACT inhaler Inhale 2 puffs into the lungs every 6 (six) hours as needed for wheezing or shortness of breath. 1 Inhaler 0   Ascorbic Acid (VITAMIN C PO) Take 1 tablet by mouth daily. Reported on 06/13/2015     ELDERBERRY PO Take by mouth.     estradiol (ESTRACE) 1 MG tablet Take 1 tablet (1 mg total) by mouth daily. 90 tablet 4   hydrochlorothiazide (HYDRODIURIL) 25 MG tablet Take 1 tablet (25 mg total) by mouth daily. 90 tablet 1   ibuprofen (ADVIL,MOTRIN) 600 MG tablet Take 1 tablet (600 mg total) by mouth every 6 (six) hours as needed (mild pain). 20 tablet 0   methimazole (TAPAZOLE) 5 MG tablet Take 1 tablet (5 mg total) by mouth daily. 90 tablet 1   No current facility-administered medications for this visit.   Family History  Problem Relation Age of Onset   Cancer Mother    Diabetes Father    Colon cancer Neg Hx    Social History   Socioeconomic History   Marital status: Married    Spouse name: Not on file   Number of children: Not on file   Years of education: Not on file   Highest education level: Not on file  Occupational History   Not on file  Tobacco Use   Smoking status: Every Day    Packs/day: 0.50    Years: 39.00    Pack years: 19.50    Types: Cigarettes   Smokeless tobacco: Never   Tobacco comments:    Sometimes less.  Substance and  Sexual Activity   Alcohol use: No    Alcohol/week: 0.0 standard drinks    Comment: Quit 9 yrs ago.   Drug use: No    Types: Cocaine    Comment: Quit several years ago, never did IVDU only smoked cocaine   Sexual activity: Yes    Birth control/protection: Other-see comments    Comment: In same sex relationship for 19 years  Other Topics Concern   Not on file  Social History Narrative   Not on file   Social Determinants of Health   Financial Resource Strain: Not on file  Food Insecurity: Not on file  Transportation Needs: Not on file  Physical Activity: Not on file  Stress: Not on file  Social Connections: Not on file    Review of Systems: Review of Systems  Respiratory:  Negative for shortness of breath.   Cardiovascular:  Negative for chest pain and palpitations.  Psychiatric/Behavioral:  The patient is nervous/anxious and has insomnia.     Objective:  Physical Exam:  Vitals:   08/24/20 0949  BP: (!) 131/93  Pulse: 72  Temp: 98 F (36.7 C)  TempSrc: Oral  SpO2: 99%  Weight: 192 lb 12.8 oz (87.5 kg)  Height: 5\' 6"  (1.676 m)    Physical Exam Constitutional:      Appearance: Normal appearance.  Cardiovascular:     Rate and Rhythm: Normal rate and regular rhythm.     Heart sounds: No murmur heard. Pulmonary:     Effort: Pulmonary effort is normal. No respiratory distress.     Breath sounds: Normal breath sounds.  Musculoskeletal:     Right lower leg: No edema.     Left lower leg: No edema.  Skin:    General: Skin is warm and dry.  Neurological:     Mental Status: She is alert.  Psychiatric:        Mood and Affect: Mood normal.        Behavior: Behavior normal.     Assessment & Plan:   See Encounters Tab for problem based charting.

## 2020-08-25 NOTE — Assessment & Plan Note (Signed)
Patient given prescription for Shingrix.  Instructed to take it to her pharmacy.

## 2020-08-25 NOTE — Assessment & Plan Note (Signed)
Hemoglobin A1c is persistently in the prediabetic range.  Unchanged at 5.9 today.  Repeat in 1 year.

## 2020-08-25 NOTE — Assessment & Plan Note (Signed)
Patient is still actively smoking, but is motivated to quit.  She is feeling Chantix and nicotine patches previously.  She had a side effect of bad nightmares with Chantix and does not wish to retry this.  She has a friend who was successful in quitting with bupropion and she is interested in this. -- Start bupropion 150 mg BID

## 2020-08-25 NOTE — Assessment & Plan Note (Signed)
Blood pressure is persistently uncontrolled today.  This is a chronic issue.  She reports compliance with her hydrochlorothiazide 12.5 mg daily.  Plan to increase this to 25 mg daily, follow-up in 3 months.

## 2020-08-25 NOTE — Assessment & Plan Note (Signed)
Patient is dealing with multiple recent deaths in the family and a decline in her father's health.  She seems to be coping with this well.  Her response seems like an appropriate grief reaction.  Both her PHQ-9 and GAD-7 scores today were low.  This is not seem to be significantly impacting her ability to function.  She reports 1-2 nights a week having difficulty sleeping, otherwise no issues.  I provided supportive listening.  Offered counseling if she becomes interested in future.

## 2020-08-25 NOTE — Assessment & Plan Note (Signed)
Prevnar 20 given at today's visit.

## 2020-09-06 ENCOUNTER — Encounter: Payer: Self-pay | Admitting: *Deleted

## 2020-09-12 ENCOUNTER — Ambulatory Visit: Payer: BC Managed Care – PPO | Admitting: Internal Medicine

## 2020-09-12 ENCOUNTER — Encounter: Payer: Self-pay | Admitting: Internal Medicine

## 2020-09-12 ENCOUNTER — Other Ambulatory Visit: Payer: Self-pay

## 2020-09-12 VITALS — BP 128/78 | HR 76 | Ht 66.0 in | Wt 191.0 lb

## 2020-09-12 DIAGNOSIS — E0500 Thyrotoxicosis with diffuse goiter without thyrotoxic crisis or storm: Secondary | ICD-10-CM | POA: Diagnosis not present

## 2020-09-12 LAB — T3, FREE: T3, Free: 3.6 pg/mL (ref 2.3–4.2)

## 2020-09-12 LAB — TSH: TSH: 1.31 u[IU]/mL (ref 0.35–5.50)

## 2020-09-12 LAB — T4, FREE: Free T4: 0.69 ng/dL (ref 0.60–1.60)

## 2020-09-12 NOTE — Progress Notes (Signed)
Patient ID: Christy Burke, female   DOB: [birth year], 60 y.o.   MRN: [id number]   This visit occurred during the SARS-CoV-2 public health emergency.  Safety protocols were in place, including screening questions prior to the visit, additional usage of staff PPE, and extensive cleaning of exam room while observing appropriate contact time as indicated for disinfecting solutions.   HPI  Christy Burke is a 60 y.o.-year-old female, returning for follow-up for Graves' disease and Graves' ophthalmopathy.  Last visit 6 months ago.  Interim history: She feels well, without complaints at this visit. She was just prescribed Wellbutrin to help with smoking cessation >> did not start yet.   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 Labs remained abnormal 2 weeks later.  At that time, she was started on: - Methimazole 10 mg 3x a day - Propranolol 10 mg 2x a day  Retrospectively, she did have: - hot flushes (but had a hysterectomy) - L eye bulging - some blurry vision, but not diplopia, chemosis, pain - weight loss: 20 lbs - in last 4 mo - lack of appetite - tremors - palpitations - better on beta blockers - no hyperdefecation - no insomnia - + anxiety  In 03/2017, she was 3 times a day and Inderal 10 mg twice a day.  The symptoms resolved, except for hot flashes, for which she is on HRT.  We subsequently decreased the dose of methimazole and stopped Inderal.  However, she was not usually returning for last between appointments despite advised to do so. In 07/2018, her TSH was elevated, so I advised her to reduce the methimazole dose to 5 mg twice a day and come back for labs in 1.5 months.  She did not return...  In 03/2019, we decreased her methimazole dose further to 5 mg a.m. and 2.5 mg in p.m.  She did not return for labs... In 07/2019 she ran out of methimazole 5 days prior to the  appointment... We restarted 5 mg in am and 2.5 mg later in the day. In 11/2019, we decrease methimazole to 5 mg daily.  Reviewed her TFTs: Lab Results  Component Value Date   TSH 2.73 03/18/2020   TSH 2.35 11/13/2019   TSH 0.91 07/10/2019   TSH 2.66 03/11/2019   TSH 1.32 11/05/2018   TSH 11.77 (H) 07/16/2018   TSH 0.59 11/25/2017   TSH <0.01 Repeated and verified X2. (L) 07/26/2017   TSH <0.006 (L) 04/15/2017   TSH <0.010 (L) 04/02/2017   FREET4 0.66 03/18/2020   FREET4 0.69 11/13/2019   FREET4 0.80 07/10/2019   FREET4 0.65 03/11/2019   FREET4 0.77 11/05/2018   FREET4 0.50 (L) 07/16/2018   FREET4 0.63 11/25/2017   FREET4 1.10 07/26/2017   FREET4 3.04 (H) 04/15/2017   FREET4 2.34 (H) 04/02/2017   T3FREE 3.1 03/18/2020   T3FREE 3.5 11/13/2019   T3FREE 3.1 07/10/2019   T3FREE 2.9 03/11/2019   T3FREE 3.0 11/05/2018   T3FREE 2.9 07/16/2018   T3FREE 3.0 11/25/2017   T3FREE 3.6 07/26/2017   Her Graves' antibodies were elevated: Component     Latest Ref Rng & Units 04/15/2017  Thyrotropin Receptor Ab     0.00 - 1.75 IU/L 12.16 (H)   Lab Results  Component Value Date   TSI 236 (H) 03/11/2019   TSI 319 (H) 07/26/2017   Pt denies: - feeling nodules in neck -  hoarseness - dysphagia - choking - SOB with lying down  Pt does have a FH of thyroid ds.: 2nd cousin and niece with Graves ds. No FH of thyroid cancer. No h/o radiation tx to head or neck.  No recent contrast studies. No herbal supplements. No Biotin use. No recent steroids use.   Pt. also has a history of kidney stones in 2012, 2014, 2016, 2018.  She has a history of lithotripsy.  She does not have active Graves' ophthalmopathy.  She follows with ophthalmology every year. She  saw her eye dr. At [facility] >> small cataract.  She had TAH several years ago. She developed hot flushes after this.  ROS: Constitutional: no weight gain/no weight loss, no fatigue, + subjective hyperthermia, no subjective  hypothermia Eyes: no blurry vision, no xerophthalmia ENT: no sore throat, + see HPI Cardiovascular: no CP/no SOB/no palpitations/no leg swelling Respiratory: no cough/no SOB/no wheezing Gastrointestinal: no N/no V/no D/no C/no acid reflux Musculoskeletal: no muscle aches/no joint aches Skin: no rashes, no hair loss Neurological: no tremors/no numbness/no tingling/no dizziness  I reviewed pt's medications, allergies, PMH, social hx, family hx, and changes were documented in the history of present illness. Otherwise, unchanged from my initial visit note.  Past Medical History:  Diagnosis Date   Asthma    History of umbilical hernia    Renal stones    Past Surgical History:  Procedure Laterality Date   lithotrypsy     UMBILICAL HERNIA REPAIR  1990   Social History   Socioeconomic History   Marital status: Married    Spouse name: Not on file   Number of children: 105: 44 year old in 2019   Years of education: Not on file   Highest education level: Not on file  Occupational History    Transport planner  Social Needs   Financial resource strain: Not on file   Food insecurity:    Worry: Not on file    Inability: Not on file   Transportation needs:    Medical: Not on file    Non-medical: Not on file  Tobacco Use   Smoking status: Current Every Day Smoker    Packs/day: 1.00    Years: 39.00    Pack years: 39.00    Types: Cigarettes   Smokeless tobacco: Never Used   Tobacco comment: wants Nicotine gum. 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 1 Inhaler 0   Ascorbic Acid (VITAMIN C PO) Take 1 tablet by mouth daily. Reported on 06/13/2015     buPROPion (WELLBUTRIN SR) 150 MG 12 hr tablet Take 1 tablet (150 mg total) by mouth 2 (two) times daily. 60 tablet 2   hydrochlorothiazide (HYDRODIURIL) 25 MG tablet Take 1 tablet (25 mg total) by mouth daily. 90 tablet 1   methimazole (TAPAZOLE) 5 MG tablet Take 1 tablet (  5 mg total) by mouth daily. 90 tablet 1   estradiol (ESTRACE) 1 MG tablet Take 1 tablet (1 mg total) by mouth daily. (Patient not taking: Reported on 09/12/2020) 90 tablet 4   No current facility-administered medications on file prior to visit.   No Known Allergies Family History  Problem Relation Age of Onset   Cancer Mother    Diabetes Father    Colon cancer Neg Hx    PE: BP 128/78 (BP Location: Right Arm, Patient Position: Sitting, Cuff Size: Normal)   Pulse 76   Ht 5\' 6"  (1.676 m)   Wt 191 lb (86.6 kg)   LMP 03/14/2015 (Approximate)   SpO2 97%   BMI 30.83 kg/m  Wt Readings from Last 3 Encounters:  09/12/20 191 lb (86.6 kg)  08/24/20 192 lb 12.8 oz (87.5 kg)  03/18/20 189 lb 6.4 oz (85.9 kg)   Constitutional: overweight, in NAD Eyes: PERRLA, EOMI, no exophthalmos ENT: moist mucous membranes, + symmetric, nonnodular, thyromegaly, no cervical lymphadenopathy Cardiovascular: RRR, No MRG Respiratory: CTA B Gastrointestinal: abdomen soft, NT, ND, BS+ Musculoskeletal: no deformities, strength intact in all 4 Skin: moist, warm, no rashes Neurological: no tremor with outstretched hands, DTR normal in all 4  ASSESSMENT: 1. Graves ds.   2.  Graves' ophthalmopathy  PLAN:  1. Patient with history of enlarged thyroid discovered by patient when she was looking in the mirror in 03/2017.  Around that time, she was also told that her left eye was mildly  bulging.  She saw her PCP and she was found to have thyrotoxicosis.  Labs remained abnormal 2 weeks later and she was also found to have high thyrotropin receptor antibodies, confirming Graves' disease.  She was started on methimazole 10 mg 3 times a day and Inderal 10 mg twice a day. Retrospectively, she also noticed weight loss, heat intolerance, palpitations, tremors, increased appetite, anxiety.  Symptoms resolved after starting the above medications.  However, she was not compliant with her thyroid hormone checks and we could not adjust the dose of methimazole in a timely fashion.  In 03/2019, we decreased the methimazole dose and she again did not present for labs in 1.5 months after the dose change.  Also, in the past, she ran out of methimazole.  I strongly advised her not to run out of her medication again we started her back on 5 mg daily. -At this visit, she continues on 5 mg of methimazole daily, which she tolerates well. -No hypothyroid complaints at this visit.  She has hot flashes but these appeared after her TAH surgery, so most likely postmenopausal. -We will repeat her TFTs today and change the methimazole dose accordingly.  If labs are normal, I would be less prone to change her methimazole dose since she is preparing to start Wellbutrin now but will most likely be able to decrease the dose at next visit. -I we will see her back in 6 months  2.  Graves' ophthalmopathy -Her left eye exophthalmos has improved and it is not visible anymore -No blurry vision, diplopia, pain with eye movement, chemosis -Her TSI's were elevated in the past, but they decreased at last check -we will recheck these at next visit -She continues to follow-up with ophthalmology on a yearly basis  Component     Latest Ref Rng & Units 09/12/2020  TSH     0.35 - 5.50 uIU/mL 1.31  T4,Free(Direct)     0.60 - 1.60 ng/dL 0.69  Triiodothyronine,Free,Serum  2.3 - 4.2 pg/mL 3.6   Tests are normal.  However, as  discussed, we will continue the same dose of methimazole for now but will most likely be able to decrease the dose at next visit.  Philemon Kingdom, MD PhD [facility]

## 2020-09-12 NOTE — Patient Instructions (Signed)
Please continue methimazole 5 mg in a.m.  Please stop at the lab.  Please come back for a follow-up appointment in 6 months, but possibly sooner for labs.

## 2020-10-03 ENCOUNTER — Other Ambulatory Visit: Payer: Self-pay

## 2020-10-03 ENCOUNTER — Ambulatory Visit
Admission: EM | Admit: 2020-10-03 | Discharge: 2020-10-03 | Disposition: A | Discharge status: Home / Self Care | Payer: BC Managed Care – PPO | Attending: Urgent Care | Admitting: Urgent Care

## 2020-10-03 ENCOUNTER — Ambulatory Visit (INDEPENDENT_AMBULATORY_CARE_PROVIDER_SITE_OTHER): Payer: BC Managed Care – PPO

## 2020-10-03 ENCOUNTER — Encounter: Payer: Self-pay | Admitting: Emergency Medicine

## 2020-10-03 DIAGNOSIS — M25561 Pain in right knee: Secondary | ICD-10-CM

## 2020-10-03 DIAGNOSIS — M1990 Unspecified osteoarthritis, unspecified site: Secondary | ICD-10-CM

## 2020-10-03 DIAGNOSIS — I10 Essential (primary) hypertension: Secondary | ICD-10-CM

## 2020-10-03 MED ORDER — DICLOFENAC SODIUM 3 % EX GEL: 2.0000 g | Freq: Two times a day (BID) | CUTANEOUS | 0 refills | Status: DC

## 2020-10-03 MED ORDER — PREDNISONE 20 MG PO TABS: ORAL_TABLET | ORAL | 0 refills | Status: DC

## 2020-10-03 NOTE — ED Provider Notes (Signed)
[place]   MRN: [id number] DOB: 1960/11/15  Subjective:   Christy Burke is a 60 y.o. female presenting for 5-day history of acute onset right knee pain, difficulty bearing weight.  Symptoms worsened by movement.  Recently started working long hours, does 12-hour shifts and stands or walks for the entirety of her shift.  Denies fall, trauma, warmth, swelling, knee buckling.  No history of gout.  She does have a history of essential hypertension.  Has used ibuprofen without any relief.  She is also used a knee sleeve.  No current facility-administered medications for this encounter.  Current Outpatient Medications:    albuterol (VENTOLIN HFA) 108 (90 Base) MCG/ACT inhaler, Inhale 2 puffs into the lungs every 6 (six) hours as needed for wheezing or shortness of breath., Disp: 1 Inhaler, Rfl: 0   Ascorbic Acid (VITAMIN C PO), Take 1 tablet by mouth daily. Reported on 06/13/2015, Disp: , Rfl:    buPROPion (WELLBUTRIN SR) 150 MG 12 hr tablet, Take 1 tablet (150 mg total) by mouth 2 (two) times daily., Disp: 60 tablet, Rfl: 2   estradiol (ESTRACE) 1 MG tablet, Take 1 tablet (1 mg total) by mouth daily. (Patient not taking: Reported on 09/12/2020), Disp: 90 tablet, Rfl: 4   hydrochlorothiazide (HYDRODIURIL) 25 MG tablet, Take 1 tablet (25 mg total) by mouth daily., Disp: 90 tablet, Rfl: 1   methimazole (TAPAZOLE) 5 MG tablet, Take 1 tablet (5 mg total) by mouth daily., Disp: 90 tablet, Rfl: 1   No Known Allergies  Past Medical History:  Diagnosis Date   Asthma    History of umbilical hernia    Renal stones      Past Surgical History:  Procedure Laterality Date   lithotrypsy     UMBILICAL HERNIA REPAIR  1990    Family History  Problem Relation Age of Onset   Cancer Mother    Diabetes Father    Colon cancer Neg Hx     Social History   Tobacco Use   Smoking status: Every Day    Packs/day: 0.50    Years: 39.00    Pack years: 19.50    Types: Cigarettes   Smokeless  tobacco: Never   Tobacco comments:    Sometimes less.  Substance Use Topics   Alcohol use: No    Alcohol/week: 0.0 standard drinks    Comment: Quit 9 yrs ago.   Drug use: No    Types: Cocaine    Comment: Quit several years ago, never did IVDU only smoked cocaine    ROS   Objective:   Vitals: BP (!) 140/91 (BP Location: Left Arm)   Pulse 65   Temp 98.1 F (36.7 C) (Oral)   Resp 18   LMP 03/14/2015 (Approximate)   SpO2 97%   Physical Exam Constitutional:      General: She is not in acute distress.    Appearance: Normal appearance. She is well-developed. She is not ill-appearing, toxic-appearing or diaphoretic.  HENT:     Head: Normocephalic and atraumatic.     Nose: Nose normal.     Mouth/Throat:     Mouth: Mucous membranes are moist.     Pharynx: Oropharynx is clear.  Eyes:     General: No scleral icterus.       Right eye: No discharge.        Left eye: No discharge.     Extraocular Movements: Extraocular movements intact.     Conjunctiva/sclera: Conjunctivae normal.     Pupils:  Pupils are equal, round, and reactive to light.  Cardiovascular:     Rate and Rhythm: Normal rate.  Pulmonary:     Effort: Pulmonary effort is normal.  Musculoskeletal:     Right knee: Effusion present. No swelling, deformity, erythema, ecchymosis, lacerations, bony tenderness or crepitus. Decreased range of motion. Tenderness present over the medial joint line and lateral joint line. No patellar tendon tenderness. Normal alignment, normal meniscus and normal patellar mobility.  Skin:    General: Skin is warm and dry.  Neurological:     General: No focal deficit present.     Mental Status: She is alert and oriented to person, place, and time.     Motor: No weakness.     Coordination: Coordination abnormal (ambulates with a slight limp favoring right knee).     Gait: Gait normal.     Deep Tendon Reflexes: Reflexes normal.  Psychiatric:        Mood and Affect: Mood normal.         Behavior: Behavior normal.        Thought Content: Thought content normal.        Judgment: Judgment normal.    DG Knee Complete 4 Views Right  Result Date: 10/03/2020 CLINICAL DATA:  Right knee pain EXAM: RIGHT KNEE - COMPLETE 4+ VIEW COMPARISON:  None. FINDINGS: Degenerative changes in the right knee most pronounced in the patellofemoral compartment with joint space narrowing and spurring. Small joint effusion. No acute bony abnormality. Specifically, no fracture, subluxation, or dislocation. IMPRESSION: Degenerative changes with small joint effusion. No acute bony abnormality. Electronically Signed   By: Rolm Baptise M.D.   On: 10/03/2020 09:43     Assessment and Plan :   PDMP not reviewed this encounter.  1. Acute pain of right knee   2. Arthritis   3. Essential hypertension     Recommended an oral prednisone course.  Use RICE method.  Follow-up with Ortho soon as possible. Counseled patient on potential for adverse effects with medications prescribed/recommended today, ER and return-to-clinic precautions discussed, patient verbalized understanding.    Jaynee Eagles, PA-C 10/03/20 1009

## 2020-10-03 NOTE — ED Triage Notes (Signed)
Pt here for right knee pain starting 5 days ago after working 12 hour shift; denies obvious injury; pt sts painful with movement

## 2020-10-06 ENCOUNTER — Ambulatory Visit: Payer: BC Managed Care – PPO | Admitting: Family Medicine

## 2020-11-30 ENCOUNTER — Encounter: Payer: Self-pay | Admitting: Internal Medicine

## 2020-11-30 ENCOUNTER — Telehealth: Payer: Self-pay

## 2020-11-30 ENCOUNTER — Other Ambulatory Visit: Payer: Self-pay

## 2020-11-30 ENCOUNTER — Ambulatory Visit (INDEPENDENT_AMBULATORY_CARE_PROVIDER_SITE_OTHER): Payer: BC Managed Care – PPO | Admitting: Internal Medicine

## 2020-11-30 VITALS — BP 137/93 | HR 72 | Temp 98.3°F | Ht 66.0 in | Wt 194.4 lb

## 2020-11-30 DIAGNOSIS — Z23 Encounter for immunization: Secondary | ICD-10-CM

## 2020-11-30 DIAGNOSIS — M1711 Unilateral primary osteoarthritis, right knee: Secondary | ICD-10-CM | POA: Insufficient documentation

## 2020-11-30 DIAGNOSIS — Z716 Tobacco abuse counseling: Secondary | ICD-10-CM | POA: Diagnosis not present

## 2020-11-30 DIAGNOSIS — I10 Essential (primary) hypertension: Secondary | ICD-10-CM | POA: Diagnosis not present

## 2020-11-30 DIAGNOSIS — Z96651 Presence of right artificial knee joint: Secondary | ICD-10-CM | POA: Insufficient documentation

## 2020-11-30 MED ORDER — BUPROPION HCL ER (SR) 150 MG PO TB12: 150.0000 mg | ORAL_TABLET | Freq: Two times a day (BID) | ORAL | 5 refills | Status: DC

## 2020-11-30 MED ORDER — TRAMADOL HCL 50 MG PO TABS: 50.0000 mg | ORAL_TABLET | Freq: Two times a day (BID) | ORAL | 0 refills | Status: DC | PRN

## 2020-11-30 NOTE — Progress Notes (Signed)
Subjective:   Patient ID: Christy Burke female   DOB: [birth year] 60 y.o.   MRN: [id number]  HPI: [name] is a 60 y.o. female with past medical history outlined below here for HTN and knee pain. For the details of today's visit, please refer to the assessment and plan.   Past Medical History:  Diagnosis Date   Asthma    History of umbilical hernia    Renal stones    Current Outpatient Medications  Medication Sig Dispense Refill   traMADol (ULTRAM) 50 MG tablet Take 1 tablet (50 mg total) by mouth 2 (two) times daily as needed. 60 tablet 0   albuterol (VENTOLIN HFA) 108 (90 Base) MCG/ACT inhaler Inhale 2 puffs into the lungs every 6 (six) hours as needed for wheezing or shortness of breath. 1 Inhaler 0   Ascorbic Acid (VITAMIN C PO) Take 1 tablet by mouth daily. Reported on 06/13/2015     buPROPion (WELLBUTRIN SR) 150 MG 12 hr tablet Take 1 tablet (150 mg total) by mouth 2 (two) times daily. 60 tablet 5   Diclofenac Sodium 3 % GEL Apply 2 g topically 2 (two) times daily. 100 g 0   estradiol (ESTRACE) 1 MG tablet Take 1 tablet (1 mg total) by mouth daily. (Patient not taking: Reported on 09/12/2020) 90 tablet 4   hydrochlorothiazide (HYDRODIURIL) 25 MG tablet Take 1 tablet (25 mg total) by mouth daily. 90 tablet 1   meloxicam (MOBIC) 15 MG tablet Take 15 mg by mouth daily.     methimazole (TAPAZOLE) 5 MG tablet Take 1 tablet (5 mg total) by mouth daily. 90 tablet 1   No current facility-administered medications for this visit.   Family History  Problem Relation Age of Onset   Cancer Mother    Diabetes Father    Colon cancer Neg Hx    Social History   Socioeconomic History   Marital status: Married    Spouse name: Not on file   Number of children: Not on file   Years of education: Not on file   Highest education level: Not on file  Occupational History   Not on file  Tobacco Use   Smoking status: Every Day    Packs/day: 0.50    Years: 39.00    Pack years: 19.50     Types: Cigarettes   Smokeless tobacco: Never   Tobacco comments:    Sometimes less.  Substance and Sexual Activity   Alcohol use: No    Alcohol/week: 0.0 standard drinks    Comment: Quit 9 yrs ago.   Drug use: No    Types: Cocaine    Comment: Quit several years ago, never did IVDU only smoked cocaine   Sexual activity: Yes    Birth control/protection: Other-see comments    Comment: In same sex relationship for 19 years  Other Topics Concern   Not on file  Social History Narrative   Not on file   Social Determinants of Health   Financial Resource Strain: Not on file  Food Insecurity: Not on file  Transportation Needs: Not on file  Physical Activity: Not on file  Stress: Not on file  Social Connections: Not on file    Review of Systems: Review of Systems  Musculoskeletal:  Positive for joint pain.       Right knee pain and swelling     Objective:  Physical Exam:  Vitals:   11/30/20 0959 11/30/20 1038  BP: (!) 125/93 (!) 137/93  Pulse: 76 72  Temp: 98.3 F (36.8 C)   TempSrc: Oral   SpO2: 100%   Weight: 194 lb 6.4 oz (88.2 kg)   Height: 5\' 6"  (1.676 m)     Physical Exam Constitutional:      Appearance: Normal appearance.  Cardiovascular:     Rate and Rhythm: Normal rate and regular rhythm.  Pulmonary:     Effort: Pulmonary effort is normal.     Breath sounds: Normal breath sounds.  Musculoskeletal:     Comments: Moderate right knee effusion, limited ROM due to pain. Mild crepitus.   Skin:    General: Skin is warm and dry.  Neurological:     General: No focal deficit present.     Mental Status: She is alert and oriented to person, place, and time.  Psychiatric:        Mood and Affect: Mood normal.        Behavior: Behavior normal.     Assessment & Plan:   See Encounters Tab for problem based charting.

## 2020-11-30 NOTE — Assessment & Plan Note (Signed)
Patient recently established with orthopedic surgery due to right knee pain. She was initially seen in the ED on 10/03/20 and had plain films that showed degenerative changes with a small joint effusion. She was subsequently seen for follow up by othropedic surgery and underwent an arthrocentesis with a steroid injection. She has also been taking meloxicam. Unfortunately she has not found relief and continues to have pain. She works a physically demanding job at [employer] and is having a hard time keeping up with her job duties. She works 12 hour shifts and is on her feet for extended periods of time. She was offered a total knee replacement, however her wife is currently dealing with a work related injury and underwent surgery for a rotator cuff repair. She is helping care for her wife at home and was told that her wife also needs a total shoulder replacement. They are planning to prioritize her wife's surgery first. We discussed the risks and benefits of opioid pain medication for treatment of OA. Patient does not drink alcohol or use illicit substances and I feel she is appropriate for a trial of tramadol since she has failed other non opioid therapies. She understands this is only a temporary to bridge to her surgery and not a long term solution. Will start with low dose tramadol 50 mg BID prn. I counseled her not to drive or operate heavy machinery at work while taking this medication and she expressed understanding. On exam today, she has a moderate knee effusion with crepitus and limited ROM. I offered her repeat arthrocentesis but she declined. She did not find the first arthrocentesis helpful and worries it will just re accumulate. I instructed her to continue with meloxicam in addition to tramadol. Follow up 1 month. If doing well on this we can plan for a utox and controlled substance contract.  -- Start tramadol 50 mg BID; follow up 1 month -- Continue meloxicam; checking BMP  -- Obtain  records from [employer]

## 2020-11-30 NOTE — Assessment & Plan Note (Signed)
Blood pressure improved this visit after increased HCTZ at her last visit, 125/93 but increased on recheck to 137/93. She reports compliance with her HCTZ 25 mg daily. Plan to follow up 1 month. If persistently elevated, consider adding amlodipine or changing to a combination pill. Checking BMP today.

## 2020-11-30 NOTE — Progress Notes (Signed)
ROI form signed by pt - faxed to [facility] for records.

## 2020-11-30 NOTE — Telephone Encounter (Signed)
PA for pt ( TRAMADOL ) came through on cover my meds was sent in awaiting approval or denial

## 2020-11-30 NOTE — Assessment & Plan Note (Signed)
Doing very well with Wellbutrin. She has cut back from over a pack to only a few cigarettes a day. She feels the Wellbutrin helps significantly to reduce cravings. Plan to continue this, refills sent to pharmacy.

## 2020-11-30 NOTE — Patient Instructions (Signed)
Ms. Giles,  It was a pleasure to see you. I am sorry to hear about your knee. Please follow up with me again in 1 month. Continue taking your medicaitons as prescribed. I will call you if anything is abnormal on your blood work.  If you have any questions or concerns, call our clinic at [phone] or after hours call [phone] and ask for the internal medicine resident on call.   Thank you!  Dr. Darnell Level

## 2020-12-01 LAB — BMP8+ANION GAP
Anion Gap: 18 mmol/L (ref 10.0–18.0)
BUN/Creatinine Ratio: 12 (ref 9–23)
BUN: 9 mg/dL (ref 6–24)
CO2: 23 mmol/L (ref 20–29)
Calcium: 9.9 mg/dL (ref 8.7–10.2)
Chloride: 101 mmol/L (ref 96–106)
Creatinine, Ser: 0.75 mg/dL (ref 0.57–1.00)
Glucose: 72 mg/dL (ref 70–99)
Potassium: 3.9 mmol/L (ref 3.5–5.2)
Sodium: 142 mmol/L (ref 134–144)
eGFR: 92 mL/min/{1.73_m2} (ref >59)

## 2020-12-05 NOTE — Telephone Encounter (Signed)
DECISION :   Approvedon September 28 Your PA request has been approved. 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

## 2020-12-07 ENCOUNTER — Encounter: Payer: BC Managed Care – PPO | Admitting: Student

## 2020-12-07 NOTE — Telephone Encounter (Signed)
PA APPROVED  FROM 11/30/20 until 11/29/2021

## 2020-12-14 ENCOUNTER — Other Ambulatory Visit: Payer: Self-pay

## 2020-12-14 ENCOUNTER — Encounter: Payer: Self-pay | Admitting: Internal Medicine

## 2020-12-14 ENCOUNTER — Ambulatory Visit: Payer: BC Managed Care – PPO | Admitting: Internal Medicine

## 2020-12-14 VITALS — BP 145/97 | HR 64 | Temp 97.9°F | Ht 66.0 in | Wt 190.5 lb

## 2020-12-14 DIAGNOSIS — M1711 Unilateral primary osteoarthritis, right knee: Secondary | ICD-10-CM

## 2020-12-14 MED ORDER — NAPROXEN 500 MG PO TABS: 500.0000 mg | ORAL_TABLET | Freq: Two times a day (BID) | ORAL | 0 refills | Status: DC

## 2020-12-14 MED ORDER — HYDROCODONE-ACETAMINOPHEN 5-325 MG PO TABS: 1.0000 | ORAL_TABLET | Freq: Every day | ORAL | 0 refills | Status: DC | PRN

## 2020-12-14 NOTE — Patient Instructions (Addendum)
Christy Burke,  Please schedule an appointment with your orthopedic surgeon for your knee.   Please stop taking mobic and tramadol. Instead, please take naproxen twice a day with breakfast and dinner for 2 weeks. You can take norco (aka hydrocodone) at night as needed before bed.   Follow up with me as needed if your pain does not improve.  If you have any questions or concerns, call our clinic at [phone] or after hours call [phone] and ask for the internal medicine resident on call. Thank you!  Dr. Darnell Level

## 2020-12-15 ENCOUNTER — Encounter: Payer: Self-pay | Admitting: Internal Medicine

## 2020-12-15 NOTE — Assessment & Plan Note (Signed)
Patient is here for follow up of her right knee pain and effusion. She was offered arthrocentesis at her last OV but declined. She returns today due to persistent pain and is now requesting arthrocentesis. 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 She works 12 hour shifts at a [employer]. On exam today, her effusion is actually much improved from her last visit. She has a slight suprapatellar knee effusion on the right which I was also able to appreciate on POC ultrasound. Unfortunately arthrocentesis was unsuccessful. I was unable to aspirate any synovial fluid. She reports no relief with tramadol and says the pain is keeping her awake at night. She is also taking mobic daily. Plan to d/c this, and start a trial of scheduled high dose naproxen, 500 mg BID AC. I have also sent an Rx for hydrocodone to be taken at night PRN before bed. If pain persists, I have asked her to follow up with her orthopedic surgeon to readdress total knee arthroplasty.  -- Stop mobic and tramadol -- Start Naproxen 500 mg BID AC x 2 weeks, scheduled -- Start Norco 5-325 mg QHS PRN  -- Work note provided. Instructed to rest for the remainder of the week. Can return on Monday if pain is improving.   Procedure Note   Indication:  Right knee effusion    Operators: Dr. Philipp Ovens   The patient was provided with risks, benefits, and alternatives to arthrocentesis. She consented to arthrocentesis for her right knee effusion.  After a time out was preformed, the knee was prepped in a sterile fashion. 2 cc of 1% lidocaine were used to numb the insertion site (superior lateral patellar aspect of the knee while in the supine position). A 21 gauge 1 and 1/2 inch needle was used to enter the joint space, unfortunately no synovial fluid was aspirated. The needle was removed and second attempt was not tried per patient request. There were no immediate complications.

## 2020-12-15 NOTE — Progress Notes (Signed)
Subjective:   Patient ID: Christy Burke female   DOB: Nov 08, 1960 60 y.o.   MRN: [id number]  HPI: [name] is a 60 y.o. female with past medical history outlined below here for right knee pain. For the details of today's visit, please refer to the assessment and plan.   Past Medical History:  Diagnosis Date   Asthma    History of umbilical hernia    Renal stones    Current Outpatient Medications  Medication Sig Dispense Refill   HYDROcodone-acetaminophen (NORCO) 5-325 MG tablet Take 1 tablet by mouth daily as needed for moderate pain. 30 tablet 0   naproxen (NAPROSYN) 500 MG tablet Take 1 tablet (500 mg total) by mouth 2 (two) times daily with a meal. 28 tablet 0   albuterol (VENTOLIN HFA) 108 (90 Base) MCG/ACT inhaler Inhale 2 puffs into the lungs every 6 (six) hours as needed for wheezing or shortness of breath. 1 Inhaler 0   Ascorbic Acid (VITAMIN C PO) Take 1 tablet by mouth daily. Reported on 06/13/2015     buPROPion (WELLBUTRIN SR) 150 MG 12 hr tablet Take 1 tablet (150 mg total) by mouth 2 (two) times daily. 60 tablet 5   Diclofenac Sodium 3 % GEL Apply 2 g topically 2 (two) times daily. 100 g 0   estradiol (ESTRACE) 1 MG tablet Take 1 tablet (1 mg total) by mouth daily. (Patient not taking: Reported on 09/12/2020) 90 tablet 4   hydrochlorothiazide (HYDRODIURIL) 25 MG tablet Take 1 tablet (25 mg total) by mouth daily. 90 tablet 1   methimazole (TAPAZOLE) 5 MG tablet Take 1 tablet (5 mg total) by mouth daily. 90 tablet 1   No current facility-administered medications for this visit.   Family History  Problem Relation Age of Onset   Cancer Mother    Diabetes Father    Colon cancer Neg Hx    Social History   Socioeconomic History   Marital status: Married    Spouse name: Not on file   Number of children: Not on file   Years of education: Not on file   Highest education level: Not on file  Occupational History   Not on file  Tobacco Use   Smoking status: Every Day     Packs/day: 0.50    Years: 39.00    Pack years: 19.50    Types: Cigarettes   Smokeless tobacco: Never   Tobacco comments:    Sometimes less.  Substance and Sexual Activity   Alcohol use: No    Alcohol/week: 0.0 standard drinks    Comment: Quit 9 yrs ago.   Drug use: No    Types: Cocaine    Comment: Quit several years ago, never did IVDU only smoked cocaine   Sexual activity: Yes    Birth control/protection: Other-see comments    Comment: In same sex relationship for 19 years  Other Topics Concern   Not on file  Social History Narrative   Not on file   Social Determinants of Health   Financial Resource Strain: Not on file  Food Insecurity: Not on file  Transportation Needs: Not on file  Physical Activity: Not on file  Stress: Not on file  Social Connections: Not on file    Review of Systems: Review of Systems  Constitutional:  Negative for fever.  Musculoskeletal:  Positive for joint pain.    Objective:  Physical Exam:  Vitals:   12/14/20 1014  BP: (!) 145/97  Pulse: 64  Temp: 97.9 F (36.6  C)  TempSrc: Oral  SpO2: 100%  Weight: 190 lb 8 oz (86.4 kg)  Height: 5\' 6"  (1.676 m)    Physical Exam Constitutional:      Appearance: Normal appearance.  Musculoskeletal:     Right lower leg: No edema.     Left lower leg: No edema.     Comments: Right knee with slight suprapatellar joint effusion. No warmth or erythema.   Skin:    General: Skin is warm and dry.  Neurological:     Mental Status: She is alert.  Psychiatric:        Mood and Affect: Mood normal.        Behavior: Behavior normal.     Assessment & Plan:   See Encounters Tab for problem based charting.

## 2020-12-28 ENCOUNTER — Encounter: Payer: BC Managed Care – PPO | Admitting: Internal Medicine

## 2020-12-29 ENCOUNTER — Telehealth: Payer: Self-pay

## 2020-12-29 NOTE — Telephone Encounter (Signed)
Called pt today, no answer. Left message for pt to call back. Want to informed pt, FMLA has been completed and Faxed. The original form will be at the front inside the cabin.

## 2021-01-02 ENCOUNTER — Other Ambulatory Visit: Payer: Self-pay | Admitting: Internal Medicine

## 2021-01-02 DIAGNOSIS — I10 Essential (primary) hypertension: Secondary | ICD-10-CM

## 2021-01-02 DIAGNOSIS — M1711 Unilateral primary osteoarthritis, right knee: Secondary | ICD-10-CM

## 2021-01-03 NOTE — Telephone Encounter (Signed)
HYDROcodone-acetaminophen (NORCO) 5-325 MG tablet  hydrochlorothiazide (HYDRODIURIL) 25 MG tablet  naproxen (NAPROSYN) 500 MG tablet, refill request @ [facility] ([facility]), [place].

## 2021-01-03 NOTE — Telephone Encounter (Signed)
Hydrocodone Last rx written  12/14/20. Last OV  12/14/20. Next OV has not been scheduled. UDS

## 2021-01-06 MED ORDER — HYDROCODONE-ACETAMINOPHEN 5-325 MG PO TABS: 1.0000 | ORAL_TABLET | Freq: Every day | ORAL | 0 refills | Status: DC | PRN

## 2021-01-06 NOTE — Telephone Encounter (Signed)
This is my first time receiving these refill request. Approved Rxs.

## 2021-01-06 NOTE — Telephone Encounter (Signed)
Hme, Patient called and wants to speak with you about something specific on the FMLA forms.  Please reach out to her. Thank you, SChaplin, RN,BSN

## 2021-02-04 ENCOUNTER — Other Ambulatory Visit: Payer: Self-pay | Admitting: Internal Medicine

## 2021-02-04 DIAGNOSIS — M1711 Unilateral primary osteoarthritis, right knee: Secondary | ICD-10-CM

## 2021-02-08 ENCOUNTER — Other Ambulatory Visit: Payer: Self-pay

## 2021-02-08 DIAGNOSIS — M1711 Unilateral primary osteoarthritis, right knee: Secondary | ICD-10-CM

## 2021-02-08 MED ORDER — HYDROCODONE-ACETAMINOPHEN 5-325 MG PO TABS: 1.0000 | ORAL_TABLET | Freq: Every day | ORAL | 0 refills | Status: DC | PRN

## 2021-02-08 NOTE — Telephone Encounter (Signed)
Front office - pt needs f/u appt per Dr Philipp Ovens for further refills Thanks

## 2021-02-08 NOTE — Telephone Encounter (Signed)
Approved 1 month refill of norco. Please ask her to schedule follow up with me for further refills.

## 2021-02-09 NOTE — Telephone Encounter (Signed)
Patient has been give first available appointment with Dr. Philipp Ovens on 03/22/2021 at 8:45 am.  Appointment card has been mailed to patient.

## 2021-02-28 ENCOUNTER — Other Ambulatory Visit: Payer: Self-pay | Admitting: Internal Medicine

## 2021-02-28 DIAGNOSIS — M1711 Unilateral primary osteoarthritis, right knee: Secondary | ICD-10-CM

## 2021-03-15 ENCOUNTER — Other Ambulatory Visit: Payer: Self-pay | Admitting: Internal Medicine

## 2021-03-15 ENCOUNTER — Ambulatory Visit: Payer: BC Managed Care – PPO | Admitting: Internal Medicine

## 2021-03-15 DIAGNOSIS — M1711 Unilateral primary osteoarthritis, right knee: Secondary | ICD-10-CM

## 2021-03-15 NOTE — Progress Notes (Deleted)
Patient ID: Christy Burke, female   DOB: Jul 22, 1960, 61 y.o.   MRN: [id number]   This visit occurred during the SARS-CoV-2 public health emergency.  Safety protocols were in place, including screening questions prior to the visit, additional usage of staff PPE, and extensive cleaning of exam room while observing appropriate contact time as indicated for disinfecting solutions.   HPI  Christy Burke is a 61 y.o.-year-old female, returning for follow-up for Graves' disease and Graves' ophthalmopathy.  Last visit 6 months ago.  Interim history: She feels well, without complaints at this visit.  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 Labs remained abnormal 2 weeks later.  At that time, she was started on: - Methimazole 10 mg 3x a day - Propranolol 10 mg 2x a day  Retrospectively, she did have: - hot flushes (but had a hysterectomy) - L eye bulging - some blurry vision, but not diplopia, chemosis, pain - weight loss: 20 lbs - in last 4 mo - lack of appetite - tremors - palpitations - better on beta blockers - no hyperdefecation - no insomnia - + anxiety  In 03/2017, she was 3 times a day and Inderal 10 mg twice a day.  The symptoms resolved, except for hot flashes, for which she is on HRT.  We subsequently decreased the dose of methimazole and stopped Inderal.  However, she was not usually returning for last between appointments despite advised to do so. In 07/2018, her TSH was elevated, so I advised her to reduce the methimazole dose to 5 mg twice a day and come back for labs in 1.5 months.  She did not return...  In 03/2019, we decreased her methimazole dose further to 5 mg a.m. and 2.5 mg in p.m.  She did not return for labs... In 07/2019 she ran out of methimazole 5 days prior to the appointment... We restarted 5 mg in am and 2.5 mg later in the day. In 11/2019, we decreased  methimazole to 5 mg daily.  Reviewed her TFTs: Lab Results  Component Value Date   TSH 1.31 09/12/2020   TSH 2.73 03/18/2020   TSH 2.35 11/13/2019   TSH 0.91 07/10/2019   TSH 2.66 03/11/2019   TSH 1.32 11/05/2018   TSH 11.77 (H) 07/16/2018   TSH 0.59 11/25/2017   TSH <0.01 Repeated and verified X2. (L) 07/26/2017   TSH <0.006 (L) 04/15/2017   FREET4 0.69 09/12/2020   FREET4 0.66 03/18/2020   FREET4 0.69 11/13/2019   FREET4 0.80 07/10/2019   FREET4 0.65 03/11/2019   FREET4 0.77 11/05/2018   FREET4 0.50 (L) 07/16/2018   FREET4 0.63 11/25/2017   FREET4 1.10 07/26/2017   FREET4 3.04 (H) 04/15/2017   T3FREE 3.6 09/12/2020   T3FREE 3.1 03/18/2020   T3FREE 3.5 11/13/2019   T3FREE 3.1 07/10/2019   T3FREE 2.9 03/11/2019   T3FREE 3.0 11/05/2018   T3FREE 2.9 07/16/2018   T3FREE 3.0 11/25/2017   T3FREE 3.6 07/26/2017   Her Graves' antibodies were elevated: Component     Latest Ref Rng & Units 04/15/2017  Thyrotropin Receptor Ab     0.00 - 1.75 IU/L 12.16 (H)   Lab Results  Component Value Date   TSI 236 (H) 03/11/2019   TSI 319 (H) 07/26/2017   Pt denies: - feeling nodules in neck - hoarseness - dysphagia - choking - SOB with lying down  Pt does  have a FH of thyroid ds.: 2nd cousin and niece with Graves ds. No FH of thyroid cancer. No h/o radiation tx to head or neck. No recent contrast studies. No herbal supplements. No Biotin use. No recent steroids use.   Pt. also has a history of kidney stones in 2012, 2014, 2016, 2018.  She has a history of lithotripsy.  She does not have active Graves' ophthalmopathy.  She follows with ophthalmology every year. She  saw her eye dr. At [facility] >> small cataract.  She had TAH several years ago. She developed hot flushes after this.  ROS: + see HPI  I reviewed pt's medications, allergies, PMH, social hx, family hx, and changes were documented in the history of present illness. Otherwise, unchanged from my initial visit  note.  Past Medical History:  Diagnosis Date   Asthma    History of umbilical hernia    Renal stones    Past Surgical History:  Procedure Laterality Date   lithotrypsy     UMBILICAL HERNIA REPAIR  1990   Social History   Socioeconomic History   Marital status: Married    Spouse name: Not on file   Number of children: 16: 7 year old in 2019   Years of education: Not on file   Highest education level: Not on file  Occupational History    Transport planner  Social Needs   Financial resource strain: Not on file   Food insecurity:    Worry: Not on file    Inability: Not on file   Transportation needs:    Medical: Not on file    Non-medical: Not on file  Tobacco Use   Smoking status: Current Every Day Smoker    Packs/day: 1.00    Years: 39.00    Pack years: 39.00    Types: Cigarettes   Smokeless tobacco: Never Used   Tobacco comment: wants Nicotine gum. 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 1 Inhaler 0   Ascorbic Acid (VITAMIN C PO) Take 1 tablet by mouth daily. Reported on 06/13/2015     buPROPion (WELLBUTRIN SR) 150 MG 12 hr tablet Take 1 tablet (150 mg total) by mouth 2 (two) times daily. 60 tablet 5   Diclofenac Sodium 3 % GEL Apply 2 g topically 2 (two) times daily. 100 g 0   estradiol (ESTRACE) 1 MG tablet Take 1 tablet (1 mg total) by mouth daily. (Patient not taking: Reported on  09/12/2020) 90 tablet 4   hydrochlorothiazide (HYDRODIURIL) 25 MG tablet Take 1 tablet by mouth once daily 90 tablet 3   HYDROcodone-acetaminophen (NORCO) 5-325 MG tablet Take 1 tablet by mouth daily as needed for moderate pain. 30 tablet 0   methimazole (TAPAZOLE) 5 MG tablet Take 1 tablet (5 mg total) by mouth daily. 90 tablet 1   naproxen (NAPROSYN) 500  MG tablet TAKE 1 TABLET BY MOUTH TWICE DAILY WITH A MEAL 28 tablet 0   No current facility-administered medications on file prior to visit.   No Known Allergies Family History  Problem Relation Age of Onset   Cancer Mother    Diabetes Father    Colon cancer Neg Hx    PE: LMP 03/14/2015 (Approximate)  Wt Readings from Last 3 Encounters:  12/14/20 190 lb 8 oz (86.4 kg)  11/30/20 194 lb 6.4 oz (88.2 kg)  09/12/20 191 lb (86.6 kg)   Constitutional: overweight, in NAD Eyes: PERRLA, EOMI, no exophthalmos ENT: moist mucous membranes, + symmetric, nonnodular, thyromegaly, no cervical lymphadenopathy Cardiovascular: RRR, No MRG Respiratory: CTA B Musculoskeletal: no deformities, strength intact in all 4 Skin: moist, warm, no rashes Neurological: no tremor with outstretched hands, DTR normal in all 4  ASSESSMENT: 1. Graves ds.   2.  Graves' ophthalmopathy  PLAN:  1. Patient with history of an enlarged thyroid discovered by patient when she was looking in the mirror in 03/2017.  Around that time, she was found to have thyrotoxicosis.  Thyrotropin receptor antibodies were also elevated, currently Graves' disease.  She was started on methimazole 10 mg once a day and Inderal 10 mg twice a day.  Retrospectively, she also noticed weight loss, heat intolerance, palpitations, tremors, increased appetite, anxiety.  Symptoms resolved after starting the above medications.  However, she was not compliant with her thyroid hormone checks and we could not adjust the methimazole dose in a timely fashion.  In 03/2019, we decreased the methimazole dose and  she again did not present for labs as recommended.  Also, she ran out of methimazole.  I strongly advised her against these practices.  As of now, she is on 5 mg of methimazole, which we continued at last visit.  She tolerates this well. -No hyperthyroid complaints at this visit.  She has a history of hot flashes, but they developed after her TAH surgery, so they are most likely postmenopausal -We will repeat her TFTs today and change the methimazole dose accordingly. -I am hoping that we can increase the dose of methimazole after the results returned -I we will see her back in 1 year, but with labs in 6 months  2.  Graves' ophthalmopathy -Her Let's exophthalmos is improved and it is not visible anymore -She denies blurry vision, double vision, pain with eye movement, chemosis -Her TSI antibodies were elevated in the past but they decreased at last check -We will repeat this today -She continues to follow-up with ophthalmology on a yearly basis  Philemon Kingdom, MD PhD [facility]

## 2021-03-15 NOTE — Telephone Encounter (Signed)
Refill Request   HYDROcodone-acetaminophen (NORCO) 5-325 MG tablet   [place] ([facility]), [place] - [address] (Ph: [phone]

## 2021-03-15 NOTE — Telephone Encounter (Signed)
Last rx written 02/28/21. Last OV 12/14/20. Next OV 03/22/21. UDS 12/21/14.

## 2021-03-15 NOTE — Telephone Encounter (Signed)
Refill declined. She was given a 1 x refill last month with instructions to follow up in person. She canceled her appointment. Needs to be seen for further refills. I will address at her next appointment on 1/18.

## 2021-03-17 ENCOUNTER — Encounter: Payer: Self-pay | Admitting: Internal Medicine

## 2021-03-17 ENCOUNTER — Ambulatory Visit (INDEPENDENT_AMBULATORY_CARE_PROVIDER_SITE_OTHER): Payer: BC Managed Care – PPO | Admitting: Internal Medicine

## 2021-03-17 ENCOUNTER — Other Ambulatory Visit: Payer: Self-pay

## 2021-03-17 VITALS — BP 118/82 | HR 70 | Ht 66.0 in | Wt 193.4 lb

## 2021-03-17 DIAGNOSIS — E0500 Thyrotoxicosis with diffuse goiter without thyrotoxic crisis or storm: Secondary | ICD-10-CM | POA: Diagnosis not present

## 2021-03-17 LAB — T4, FREE: Free T4: 0.8 ng/dL (ref 0.60–1.60)

## 2021-03-17 LAB — TSH: TSH: 1.33 u[IU]/mL (ref 0.35–5.50)

## 2021-03-17 LAB — T3, FREE: T3, Free: 3.9 pg/mL (ref 2.3–4.2)

## 2021-03-17 NOTE — Patient Instructions (Addendum)
Please continue methimazole 5 mg in a.m.  Please stop at the lab.  Please come back for a follow-up appointment in 1 year, but sooner for labs.

## 2021-03-17 NOTE — Progress Notes (Signed)
Patient ID: Christy Burke, female   DOB: 1960-07-20, 61 y.o.   MRN: [id number]   This visit occurred during the SARS-CoV-2 public health emergency.  Safety protocols were in place, including screening questions prior to the visit, additional usage of staff PPE, and extensive cleaning of exam room while observing appropriate contact time as indicated for disinfecting solutions.   HPI  Christy Burke is a 61 y.o.-year-old female, returning for follow-up for Graves' disease and Graves' ophthalmopathy.  Last visit 6 months ago.  Interim history: She feels well, without complaints at this visit. She has itchy eyes.  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  Wears eye protectors and the mask. She had pain in R knee >> s/p steroid inj's last year >> did not help. Started Meloxicam >> did not help. Now on Naproxen >> works well.  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 Labs remained abnormal 2 weeks later.  At that time, she was started on: - Methimazole 10 mg 3x a day - Propranolol 10 mg 2x a day  Retrospectively, she did have: - hot flushes (but had a hysterectomy) - L eye bulging - some blurry vision, but not diplopia, chemosis, pain - weight loss: 20 lbs - in last 4 mo - lack of appetite - tremors - palpitations - better on beta blockers - no hyperdefecation - no insomnia - + anxiety  In 03/2017, she was 3 times a day and Inderal 10 mg twice a day.  The symptoms resolved, except for hot flashes, for which she is on HRT.  We subsequently decreased the dose of methimazole and stopped Inderal.  However, she was not usually returning for last between appointments despite advised to do so. In 07/2018, her TSH was elevated, so I advised her to reduce the methimazole dose to 5 mg twice a day and come back for labs in 1.5 months.  She did not return...  In  03/2019, we decreased her methimazole dose further to 5 mg a.m. and 2.5 mg in p.m.  She did not return for labs... In 07/2019 she ran out of methimazole 5 days prior to the appointment... We restarted 5 mg in am and 2.5 mg later in the day. In 11/2019, we decreased methimazole to 5 mg daily.  Reviewed her TFTs: Lab Results  Component Value Date   TSH 1.31 09/12/2020   TSH 2.73 03/18/2020   TSH 2.35 11/13/2019   TSH 0.91 07/10/2019   TSH 2.66 03/11/2019   TSH 1.32 11/05/2018   TSH 11.77 (H) 07/16/2018   TSH 0.59 11/25/2017   TSH <0.01 Repeated and verified X2. (L) 07/26/2017   TSH <0.006 (L) 04/15/2017   FREET4 0.69 09/12/2020   FREET4 0.66 03/18/2020   FREET4 0.69 11/13/2019   FREET4 0.80 07/10/2019   FREET4 0.65 03/11/2019   FREET4 0.77 11/05/2018   FREET4 0.50 (L) 07/16/2018   FREET4 0.63 11/25/2017   FREET4 1.10 07/26/2017   FREET4 3.04 (H) 04/15/2017   T3FREE 3.6 09/12/2020   T3FREE 3.1 03/18/2020   T3FREE 3.5 11/13/2019   T3FREE 3.1 07/10/2019   T3FREE 2.9 03/11/2019   T3FREE 3.0 11/05/2018   T3FREE 2.9 07/16/2018   T3FREE 3.0 11/25/2017   T3FREE 3.6 07/26/2017   Her Graves' antibodies were elevated: Component     Latest Ref Rng & Units 04/15/2017  Thyrotropin Receptor Ab  0.00 - 1.75 IU/L 12.16 (H)   Lab Results  Component Value Date   TSI 236 (H) 03/11/2019   TSI 319 (H) 07/26/2017   Pt denies: - feeling nodules in neck - hoarseness - dysphagia - choking - SOB with lying down  Pt does have a FH of thyroid ds.: 2nd cousin and niece with Graves ds. No FH of thyroid cancer. No h/o radiation tx to head or neck. No recent contrast studies. No herbal supplements. No Biotin use. No recent steroids use.   Pt. also has a history of kidney stones in 2012, 2014, 2016, 2018.  She has a history of lithotripsy.  She does not have active Graves' ophthalmopathy.  She follows with ophthalmology every year. She  saw her eye dr. At [facility] >> small cataract.  She  had TAH several years ago. She developed hot flushes after this.  ROS: + see HPI  I reviewed pt's medications, allergies, PMH, social hx, family hx, and changes were documented in the history of present illness. Otherwise, unchanged from my initial visit note.  Past Medical History:  Diagnosis Date   Asthma    History of umbilical hernia    Renal stones    Past Surgical History:  Procedure Laterality Date   lithotrypsy     UMBILICAL HERNIA REPAIR  1990   Social History   Socioeconomic History   Marital status: Married    Spouse name: Not on file   Number of children: 10: 43 year old in 2019   Years of education: Not on file   Highest education level: Not on file  Occupational History    Transport planner  Social Needs   Financial resource strain: Not on file   Food insecurity:    Worry: Not on file    Inability: Not on file   Transportation needs:    Medical: Not on file    Non-medical: Not on file  Tobacco Use   Smoking status: Current Every Day Smoker    Packs/day: 1.00    Years: 39.00    Pack years: 39.00    Types: Cigarettes   Smokeless tobacco: Never Used   Tobacco comment: wants Nicotine gum. 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 1 Inhaler 0   Ascorbic Acid (VITAMIN C PO) Take 1 tablet by mouth daily. Reported on 06/13/2015     buPROPion (WELLBUTRIN SR) 150 MG 12 hr tablet  Take 1 tablet (150 mg total) by mouth 2 (two) times daily. 60 tablet 5   Diclofenac Sodium 3 % GEL Apply 2 g topically 2 (two) times daily. 100 g 0   estradiol (ESTRACE) 1 MG tablet Take 1 tablet (1 mg total) by mouth daily. (Patient not taking: Reported on 09/12/2020) 90 tablet 4   hydrochlorothiazide (HYDRODIURIL) 25 MG tablet Take 1 tablet by mouth once  daily 90 tablet 3   HYDROcodone-acetaminophen (NORCO) 5-325 MG tablet Take 1 tablet by mouth daily as needed for moderate pain. 30 tablet 0   methimazole (TAPAZOLE) 5 MG tablet Take 1 tablet (5 mg total) by mouth daily. 90 tablet 1   naproxen (NAPROSYN) 500 MG tablet TAKE 1 TABLET BY MOUTH TWICE DAILY WITH A MEAL 28 tablet 0   No current facility-administered medications on file prior to visit.   No Known Allergies Family History  Problem Relation Age of Onset   Cancer Mother    Diabetes Father    Colon cancer Neg Hx    PE: BP 118/82 (BP Location: Right Arm, Patient Position: Sitting, Cuff Size: Normal)    Pulse 70    Ht 5\' 6"  (1.676 m)    Wt 193 lb 6.4 oz (87.7 kg)    LMP 03/14/2015 (Approximate)    SpO2 97%    BMI 31.22 kg/m  Wt Readings from Last 3 Encounters:  03/17/21 193 lb 6.4 oz (87.7 kg)  12/14/20 190 lb 8 oz (86.4 kg)  11/30/20 194 lb 6.4 oz (88.2 kg)   Constitutional: overweight, in NAD Eyes: PERRLA, EOMI, no exophthalmos ENT: moist mucous membranes, + symmetric, nonnodular, thyromegaly, no cervical lymphadenopathy Cardiovascular: RRR, No MRG Respiratory: CTA B Musculoskeletal: no deformities, strength intact in all 4 Skin: moist, warm, no rashes Neurological: no tremor with outstretched hands, DTR normal in all 4  ASSESSMENT: 1. Graves ds.   2.  Graves' ophthalmopathy  PLAN:  1. Patient with  history of an enlarged thyroid discovered by patient when she was looking in the mirror in 03/2017.  Around that time, she was also found to have thyrotoxicosis.  Thyrotropin receptor antibodies were also elevated, pointing  towards Graves' disease.  She was started on methimazole 10 mg once a day and Inderal 10 mg twice a day.  Retrospectively, she also noticed weight loss, heat intolerance, palpitations, tremors, increased appetite, anxiety.  Symptoms resolved after starting the above medications.  However, she was not compliant with her thyroid hormone checks and we could not adjust the methimazole dose in a timely fashion.  In 03/2019, we decreased the dose of methimazole and she again did not present for labs as recommended.  Also, she ran out of methimazole.  I strongly advised her against theses practices.  As of now, she is on 5 mg of methimazole, which we continued at last visit.  She tolerates this well. -No hyperthyroid complaints at this visit.  She has a history of hot flashes, but they developed after her TAH surgery so they are most likely postmenopausal -We will check her TFTs today and change the methimazole dose accordingly -I am hoping that we can decrease the dose of methimazole after the results return and -I will see her back in 1 year, but sooner for labs  2.  Graves' ophthalmopathy -Her exophthalmos is improved and not visible anymore -No blurry vision, double vision, eye pain, chemosis .  She does complain of itchy eyes for few days, possibly due to exposure to fumes at work. -Her TSI antibodies were elevated in the past but they decreased at last check -We will repeat these today -She continues to follow-up with ophthalmology on a yearly basis -she will go today to schedule a new appointment  Component     Latest Ref Rng & Units 03/17/2021  TSH     0.35 - 5.50 uIU/mL 1.33  T4,Free(Direct)     0.60 - 1.60 ng/dL 0.80  Triiodothyronine,Free,Serum     2.3 - 4.2 pg/mL 3.9  TFTs are normal.  TSI's are pending.  We will continue the same dose of methimazole.  Philemon Kingdom, MD PhD [facility] Endocrinology

## 2021-03-21 LAB — THYROID STIMULATING IMMUNOGLOBULIN: TSI: 222 % baseline — ABNORMAL HIGH (ref <140)

## 2021-03-22 ENCOUNTER — Ambulatory Visit: Payer: BC Managed Care – PPO | Admitting: Internal Medicine

## 2021-03-22 ENCOUNTER — Other Ambulatory Visit: Payer: Self-pay

## 2021-03-22 ENCOUNTER — Encounter: Payer: Self-pay | Admitting: Internal Medicine

## 2021-03-22 VITALS — BP 126/87 | HR 80 | Temp 98.3°F | Ht 66.0 in | Wt 194.4 lb

## 2021-03-22 DIAGNOSIS — E669 Obesity, unspecified: Secondary | ICD-10-CM

## 2021-03-22 DIAGNOSIS — I10 Essential (primary) hypertension: Secondary | ICD-10-CM

## 2021-03-22 DIAGNOSIS — F1190 Opioid use, unspecified, uncomplicated: Secondary | ICD-10-CM | POA: Diagnosis not present

## 2021-03-22 DIAGNOSIS — M1711 Unilateral primary osteoarthritis, right knee: Secondary | ICD-10-CM | POA: Diagnosis not present

## 2021-03-22 DIAGNOSIS — R7303 Prediabetes: Secondary | ICD-10-CM

## 2021-03-22 DIAGNOSIS — Z716 Tobacco abuse counseling: Secondary | ICD-10-CM | POA: Diagnosis not present

## 2021-03-22 MED ORDER — NAPROXEN 500 MG PO TABS: 500.0000 mg | ORAL_TABLET | Freq: Two times a day (BID) | ORAL | 1 refills | Status: DC

## 2021-03-22 MED ORDER — HYDROCODONE-ACETAMINOPHEN 5-325 MG PO TABS: 1.0000 | ORAL_TABLET | Freq: Every day | ORAL | 0 refills | Status: DC | PRN

## 2021-03-22 MED ORDER — HYDROCHLOROTHIAZIDE 25 MG PO TABS: 25.0000 mg | ORAL_TABLET | Freq: Every day | ORAL | 3 refills | Status: DC

## 2021-03-22 NOTE — Assessment & Plan Note (Signed)
Patient is here for follow-up of her chronic knee pain.  She has advanced tricompartmental osteoarthritis of her right knee for which she was offered total knee arthroplasty per orthopedic surgery.  She would like to put this off as long as possible.  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  Her pain is currently well controlled with naproxen 500 mg BID and very low-dose Norco 5-325 mg once daily as needed.  She finds naproxen to be most effective at controlling her pain during the day however she develops a throbbing pain in her knee during the evenings after work that limit her ability to do chores around the house and often keeps her awake at night. For this she takes Norco nightly PRN.  She has previously tried intra-articular steroid injection which was ineffective. We discussed the risks and benefits of continuing with opioid therapy.  She was counseled on the risks of addiction, physical dependence, and the possibility of decreased effectiveness over time.  However she is currently requiring a very low dose and has been using appropriately. I feel it is reasonable to continue as a bridge until she decides to proceed with total knee replacement. -- Controlled substance contract completed today  -- Sent 3 Rxs for Norco 5-325 mg QHS PRN, #30 tablets / month -- Follow up Utox from today; last took norco yesterday evening  -- Follow up 3 months

## 2021-03-22 NOTE — Patient Instructions (Signed)
Christy Burke,  It was a pleasure to see you. I have sent refills of your medications to your pharmacy. Please follow up with me again in 3 months or sooner if you have any problems.   If you have any questions or concerns, call our clinic at [phone] or after hours call [phone] and ask for the internal medicine resident on call.   Thank you!  Dr. Darnell Level

## 2021-03-22 NOTE — Progress Notes (Signed)
Copy of Pain Contract given to pt. 

## 2021-03-22 NOTE — Progress Notes (Signed)
Subjective:   Patient ID: Christy Burke female   DOB: 10/22/1960 61 y.o.   MRN: [id number]  HPI: [name] is a 61 y.o. female with past medical history outlined below here for follow up of chronic knee pain. For the details of today's visit, please refer to the assessment and plan.   Past Medical History:  Diagnosis Date   Asthma    History of umbilical hernia    Renal stones    Current Outpatient Medications  Medication Sig Dispense Refill   albuterol (VENTOLIN HFA) 108 (90 Base) MCG/ACT inhaler Inhale 2 puffs into the lungs every 6 (six) hours as needed for wheezing or shortness of breath. 1 Inhaler 0   Ascorbic Acid (VITAMIN C PO) Take 1 tablet by mouth daily. Reported on 06/13/2015     buPROPion (WELLBUTRIN SR) 150 MG 12 hr tablet Take 1 tablet (150 mg total) by mouth 2 (two) times daily. 60 tablet 5   Diclofenac Sodium 3 % GEL Apply 2 g topically 2 (two) times daily. 100 g 0   estradiol (ESTRACE) 1 MG tablet Take 1 tablet (1 mg total) by mouth daily. (Patient not taking: Reported on 09/12/2020) 90 tablet 4   hydrochlorothiazide (HYDRODIURIL) 25 MG tablet Take 1 tablet by mouth once daily 90 tablet 3   HYDROcodone-acetaminophen (NORCO) 5-325 MG tablet Take 1 tablet by mouth daily as needed for moderate pain. 30 tablet 0   methimazole (TAPAZOLE) 5 MG tablet Take 1 tablet (5 mg total) by mouth daily. 90 tablet 1   naproxen (NAPROSYN) 500 MG tablet TAKE 1 TABLET BY MOUTH TWICE DAILY WITH A MEAL 28 tablet 0   No current facility-administered medications for this visit.   Family History  Problem Relation Age of Onset   Cancer Mother    Diabetes Father    Colon cancer Neg Hx    Social History   Socioeconomic History   Marital status: Married    Spouse name: Not on file   Number of children: Not on file   Years of education: Not on file   Highest education level: Not on file  Occupational History   Not on file  Tobacco Use   Smoking status: Every Day    Packs/day:  0.50    Years: 39.00    Pack years: 19.50    Types: Cigarettes   Smokeless tobacco: Never   Tobacco comments:    Sometimes less.  Substance and Sexual Activity   Alcohol use: No    Alcohol/week: 0.0 standard drinks    Comment: Quit 9 yrs ago.   Drug use: No    Types: Cocaine    Comment: Quit several years ago, never did IVDU only smoked cocaine   Sexual activity: Yes    Birth control/protection: Other-see comments    Comment: In same sex relationship for 19 years  Other Topics Concern   Not on file  Social History Narrative   Not on file   Social Determinants of Health   Financial Resource Strain: Not on file  Food Insecurity: Not on file  Transportation Needs: Not on file  Physical Activity: Not on file  Stress: Not on file  Social Connections: Not on file    Review of Systems: Review of Systems  Musculoskeletal:  Positive for joint pain. Negative for falls.    Objective:  Physical Exam:  Vitals:   03/22/21 0851  BP: 126/87  Pulse: 80  Temp: 98.3 F (36.8 C)  TempSrc: Oral  SpO2: 100%  Weight: 194 lb 6.4 oz (88.2 kg)  Height: 5\' 6"  (1.676 m)    Physical Exam Constitutional:      Appearance: Normal appearance.  Cardiovascular:     Rate and Rhythm: Normal rate and regular rhythm.  Pulmonary:     Effort: Pulmonary effort is normal.     Breath sounds: Normal breath sounds.  Musculoskeletal:     Right lower leg: No edema.     Left lower leg: No edema.  Skin:    General: Skin is warm and dry.  Neurological:     General: No focal deficit present.     Mental Status: She is alert and oriented to person, place, and time.  Psychiatric:        Mood and Affect: Mood normal.        Behavior: Behavior normal.      Assessment & Plan:   Osteoarthritis of right knee Patient is here for follow-up of her chronic knee pain.  She has advanced tricompartmental osteoarthritis of her right knee for which she was offered total knee arthroplasty per orthopedic  surgery.  She would like to put this off as long as possible.  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  Her pain is currently well controlled with naproxen 500 mg BID and very low-dose Norco 5-325 mg once daily as needed.  She finds naproxen to be most effective at controlling her pain during the day however she develops a throbbing pain in her knee during the evenings after work that limit her ability to do chores around the house and often keeps her awake at night. For this she takes Norco nightly PRN.  She has previously tried intra-articular steroid injection which was ineffective. We discussed the risks and benefits of continuing with opioid therapy.  She was counseled on the risks of addiction, physical dependence, and the possibility of decreased effectiveness over time.  However she is currently requiring a very low dose and has been using appropriately. I feel it is reasonable to continue as a bridge until she decides to proceed with total knee replacement. -- Controlled substance contract completed today  -- Sent 3 Rxs for Norco 5-325 mg QHS PRN, #30 tablets / month -- Follow up Utox from today; last took norco yesterday evening  -- Follow up 3 months   Encounter for smoking cessation counseling Has been off wellbutrin, still smoking. We discussed restarting this, she has her old prescription at home.   HTN (hypertension) Chronic and well controlled on HCTZ 25 mg daily. BMP today with normal renal function.   Obesity (BMI 30.0-34.9) Patient is motivated to lose weight. We discussed lifestyle changes including diet / exercise +/- medication therapy like a GLP-1 agonist. She strongly dislikes needles and is not interested in an injectable. She would be willing to try an oral medication like rybelsus, although I explained this would be considered off label for weight loss. She is prediabetic. She prefers to try diet and exercise first. Will  readdress at her next visit.

## 2021-03-23 ENCOUNTER — Encounter: Payer: Self-pay | Admitting: Internal Medicine

## 2021-03-23 LAB — BMP8+ANION GAP
Anion Gap: 14 mmol/L (ref 10.0–18.0)
BUN/Creatinine Ratio: 16 (ref 9–23)
BUN: 13 mg/dL (ref 6–24)
CO2: 25 mmol/L (ref 20–29)
Calcium: 10 mg/dL (ref 8.7–10.2)
Chloride: 101 mmol/L (ref 96–106)
Creatinine, Ser: 0.82 mg/dL (ref 0.57–1.00)
Glucose: 84 mg/dL (ref 70–99)
Potassium: 3.9 mmol/L (ref 3.5–5.2)
Sodium: 140 mmol/L (ref 134–144)
eGFR: 83 mL/min/{1.73_m2} (ref >59)

## 2021-03-23 NOTE — Assessment & Plan Note (Signed)
Patient is motivated to lose weight. We discussed lifestyle changes including diet / exercise +/- medication therapy like a GLP-1 agonist. She strongly dislikes needles and is not interested in an injectable. She would be willing to try an oral medication like rybelsus, although I explained this would be considered off label for weight loss. She is prediabetic. She prefers to try diet and exercise first. Will readdress at her next visit.

## 2021-03-23 NOTE — Assessment & Plan Note (Signed)
Has been off wellbutrin, still smoking. We discussed restarting this, she has her old prescription at home.

## 2021-03-23 NOTE — Assessment & Plan Note (Signed)
Chronic and well controlled on HCTZ 25 mg daily. BMP today with normal renal function.

## 2021-03-28 LAB — TOXASSURE SELECT,+ANTIDEPR,UR

## 2021-04-17 ENCOUNTER — Other Ambulatory Visit: Payer: Self-pay

## 2021-04-17 DIAGNOSIS — M1711 Unilateral primary osteoarthritis, right knee: Secondary | ICD-10-CM

## 2021-04-17 MED ORDER — HYDROCODONE-ACETAMINOPHEN 5-325 MG PO TABS: 1.0000 | ORAL_TABLET | Freq: Every day | ORAL | 0 refills | Status: DC | PRN

## 2021-04-17 NOTE — Telephone Encounter (Signed)
HYDROcodone-acetaminophen (NORCO) 5-325 MG tablet, refill request @ [id number] -  ([facility]), [place] - [address]. 

## 2021-04-17 NOTE — Telephone Encounter (Signed)
Last rx written  03/22/21. Last OV  03/22/21. Next OV 07/05/21. UDS  03/22/21.

## 2021-04-28 ENCOUNTER — Other Ambulatory Visit: Payer: Self-pay | Admitting: Internal Medicine

## 2021-04-28 DIAGNOSIS — Z1231 Encounter for screening mammogram for malignant neoplasm of breast: Secondary | ICD-10-CM

## 2021-05-18 ENCOUNTER — Other Ambulatory Visit: Payer: Self-pay

## 2021-05-18 MED ORDER — HYDROCODONE-ACETAMINOPHEN 5-325 MG PO TABS: 1.0000 | ORAL_TABLET | Freq: Every day | ORAL | 0 refills | Status: DC | PRN

## 2021-05-18 NOTE — Telephone Encounter (Signed)
HYDROcodone-acetaminophen (NORCO) 5-325 MG tablet, REFILL REQUEST @ [facility] ([facility]), [place] - [place]. ?

## 2021-05-18 NOTE — Telephone Encounter (Signed)
Norco refill approved. Please let her know I sent two prescriptions to last her until her appointment with me in May. Please let her know to call the pharmacy next month to fill the second prescription when due.  ?

## 2021-05-18 NOTE — Telephone Encounter (Signed)
Last rx written  04/17/21. ?Last OV  03/22/21. ?Next OV  07/05/21. ?UDS  03/22/21. ? ?

## 2021-05-31 ENCOUNTER — Ambulatory Visit: Payer: BC Managed Care – PPO

## 2021-06-05 ENCOUNTER — Ambulatory Visit
Admission: RE | Admit: 2021-06-05 | Discharge: 2021-06-05 | Disposition: A | Discharge status: Home / Self Care | Payer: BC Managed Care – PPO | Source: Ambulatory Visit | Attending: Internal Medicine | Admitting: Internal Medicine

## 2021-06-05 DIAGNOSIS — Z1231 Encounter for screening mammogram for malignant neoplasm of breast: Secondary | ICD-10-CM

## 2021-06-27 ENCOUNTER — Other Ambulatory Visit: Payer: Self-pay | Admitting: Internal Medicine

## 2021-06-27 DIAGNOSIS — M1711 Unilateral primary osteoarthritis, right knee: Secondary | ICD-10-CM

## 2021-06-27 MED ORDER — HYDROCODONE-ACETAMINOPHEN 5-325 MG PO TABS: 1.0000 | ORAL_TABLET | Freq: Every day | ORAL | 0 refills | Status: DC | PRN

## 2021-06-27 NOTE — Telephone Encounter (Signed)
Refill Request ? ?HYDROcodone-acetaminophen (NORCO) 5-325 MG tablet ? ?[facility] ([address] [place] (Ph: [phone]) ?

## 2021-07-03 ENCOUNTER — Ambulatory Visit (INDEPENDENT_AMBULATORY_CARE_PROVIDER_SITE_OTHER): Payer: BC Managed Care – PPO | Admitting: Internal Medicine

## 2021-07-03 VITALS — BP 135/80 | HR 79 | Temp 98.2°F | Wt 199.8 lb

## 2021-07-03 DIAGNOSIS — H9203 Otalgia, bilateral: Secondary | ICD-10-CM

## 2021-07-03 NOTE — Progress Notes (Signed)
? ?Acute Office Visit ? ?Subjective:  ? ?  ?Patient ID: Christy Burke, female    DOB: May 18, 1960, 61 y.o.   MRN: [id number] ? ?Chief Complaint  ?Patient presents with  ? Otalgia  ?  Bilateral ?  ? ? ?HPI ?Patient presents today complaining of ear pain that began 1 day ago.  The pain is bilateral and feels like a "closing" sensation.  The pain radiates behind the ear and around the pinna.  She also noted some mild jaw pain on the left side when eating.  She denies any pus or drainage from the ears bilaterally.  Last night she peroxide and spring water into both ears with slight improvement of her symptoms.  She uses Q-tips daily.  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  She works about 3-4 times a week wearing the earplugs.  She has some tenderness at the end of the workday but this ear pain is worse than her baseline.  Which prompted office visit.  She denies fever, chills, headache, congestion, cough, or sore throat.  ? ?She was recently treated for conjunctivitis with ciprofloxacin 1 month ago, which has since resolved. ? ? ? ?Review of Systems  ?Constitutional:  Negative for chills and fever.  ?HENT:  Positive for ear pain. Negative for congestion, ear discharge, hearing loss, nosebleeds, sinus pain, sore throat and tinnitus.   ?Eyes:  Negative for blurred vision, pain, discharge and redness.  ?Respiratory:  Negative for cough and shortness of breath.   ?Cardiovascular:  Negative for chest pain and palpitations.  ?Gastrointestinal:  Negative for constipation, diarrhea, nausea and vomiting.  ?Neurological:  Negative for dizziness and headaches.  ? ? ?   ?Objective:  ?  ?BP 135/80 (BP Location: Left Arm, Patient Position: Sitting, Cuff Size: Normal)   Pulse 79   Temp 98.2 ?F (36.8 ?C) (Oral)   Wt 199 lb 12.8 oz (90.6 kg)   LMP 03/14/2015 (Approximate)   SpO2 100%   BMI 32.25 kg/m?  ? ? ?Physical Exam ?HENT:  ?   Head: Normocephalic and atraumatic.  ?   Jaw:  No tenderness.  ?   Right Ear: Hearing, tympanic membrane, ear canal and external ear normal. No drainage or swelling. There is no impacted cerumen. No mastoid tenderness. Tympanic membrane is not perforated, erythematous, retracted or bulging.  ?   Left Ear: Hearing, tympanic membrane, ear canal and external ear normal. No drainage or swelling. There is no impacted cerumen. No mastoid tenderness. Tympanic membrane is not perforated, erythematous, retracted or bulging.  ?   Ears:  ?   Comments: Mild tenderness of the pinna with palpation ?Eyes:  ?   General: Lids are normal.  ?   Conjunctiva/sclera:  ?   Right eye: Right conjunctiva is not injected.  ?   Left eye: Left conjunctiva is not injected.  ?Cardiovascular:  ?   Rate and Rhythm: Normal rate and regular rhythm.  ?   Heart sounds: Normal heart sounds.  ?Pulmonary:  ?   Effort: Pulmonary effort is normal.  ?   Breath sounds: Normal breath sounds.  ?Abdominal:  ?   Tenderness: There is no abdominal tenderness.  ?Musculoskeletal:  ?   Cervical back: Full passive range of motion without pain.  ?Skin: ?   General: Skin is warm and dry.  ?Neurological:  ?   General: No focal deficit present.  ?   Mental Status: She is alert.  ?Psychiatric:     ?  Attention and Perception: Attention normal.     ?   Mood and Affect: Mood normal.     ?   Speech: Speech normal.     ?   Behavior: Behavior is cooperative.  ? ? ? ? ?   ?Assessment & Plan:  ? ?Bilateral Otalgia  ?External ear pain in the absence of systemic signs of infection.  On physical exam, mild tenderness of the pinna with palpation.  Normal-appearing tympanic membrane.  No bulging or retraction; no erythema or opaque color of the tympanic membrane noted.  Given daily the use of Q-tip, occupational use of earplugs and pouring peroxide/spring water into the ears likely resulted in ear ache.  It is unlikely to experience bilateral otitis media.  Low suspicion for ear infection.  Although she had some mild tenderness of  the pinna, no pus, drainage, pruritus or hearing loss noted of the ears, unlikely to be acute otitis externa. ? ?Plan: ?-Avoid use of Q-tips ?-Provided patient with over-the-counter alternatives for ear cleaning ?-Avoid pouring peroxide or spring water into the ears ?-Advised to call the office if symptoms worsen ?-Provided patient with supplement education regarding earache ? ? ? ? ?Timothy Lasso, MD ? ? ?

## 2021-07-03 NOTE — Patient Instructions (Addendum)
Thank you, Ms.Schwanda Waitman for allowing [place] to provide your care today. Today we discussed your ear pain.   ? ?I have ordered the following labs for you: ? ?None ? ?Tests ordered today: ? ?None ? ?Referrals ordered today:  ? ?None ? ?Here are some over the counter alternates for ear cleaning: ? ?Clearcanal Earwax Softener ?Clinere Earwax Removal Kit ?Ear Drops Earwax Aid ?Ear Drops ?Gly-Oxide ?GoodSense Ear Wax Kit ?GoodSense Ear Wax Removal ? ?Remember: If your ear pain worsens or you notice pus/drainage from the ear please call the office. ? ?Should you have any questions or concerns please call the internal medicine clinic at [phone].   ? ?Timothy Lasso, MD ?[place] ?  ?

## 2021-07-04 ENCOUNTER — Encounter: Payer: BC Managed Care – PPO | Admitting: Internal Medicine

## 2021-07-04 NOTE — Progress Notes (Signed)
Internal Medicine Clinic Attending ? ?Case discussed with Dr. Ariwodo  At the time of the visit.  We reviewed the resident?s history and exam and pertinent patient test results.  I agree with the assessment, diagnosis, and plan of care documented in the resident?s note.  ?

## 2021-07-05 ENCOUNTER — Encounter: Payer: BC Managed Care – PPO | Admitting: Internal Medicine

## 2021-07-27 ENCOUNTER — Other Ambulatory Visit: Payer: Self-pay | Admitting: *Deleted

## 2021-07-27 DIAGNOSIS — M1711 Unilateral primary osteoarthritis, right knee: Secondary | ICD-10-CM

## 2021-07-27 NOTE — Telephone Encounter (Signed)
Last ToxAssure 03/22/2021. No upcoming appointments

## 2021-07-28 MED ORDER — HYDROCODONE-ACETAMINOPHEN 5-325 MG PO TABS: 1.0000 | ORAL_TABLET | Freq: Every day | ORAL | 0 refills | Status: DC | PRN

## 2021-07-28 NOTE — Telephone Encounter (Signed)
Norco approved. Please have patient schedule follow up with me. Thanks!

## 2021-08-01 ENCOUNTER — Other Ambulatory Visit: Payer: Self-pay | Admitting: Internal Medicine

## 2021-08-01 DIAGNOSIS — E0500 Thyrotoxicosis with diffuse goiter without thyrotoxic crisis or storm: Secondary | ICD-10-CM

## 2021-08-09 ENCOUNTER — Encounter: Payer: BC Managed Care – PPO | Admitting: Internal Medicine

## 2021-08-22 ENCOUNTER — Other Ambulatory Visit: Payer: Self-pay

## 2021-08-22 DIAGNOSIS — M1711 Unilateral primary osteoarthritis, right knee: Secondary | ICD-10-CM

## 2021-08-22 MED ORDER — HYDROCODONE-ACETAMINOPHEN 5-325 MG PO TABS: 1.0000 | ORAL_TABLET | Freq: Every day | ORAL | 0 refills | Status: DC | PRN

## 2021-08-22 NOTE — Telephone Encounter (Signed)
HYDROcodone-acetaminophen (NORCO) 5-325 MG tablet, REFILL REQUEST @ [facility] ([facility]), [place] - [place].

## 2021-08-22 NOTE — Telephone Encounter (Signed)
Last ToxAssure 03/22/2021.  Last Appointment 08/09/2021.  Next appointment 10/04/2021

## 2021-09-12 ENCOUNTER — Encounter: Payer: Self-pay | Admitting: Emergency Medicine

## 2021-09-12 ENCOUNTER — Other Ambulatory Visit: Payer: Self-pay

## 2021-09-12 ENCOUNTER — Ambulatory Visit
Admission: EM | Admit: 2021-09-12 | Discharge: 2021-09-12 | Disposition: A | Discharge status: Home / Self Care | Payer: BC Managed Care – PPO | Attending: Internal Medicine | Admitting: Internal Medicine

## 2021-09-12 DIAGNOSIS — H60391 Other infective otitis externa, right ear: Secondary | ICD-10-CM

## 2021-09-12 MED ORDER — CLINDAMYCIN HCL 150 MG PO CAPS: 300.0000 mg | ORAL_CAPSULE | Freq: Three times a day (TID) | ORAL | 0 refills | Status: AC

## 2021-09-12 MED ORDER — MUPIROCIN 2 % EX OINT: 1.0000 | TOPICAL_OINTMENT | Freq: Two times a day (BID) | CUTANEOUS | 0 refills | Status: DC

## 2021-09-12 NOTE — ED Triage Notes (Signed)
Pt sts bilateral ear pain on the outside of ear with possible skin infection to right ear x 4 days

## 2021-09-12 NOTE — Discharge Instructions (Signed)
You have an infection of your outer ear which is being treated with oral antibiotics as well as an antibiotic cream that you apply directly to affected area.  Please follow-up with ear, nose, throat doctor at provided contact information for further evaluation and management.

## 2021-09-12 NOTE — ED Provider Notes (Signed)
[facility]    CSN: [id number] Arrival date & time: 09/12/21  1022      History   Chief Complaint Chief Complaint  Patient presents with   Otalgia    HPI Christy Burke is a 61 y.o. female.   Patient presents with pain to outer portion of the ear as well as concern for infection that started about 4 days ago.  Patient reports that they have noticed that the tragus of the ear has been swollen and painful.  Denies trauma or foreign body.  They were seen at PCP about 2 months ago for pain to that area but PCP did not find any abnormalities.  Those symptoms resolved, and symptoms returned about 4 days ago.  Denies fever, body aches, chills.  Denies pain in the inner part of the ear.   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      Home Medications    Prior to Admission medications   Medication Sig Start Date End Date Taking? Authorizing Provider  clindamycin (CLEOCIN) 150 MG capsule  Take 2 capsules (300 mg total) by mouth 3 (three) times daily for 7 days. 09/12/21 09/19/21 Yes Cerita Rabelo, Michele Rockers, FNP  mupirocin ointment (BACTROBAN) 2 % Apply 1 Application topically 2 (two) times daily. Apply to affected area 09/12/21  Yes Starla Deller, Michele Rockers, FNP  albuterol (VENTOLIN HFA) 108 (90 Base) MCG/ACT inhaler Inhale 2 puffs into the lungs every 6 (six) hours as needed for wheezing or shortness of breath. 07/02/18   Tasia Catchings, Amy V, PA-C  Ascorbic Acid (VITAMIN C PO) Take 1 tablet by mouth daily. Reported on 06/13/2015    [provider]  buPROPion (WELLBUTRIN SR) 150 MG 12 hr tablet Take 1 tablet (150 mg total) by mouth 2 (two) times daily. 11/30/20 11/30/21  Velna Ochs, MD  Diclofenac Sodium 3 % GEL Apply 2 g topically 2 (two) times daily. 10/03/20   Jaynee Eagles, PA-C  estradiol (ESTRACE) 1 MG tablet Take 1 tablet (1 mg total) by mouth daily. Patient not taking: Reported on 09/12/2020 12/03/17   Marchia Meiers, MD  hydrochlorothiazide (HYDRODIURIL) 25 MG tablet Take 1 tablet (25 mg total) by mouth daily. 03/22/21   Velna Ochs, MD  HYDROcodone-acetaminophen (NORCO) 5-325 MG tablet Take 1 tablet by mouth daily as needed for moderate pain. 08/22/21   Velna Ochs, MD  methimazole (TAPAZOLE) 5 MG tablet TAKE 1 TABLET BY MOUTH IN THE MORNING  AND 1/2 IN THE EVENING 08/01/21   Philemon Kingdom, MD  naproxen (NAPROSYN) 500 MG tablet Take 1 tablet (500 mg total) by mouth 2 (two) times daily with a meal. 03/22/21   Velna Ochs, MD    Family History Family History  Problem Relation Age of Onset   Breast cancer Mother    Cancer Mother    Diabetes Father    Colon cancer Neg Hx     Social History Social History   Tobacco Use   Smoking status: Every Day    Packs/day: 0.50    Years: 39.00    Total pack years: 19.50    Types: Cigarettes   Smokeless tobacco: Never   Tobacco comments:    Sometimes less.  Substance Use Topics   Alcohol use: No    Alcohol/week: 0.0 standard drinks  of alcohol    Comment: Quit 9 yrs ago.   Drug use: No    Types: Cocaine    Comment: Quit several years ago, never did IVDU only smoked cocaine     Allergies   Patient has no known allergies.   Review of Systems Review of Systems Per HPI  Physical Exam Triage Vital Signs ED Triage Vitals [09/12/21 1046]  Enc Vitals Group     BP 133/90     Pulse Rate 72     Resp 18     Temp 98.1 F (36.7 C)     Temp Source Oral     SpO2 97 %     Weight      Height      Head Circumference      Peak Flow      Pain Score 5     Pain Loc      Pain Edu?      Excl. in [place]?    No data found.  Updated Vital Signs BP 133/90 (BP Location: Left Arm)   Pulse 72   Temp 98.1 F (36.7 C) (Oral)   Resp 18   LMP 03/14/2015 (Approximate)   SpO2 97%   Visual Acuity Right Eye Distance:   Left Eye Distance:   Bilateral Distance:    Right Eye Near:   Left Eye Near:    Bilateral Near:     Physical Exam Constitutional:      General: She is not in acute distress.    Appearance: Normal appearance. She is not toxic-appearing.  HENT:     Head: Normocephalic and atraumatic.     Right Ear: Tympanic membrane and ear canal normal. Swelling present.     Left Ear: Tympanic membrane, ear canal and external ear normal.     Ears:      Comments: Right tragus is mildly swollen, erythematous, crusty. Eyes:     Extraocular Movements: Extraocular movements intact.     Conjunctiva/sclera: Conjunctivae normal.  Pulmonary:     Effort: Pulmonary effort is normal.  Neurological:     General: No focal deficit present.     Mental Status: She is alert and oriented to person, place, and time. Mental status is at baseline.  Psychiatric:        Mood and Affect: Mood normal.        Behavior: Behavior normal.        Thought Content: Thought content normal.        Judgment: Judgment normal.      UC Treatments / Results  Labs (all labs ordered are listed, but only abnormal results are displayed) Labs  Reviewed -  No data to display  EKG   Radiology No results found.  Procedures Procedures (including critical care time)  Medications Ordered in UC Medications - No data to display  Initial Impression / Assessment and Plan / UC Course  I have reviewed the triage vital signs and the nursing notes.  Pertinent labs & imaging results that were available during my care of the patient were reviewed by me and considered in my medical decision making (see chart for details).     Tragus of right ear appears to be infected.  Dr. Marcelline Deist with on-call ENT was called to consult.  He states that it could be related to preauricular pit and he recommended clindamycin for 1 week and mupirocin ointment.  Advised patient to follow-up with ENT specialty at provided contact information for further evaluation and management as well.  Discussed return precautions.  Patient verbalized understanding and was agreeable with plan. 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  Please follow-up with ear, nose, throat doctor at provided contact information for further evaluation and management.    ED Prescriptions     Medication Sig Dispense Auth. Provider   clindamycin (CLEOCIN) 150 MG capsule Take 2 capsules (300 mg total) by mouth 3 (three) times daily for 7 days. 42 capsule [place], Justin E, [place]   mupirocin ointment (BACTROBAN) 2 % Apply 1 Application topically 2 (two) times daily. Apply to affected area 22 g Teodora Medici, [place]      PDMP not reviewed this encounter.   Teodora Medici, Effie 09/12/21 1124

## 2021-09-21 ENCOUNTER — Other Ambulatory Visit: Payer: Self-pay

## 2021-09-21 DIAGNOSIS — M1711 Unilateral primary osteoarthritis, right knee: Secondary | ICD-10-CM

## 2021-09-21 MED ORDER — HYDROCODONE-ACETAMINOPHEN 5-325 MG PO TABS: 1.0000 | ORAL_TABLET | Freq: Every day | ORAL | 0 refills | Status: DC | PRN

## 2021-09-21 NOTE — Telephone Encounter (Signed)
Last rx written 08/22/21. Last OV 1/18 with PCP; 5/1 with Dr Jeanice Lim. Next OV 10/04/21. UDS 03/22/21.

## 2021-09-21 NOTE — Telephone Encounter (Signed)
HYDROcodone-acetaminophen (NORCO) 5-325 MG tablet, refill request @ [facility] ([facility]), [place] - [place].

## 2021-09-21 NOTE — Telephone Encounter (Signed)
Last appointment 08/09/2021.  Next appointment 10/04/2021.  Last ToxAssure 03/22/2021

## 2021-10-04 ENCOUNTER — Encounter: Payer: Self-pay | Admitting: Internal Medicine

## 2021-10-04 ENCOUNTER — Other Ambulatory Visit: Payer: Self-pay

## 2021-10-04 ENCOUNTER — Ambulatory Visit (INDEPENDENT_AMBULATORY_CARE_PROVIDER_SITE_OTHER): Payer: BC Managed Care – PPO | Admitting: Internal Medicine

## 2021-10-04 VITALS — BP 122/85 | HR 65 | Temp 97.8°F | Ht 66.0 in | Wt 192.8 lb

## 2021-10-04 DIAGNOSIS — E0500 Thyrotoxicosis with diffuse goiter without thyrotoxic crisis or storm: Secondary | ICD-10-CM

## 2021-10-04 DIAGNOSIS — I10 Essential (primary) hypertension: Secondary | ICD-10-CM

## 2021-10-04 DIAGNOSIS — F1190 Opioid use, unspecified, uncomplicated: Secondary | ICD-10-CM

## 2021-10-04 DIAGNOSIS — F17210 Nicotine dependence, cigarettes, uncomplicated: Secondary | ICD-10-CM

## 2021-10-04 DIAGNOSIS — M1711 Unilateral primary osteoarthritis, right knee: Secondary | ICD-10-CM

## 2021-10-04 DIAGNOSIS — Z716 Tobacco abuse counseling: Secondary | ICD-10-CM

## 2021-10-04 MED ORDER — HYDROCODONE-ACETAMINOPHEN 5-325 MG PO TABS: 1.0000 | ORAL_TABLET | Freq: Three times a day (TID) | ORAL | 0 refills | Status: DC | PRN

## 2021-10-04 MED ORDER — BUPROPION HCL ER (SR) 150 MG PO TB12: 150.0000 mg | ORAL_TABLET | Freq: Two times a day (BID) | ORAL | 5 refills | Status: DC

## 2021-10-04 NOTE — Patient Instructions (Signed)
Ms. Woolbright,  It was a pleasure to see you. Please follow up with me again in 1 month to see how you are doing on the increased dose of pain medication. If you have any issues, please sent me a mychart message or give our clinic a call at [phone].   Thank you!  Dr. Darnell Level

## 2021-10-05 LAB — BMP8+ANION GAP
Anion Gap: 16 mmol/L (ref 10.0–18.0)
BUN/Creatinine Ratio: 18 (ref 9–23)
BUN: 14 mg/dL (ref 6–24)
CO2: 24 mmol/L (ref 20–29)
Calcium: 10 mg/dL (ref 8.7–10.2)
Chloride: 104 mmol/L (ref 96–106)
Creatinine, Ser: 0.79 mg/dL (ref 0.57–1.00)
Glucose: 66 mg/dL — ABNORMAL LOW (ref 70–99)
Potassium: 3.7 mmol/L (ref 3.5–5.2)
Sodium: 144 mmol/L (ref 134–144)
eGFR: 87 mL/min/{1.73_m2} (ref >59)

## 2021-10-05 NOTE — Assessment & Plan Note (Signed)
Well controlled on once daily HCTZ 25. BMP checked today WNL. Continue.

## 2021-10-05 NOTE — Assessment & Plan Note (Signed)
Continues to smoke but has cut down significantly and recently switched over to vaping. She is working to taper herself off of the electronic cigarette. Not currently taking wellbutrin but wants to consider restarted. Sent new Rx. Continue to encourage cessation.

## 2021-10-05 NOTE — Progress Notes (Signed)
Subjective:   Patient ID: Deliliah Spranger female   DOB: April 12, 1960 61 y.o.   MRN: [id number]  HPI: Ms.Helene Aho is a 61 y.o. female with past medical history outlined below here for follow up of right knee OA. For the details of today's visit, please refer to the assessment and plan.   Past Medical History:  Diagnosis Date   Asthma    History of umbilical hernia    Renal stones    Current Outpatient Medications  Medication Sig Dispense Refill   albuterol (VENTOLIN HFA) 108 (90 Base) MCG/ACT inhaler Inhale 2 puffs into the lungs every 6 (six) hours as needed for wheezing or shortness of breath. 1 Inhaler 0   Ascorbic Acid (VITAMIN C PO) Take 1 tablet by mouth daily. Reported on 06/13/2015     buPROPion (WELLBUTRIN SR) 150 MG 12 hr tablet Take 1 tablet (150 mg total) by mouth 2 (two) times daily. 60 tablet 5   Diclofenac Sodium 3 % GEL Apply 2 g topically 2 (two) times daily. 100 g 0   estradiol (ESTRACE) 1 MG tablet Take 1 tablet (1 mg total) by mouth daily. (Patient not taking: Reported on 09/12/2020) 90 tablet 4   hydrochlorothiazide (HYDRODIURIL) 25 MG tablet Take 1 tablet (25 mg total) by mouth daily. 90 tablet 3   HYDROcodone-acetaminophen (NORCO) 5-325 MG tablet Take 1 tablet by mouth every 8 (eight) hours as needed for moderate pain. 90 tablet 0   methimazole (TAPAZOLE) 5 MG tablet TAKE 1 TABLET BY MOUTH IN THE MORNING AND 1/2 IN THE EVENING 135 tablet 0   mupirocin ointment (BACTROBAN) 2 % Apply 1 Application topically 2 (two) times daily. Apply to affected area 22 g 0   naproxen (NAPROSYN) 500 MG tablet Take 1 tablet (500 mg total) by mouth 2 (two) times daily with a meal. 180 tablet 1   No current facility-administered medications for this visit.   Family History  Problem Relation Age of Onset   Breast cancer Mother    Cancer Mother    Diabetes Father    Colon cancer Neg Hx    Social History   Socioeconomic History   Marital status: Married    Spouse name: Not on file    Number of children: Not on file   Years of education: Not on file   Highest education level: Not on file  Occupational History   Not on file  Tobacco Use   Smoking status: Former    Packs/day: 0.50    Years: 39.00    Total pack years: 19.50    Types: Cigarettes   Smokeless tobacco: Never   Tobacco comments:    Quit x 1 month. But she is vaporing.  Substance and Sexual Activity   Alcohol use: No    Alcohol/week: 0.0 standard drinks of alcohol    Comment: Quit 9 yrs ago.   Drug use: No    Types: Cocaine    Comment: Quit several years ago, never did IVDU only smoked cocaine   Sexual activity: Yes    Birth control/protection: Other-see comments    Comment: In same sex relationship for 19 years  Other Topics Concern   Not on file  Social History Narrative   Not on file   Social Determinants of Health   Financial Resource Strain: Not on file  Food Insecurity: Not on file  Transportation Needs: Not on file  Physical Activity: Not on file  Stress: Not on file  Social Connections: Not on file  Objective:  Physical Exam:  Vitals:   10/04/21 1015 10/04/21 1021  BP:  122/85  Pulse:  65  Temp:  97.8 F (36.6 C)  TempSrc:  Oral  SpO2:  99%  Weight: 192 lb 12.8 oz (87.5 kg) 192 lb 12.8 oz (87.5 kg)  Height: '5\' 6"'$  (1.676 m) '5\' 6"'$  (1.676 m)    Constitutional: NAD, well appearing Cardiovascular: RRR, no m/r/g Pulmonary/Chest: clear bilaterally, no wheezing  Extremities: Warm, no edema. R knee brace.   Assessment & Plan:   Encounter for smoking cessation counseling Continues to smoke but has cut down significantly and recently switched over to vaping. She is working to taper herself off of the electronic cigarette. Not currently taking wellbutrin but wants to consider restarted. Sent new Rx. Continue to encourage cessation.    Osteoarthritis of right knee Patient is here for follow up of her chronic knee pain. She has advanced tricompartmental OA and was offered TKA  per ortho. 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 She is planning to retire in 2 years and ideally would like to wait until then to have surgery. We have been managing her with low dose once nightly hydrocodone PRN. Symptoms are uncontrolled and interfering with her ability to do her job. She has occasionally taken a norco during the day which helped her complete her job duties. She is not operating heavy  machinery and this was cleared by her supervisor. She feels she would benefit from more frequent dosing and an increase in her night time dose. We discussed risks and benefits on going opioid therapy and dose escalation. I explained the risk of physical dependence and withdrawal and the possibility of needing increased dosing overtime. Since she is on such a low dose, I do feel it is reasonable to increase. We agreed on 2 tablets QHS and 1 tablet PRN during the day. New Rx written for Noroc 5-325 q8h PRN #90 / month. Follow up 4 weeks, will plan for new controlled substance contract.   HTN (hypertension) Well controlled on once daily HCTZ 25. BMP checked today WNL. Continue.   Graves disease Managed by endocrinology, prescribed methimazole 5 mg qAM and 2.5 mg qPM. Last seen in January, TFT at that time were WNL. F/u with endocrine.

## 2021-10-05 NOTE — Assessment & Plan Note (Signed)
Managed by endocrinology, prescribed methimazole 5 mg qAM and 2.5 mg qPM. Last seen in January, TFT at that time were WNL. F/u with endocrine.

## 2021-10-05 NOTE — Assessment & Plan Note (Signed)
Patient is here for follow up of her chronic knee pain. She has advanced tricompartmental OA and was offered TKA per ortho. 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 She is planning to retire in 2 years and ideally would like to wait until then to have surgery. We have been managing her with low dose once nightly hydrocodone PRN. Symptoms are uncontrolled and interfering with her ability to do her job. She has occasionally taken a norco during the day which helped her complete her job duties. She is not operating heavy  machinery and this was cleared by her supervisor. She feels she would benefit from more frequent dosing and an increase in her night time dose. We discussed risks and benefits on going opioid therapy and dose escalation. I explained the risk of physical dependence and withdrawal and the possibility of needing increased dosing overtime. Since she is on such a low dose, I do feel it is reasonable to increase. We agreed on 2 tablets QHS and 1 tablet PRN during the day. New Rx written for Noroc 5-325 q8h PRN #90 / month. Follow up 4 weeks, will plan for new controlled substance contract.

## 2021-10-13 ENCOUNTER — Other Ambulatory Visit: Payer: Self-pay | Admitting: Internal Medicine

## 2021-10-13 DIAGNOSIS — M1711 Unilateral primary osteoarthritis, right knee: Secondary | ICD-10-CM

## 2021-10-13 NOTE — Telephone Encounter (Signed)
Hydrocodone Rx was refilled 10/04/21. I talked to the pt who stated the pharmacy told her it needs a PA.

## 2021-10-13 NOTE — Telephone Encounter (Signed)
Refill Request  HYDROcodone-acetaminophen (NORCO) 5-325 MG tablet  [id number] - [place] ([place]), [place] - [place]

## 2021-10-19 NOTE — Telephone Encounter (Signed)
Talked to pt who stated she cannot pick up he medication until 8/19.

## 2021-10-27 ENCOUNTER — Other Ambulatory Visit: Payer: Self-pay | Admitting: Internal Medicine

## 2021-10-27 DIAGNOSIS — E0500 Thyrotoxicosis with diffuse goiter without thyrotoxic crisis or storm: Secondary | ICD-10-CM

## 2021-11-01 ENCOUNTER — Other Ambulatory Visit: Payer: Self-pay

## 2021-11-01 ENCOUNTER — Encounter: Payer: Self-pay | Admitting: Internal Medicine

## 2021-11-01 ENCOUNTER — Ambulatory Visit (INDEPENDENT_AMBULATORY_CARE_PROVIDER_SITE_OTHER): Payer: BC Managed Care – PPO | Admitting: Internal Medicine

## 2021-11-01 VITALS — BP 125/97 | HR 70 | Temp 98.1°F | Ht 66.0 in | Wt 194.0 lb

## 2021-11-01 DIAGNOSIS — R7303 Prediabetes: Secondary | ICD-10-CM | POA: Diagnosis not present

## 2021-11-01 DIAGNOSIS — F1190 Opioid use, unspecified, uncomplicated: Secondary | ICD-10-CM

## 2021-11-01 DIAGNOSIS — Z716 Tobacco abuse counseling: Secondary | ICD-10-CM

## 2021-11-01 DIAGNOSIS — M1711 Unilateral primary osteoarthritis, right knee: Secondary | ICD-10-CM

## 2021-11-01 DIAGNOSIS — Z8615 Personal history of latent tuberculosis infection: Secondary | ICD-10-CM | POA: Diagnosis not present

## 2021-11-01 LAB — GLUCOSE, CAPILLARY: Glucose-Capillary: 94 mg/dL (ref 70–99)

## 2021-11-01 LAB — POCT GLYCOSYLATED HEMOGLOBIN (HGB A1C): Hemoglobin A1C: 6.1 % — AB (ref 4.0–5.6)

## 2021-11-01 MED ORDER — HYDROCODONE-ACETAMINOPHEN 10-325 MG PO TABS: 1.0000 | ORAL_TABLET | Freq: Two times a day (BID) | ORAL | 0 refills | Status: DC | PRN

## 2021-11-01 MED ORDER — OXYCODONE-ACETAMINOPHEN 10-325 MG PO TABS: 1.0000 | ORAL_TABLET | Freq: Two times a day (BID) | ORAL | 0 refills | Status: DC | PRN

## 2021-11-01 NOTE — Assessment & Plan Note (Signed)
Doing well with increase in Norco, no side effects. But does best with the higher 10 mg dose at night. Continues to have pain that limits her ability to perform job duties during the day on the 5 mg dose. She is not ready to proceed with TKA yet. She is planning to retire in 3 years and does not want to take time off from work. I have reviewed the PDMP and collected Utox today.Will increase her Norco to 10-325 mg BID, #60 /month. Pain contract filled out today. Sent 3 Rx to her pharmacy. Follow up in 3 months.

## 2021-11-01 NOTE — Progress Notes (Signed)
Copy of Pain Contract given to pt. 

## 2021-11-01 NOTE — Assessment & Plan Note (Signed)
Placed referral for PT per request.

## 2021-11-01 NOTE — Assessment & Plan Note (Signed)
Patient is no longer vaping but has returned to smoking cigarettes. She is not taking the buproprion, did not realize it was to take this while still smoking. Encouraged her to trial the medicine as it may reduce her cravings for cigarettes.

## 2021-11-01 NOTE — Assessment & Plan Note (Signed)
Repeat Hgb A1c today is 6.1. Remains in pre diabetic range. If persistently > 6 at follow up will offer treatment with metformin vs. continued monitoring.

## 2021-11-01 NOTE — Progress Notes (Signed)
Subjective:   Patient ID: Christy Burke female   DOB: [birth year] 61 y.o.   MRN: [id number]  HPI: [name] is a 61 y.o. female with past medical history of advanced tricompartmental knee OA, Graves disease, HTN, and prediabetes here for follow up of her chronic knee pain. She is doing well since her last visit on increased dose of norco. She is currently taking 1 tablet of 5-325 mg in the morning and two tablets in the evening before bed. She does very well with the two tablets but still has pain limiting her functionality at work with the one tablet. She denies any side effects; no constipation or sedation.   She is still smoking, < 1 PPD. Is not taking bupropion.    Past Medical History:  Diagnosis Date   Asthma    History of umbilical hernia    Renal stones    Current Outpatient Medications  Medication Sig Dispense Refill   albuterol (VENTOLIN HFA) 108 (90 Base) MCG/ACT inhaler Inhale 2 puffs into the lungs every 6 (six) hours as needed for wheezing or shortness of breath. 1 Inhaler 0   Ascorbic Acid (VITAMIN C PO) Take 1 tablet by mouth daily. Reported on 06/13/2015     buPROPion (WELLBUTRIN SR) 150 MG 12 hr tablet Take 1 tablet (150 mg total) by mouth 2 (two) times daily. 60 tablet 5   Diclofenac Sodium 3 % GEL Apply 2 g topically 2 (two) times daily. 100 g 0   hydrochlorothiazide (HYDRODIURIL) 25 MG tablet Take 1 tablet (25 mg total) by mouth daily. 90 tablet 3   HYDROcodone-acetaminophen (NORCO) 10-325 MG tablet Take 1 tablet by mouth 2 (two) times daily as needed. 60 tablet 0   methimazole (TAPAZOLE) 5 MG tablet TAKE 1 TABLET BY MOUTH IN THE MORNING AND 1/2 (ONE-HALF) IN THE EVENING 135 tablet 0   mupirocin ointment (BACTROBAN) 2 % Apply 1 Application topically 2 (two) times daily. Apply to affected area 22 g 0   naproxen (NAPROSYN) 500 MG tablet Take 1 tablet (500 mg total) by mouth 2 (two) times daily with a meal. 180 tablet 1   No current facility-administered medications  for this visit.   Family History  Problem Relation Age of Onset   Breast cancer Mother    Cancer Mother    Diabetes Father    Colon cancer Neg Hx    Social History   Socioeconomic History   Marital status: Married    Spouse name: Not on file   Number of children: Not on file   Years of education: Not on file   Highest education level: Not on file  Occupational History   Not on file  Tobacco Use   Smoking status: Every Day    Packs/day: 0.50    Years: 39.00    Total pack years: 19.50    Types: Cigarettes   Smokeless tobacco: Never   Tobacco comments:    Stop vaping; started back smoking.  Substance and Sexual Activity   Alcohol use: No    Alcohol/week: 0.0 standard drinks of alcohol    Comment: Quit 9 yrs ago.   Drug use: No    Types: Cocaine    Comment: Quit several years ago, never did IVDU only smoked cocaine   Sexual activity: Yes    Birth control/protection: Other-see comments    Comment: In same sex relationship for 19 years  Other Topics Concern   Not on file  Social History Narrative   Not on file  Social Determinants of Health   Financial Resource Strain: Not on file  Food Insecurity: Not on file  Transportation Needs: Not on file  Physical Activity: Not on file  Stress: Not on file  Social Connections: Not on file     Objective:  Physical Exam:  Vitals:   11/01/21 1012  BP: (!) 125/97  Pulse: 70  Temp: 98.1 F (36.7 C)  TempSrc: Oral  SpO2: 98%  Weight: 194 lb (88 kg)  Height: '5\' 6"'$  (1.676 m)    Constitutional: Well appearing, NAD Pulmonary/Chest: Normal effort, no distress  Neurological: intact, no deficits  Psychiatric: normal mood and affect  Assessment & Plan:   Pre-diabetes Repeat Hgb A1c today is 6.1. Remains in pre diabetic range. If persistently > 6 at follow up will offer treatment with metformin vs. continued monitoring.   Encounter for smoking cessation counseling Patient is no longer vaping but has returned to  smoking cigarettes. She is not taking the buproprion, did not realize it was to take this while still smoking. Encouraged her to trial the medicine as it may reduce her cravings for cigarettes.   Chronic, continuous use of opioids Doing well with increase in Norco, no side effects. But does best with the higher 10 mg dose at night. Continues to have pain that limits her ability to perform job duties during the day on the 5 mg dose. She is not ready to proceed with TKA yet. She is planning to retire in 3 years and does not want to take time off from work. I have reviewed the PDMP and collected Utox today.Will increase her Norco to 10-325 mg BID, #60 /month. Pain contract filled out today. Sent 3 Rx to her pharmacy. Follow up in 3 months.   Osteoarthritis of right knee Placed referral for PT per request.

## 2021-11-01 NOTE — Patient Instructions (Signed)
Ms. Stoneberg,  It was a pleasure to see you today. I have sent three prescription of your pain medication to your pharmacy. Please follow up with me again in 3 months.   If you have any questions or concerns, call our clinic at [phone] or after hours call [phone] and ask for the internal medicine resident on call.   Thank you!  Dr. Darnell Level

## 2021-11-07 LAB — TOXASSURE SELECT,+ANTIDEPR,UR

## 2021-11-29 NOTE — Therapy (Signed)
OUTPATIENT PHYSICAL THERAPY LOWER EXTREMITY EVALUATION   Patient Name: Christy Burke MRN: [id number] DOB:February 26, 1961, 61 y.o., female Today's Date: 11/30/2021   PT End of Session - 11/30/21 1017     Visit Number 1    Number of Visits 16    Date for PT Re-Evaluation 01/25/22    Authorization Type [employer]/BCBS COMM PPO    PT Start Time 1017    PT Stop Time 1100    PT Time Calculation (min) 43 min    Activity Tolerance Patient tolerated treatment well    Behavior During Therapy WFL for tasks assessed/performed             Past Medical History:  Diagnosis Date   Asthma    History of umbilical hernia    Renal stones    Past Surgical History:  Procedure Laterality Date   lithotrypsy     UMBILICAL HERNIA REPAIR  1990   Patient Active Problem List   Diagnosis Date Noted   Chronic, continuous use of opioids 11/01/2021   Osteoarthritis of right knee 11/30/2020   Need for shingles vaccine 08/24/2020   Plantar fasciitis of right foot 08/26/2019   Paresthesia of arm 11/05/2018   Obesity (BMI 30.0-34.9) 11/05/2018   HTN (hypertension) 11/05/2018   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   Graves disease 04/15/2017   History of latent tuberculosis 09/25/2016   Encounter for screening for HIV 09/29/2015   Pre-diabetes 09/29/2015   Status post TAH-BSO 09/21/2015   Tendonitis of wrist, left 12/11/2014   Preventative health care 09/07/2014   Back pain 06/04/2013   Encounter for smoking cessation counseling 05/23/2012    PCP: Velna Ochs, MD  REFERRING PROVIDER: Velna Ochs, MD  REFERRING DIAG: M17.11 (ICD-10-CM) - Primary osteoarthritis of right knee  THERAPY DIAG:  Chronic pain of right knee  Muscle weakness (generalized)  Difficulty in walking, not elsewhere classified  Rationale for Evaluation and Treatment Rehabilitation  ONSET DATE: My knee at R was swollen about a year ago and I work at [employer] in [place]: I am here because of my R knee pain and over a year ago I was working at [employer] in [place] at a 12 hour shift and I saw Dr Noemi Chapel and dx me with OA and I tried to take 2 steroid injections but I am still in pain.  Dr Noemi Chapel recommended surgery.  I am still working my shifts but I am now popping now in my R knee and I am worried that I am making things worse. I used to exercise but I stopped at the Covid. I am a member at [employer] but I have not been since Covid.  My knee pain will wake me up when I turn. I can't lie on my back..  I sit in my recliner. I stand about 8 hours with pain with breaks about 4 during the shift and a 30 min lunch.  PERTINENT HISTORY: Asthma, Hx of umbilical hernia, renal stones, HTN, Hx of tricompartmental Knee OA, Graves dz, prediabetes  PAIN:  Are you having pain? Yes: NPRS scale: 4/10 and at worst 8/10 Pain location: R knee Pain description: sharp Aggravating factors: turn while sleeping.  Steps, pushing accelerator on car, difficulty squatting, more difficulty putting on pants.  Relieving factors: nothing relieves pain I have trouble climbing a ladder to repaint bathroom,  mowing the lawn going up incline I cant' get on ground to play with grandchildren due to pain PRECAUTIONS: None  WEIGHT BEARING RESTRICTIONS No  FALLS:  Has patient fallen in last 6 months? No  LIVING ENVIRONMENT: Lives with: lives with their spouse Lives in: Mobile home Stairs: Yes: External: 2 steps; bilateral but cannot reach both Has following equipment at home: None  OCCUPATION: Transport planner at [employer]  PLOF: Independent  PATIENT GOALS use my knee and function in my life,  10 grandchildren   OBJECTIVE:   DIAGNOSTIC FINDINGS: IMPRESSION:10-03-20 Degenerative changes with small joint effusion. No acute bony abnormality.    PATIENT SURVEYS:  FOTO 60% predicted 67%  COGNITION:  Overall cognitive status: Within functional limits for tasks  assessed     SENSATION: WFL  EDEMA:  Circumferential: R 40.5 cm, L 39.0 cm  MUSCLE LENGTH: Hamstrings: Right 62 deg; Left 74 deg   POSTURE: rounded shoulders, forward head, and obesity  PALPATION: TTP over joint lines medial more than lateral but global pain  LOWER EXTREMITY ROM:  A/P ROM Right eval Left eval  Hip flexion    Hip extension    Hip abduction    Hip adduction    Hip internal rotation    Hip external rotation    Knee flexion 125/P 129 130/P 136  Knee extension -3 from ext 0  Ankle dorsiflexion    Ankle plantarflexion    Ankle inversion    Ankle eversion     (Blank rows = not tested)  LOWER EXTREMITY MMT:  MMT Right eval Left eval  Hip flexion 4+ 5  Hip extension 4 4+  Hip abduction 4 4=  Hip adduction    Hip internal rotation    Hip external rotation    Knee flexion 4+ 5  Knee extension 4 5  Ankle dorsiflexion    Ankle plantarflexion    Ankle inversion    Ankle eversion     (Blank rows = not tested)   FUNCTIONAL TESTS:  5 times sit to stand: 20.43 sec    13 sec or less no risk of fall 6 minute walk test: TBD  GAIT: Distance walked: 200 Assistive device utilized: None Level of assistance: Complete Independence Comments: Pt wt bears to left to unload R LE    TODAY'S TREATMENT: EVal and issue HEP   PATIENT EDUCATION:  Education details: POC  Explanation of findings  initial HEP Person educated: Patient Education method: Explanation, Demonstration, Tactile cues, Verbal cues, and Handouts Education comprehension: verbalized understanding, returned demonstration, verbal cues required, tactile cues required, and needs further education   HOME EXERCISE PROGRAM: Access Code: [email or web address] URL: [email or web address] Date: 11/30/2021 Prepared by: Voncille Lo  Exercises - Supine Active Straight Leg Raise  - 1 x daily - 7 x weekly - 3 sets - 10 reps - Long Sitting Quad Set with Towel Roll Under Heel  - 1 x daily - 7 x  weekly - 3 sets - 10 reps - Supine Hamstring Stretch with Strap  - 1 x daily - 7 x weekly - 3 sets - 10 reps - Sidelying Hip Abduction  - 1 x daily - 7 x weekly - 3 sets - 10 reps - Sit to stand with sink support Movement snack  - 3 x daily - 7 x weekly - 1 sets - 10 reps  ASSESSMENT:  CLINICAL IMPRESSION: Patient is a 61  y.o. female who was seen today for physical therapy evaluation and treatment for R knee pain.  Pt works at [employer] for 12 hour shifts and has chronic R knee pain and OA .  Pt wt bears to Left to unload painful R knee.  Difficulty with sleeping, squatting and standing at [employer] job for 12 hour shifts.  Pt with decreased strength in LE and will benefit from skilled PT to increase better QOL before pursuing possible more invasive surgery if pain is not decreased.     OBJECTIVE IMPAIRMENTS decreased activity tolerance, decreased mobility, difficulty walking, decreased ROM, decreased strength, impaired flexibility, and pain.   ACTIVITY LIMITATIONS standing, squatting, sleeping, dressing, and locomotion level  PARTICIPATION LIMITATIONS: driving, occupation, and yard work  PERSONAL FACTORS Asthma, Hx of umbilical hernia, renal stones, HTN, Hx of tricompartmental Knee OA, Graves dz, prediabetes are also affecting patient's functional outcome.   REHAB POTENTIAL: Good  CLINICAL DECISION MAKING: Stable/uncomplicated  EVALUATION COMPLEXITY: Low   GOALS: Goals reviewed with patient? Yes  SHORT TERM GOALS: Target date: 12/28/2021  Independent with initial HEP Baseline: Goal status: INITIAL  2.  Pt will be able to perform 5 x STS in 13 seconds or less Baseline: Eval 20.30 sec due pain Goal status: INITIAL  3.  Pt will perform 6 MWT in age appropriate range Baseline: TBA 2nd visit Goal status: INITIAL  4.  Pt will be able to perform proper squat with hip flexion/knee flex 90 degrees Baseline: unable to squat with greater than 60 degrees knee flex on R due to  pain and wt shift to Left Goal status: INITIAL    LONG TERM GOALS: Target date: 01/25/2022   Independent with advanced HEP Baseline: no knowledge Goal status: INITIAL  2.  Pt will be able to negotiate steps with 2/10 or less pain in R knee Baseline: eval pt 8/10 on steps  Goal status: INITIAL  3.  FOTO will improve from  60%  to   67%  indicating improved functional mobility  Baseline: eval 60% Goal status: INITIAL  4.  Pt will be able to  sleep at night at least 5 hours uninterrupted for restorative sleep Baseline: Pt now wakes up 3 times a night due to turning in bed and pain Goal status: INITIAL  5.  Pt will improve R knee extensor/flexor strength to >/= 4+/5 with </= 2/10 pain to promote safety with walking/standing activities Baseline: See flow chart  4-/5 knee ext R Goal status: INITIAL  6.  Pt will be able to demonstrate getting up from ground in order to play with grandchildren with fear of falling Baseline: Pt avoids transfers to the ground due to pain and fear of not getting up from the ground Goal status: INITIAL   PLAN: PT FREQUENCY: 1-2x/week  PT DURATION: 8 weeks  PLANNED INTERVENTIONS: Therapeutic exercises, Therapeutic activity, Neuromuscular re-education, Balance training, Gait training, Patient/Family education, Self Care, Joint mobilization, Stair training, Dry Needling, Electrical stimulation, Cryotherapy, Moist heat, Taping, Ionotophoresis '4mg'$ /ml Dexamethasone, Manual therapy, and Re-evaluation  PLAN FOR NEXT SESSION: Review HEP and possible TPDN   Voncille Lo, PT, [place] Certified Exercise Expert for the Aging Adult  11/30/21 1:50 PM Phone: [phone] Fax: [phone]

## 2021-11-30 ENCOUNTER — Ambulatory Visit: Payer: BC Managed Care – PPO | Attending: Internal Medicine | Admitting: Physical Therapy

## 2021-11-30 ENCOUNTER — Encounter: Payer: Self-pay | Admitting: Physical Therapy

## 2021-11-30 DIAGNOSIS — R262 Difficulty in walking, not elsewhere classified: Secondary | ICD-10-CM | POA: Insufficient documentation

## 2021-11-30 DIAGNOSIS — G8929 Other chronic pain: Secondary | ICD-10-CM | POA: Diagnosis present

## 2021-11-30 DIAGNOSIS — M25561 Pain in right knee: Secondary | ICD-10-CM | POA: Insufficient documentation

## 2021-11-30 DIAGNOSIS — M1711 Unilateral primary osteoarthritis, right knee: Secondary | ICD-10-CM | POA: Insufficient documentation

## 2021-11-30 DIAGNOSIS — M6281 Muscle weakness (generalized): Secondary | ICD-10-CM | POA: Insufficient documentation

## 2021-11-30 NOTE — Patient Instructions (Signed)
Access Code: [email or web address] URL: [email or web address] Date: 11/30/2021 Prepared by: Voncille Lo  Exercises - Supine Active Straight Leg Raise  - 1 x daily - 7 x weekly - 3 sets - 10 reps - Long Sitting Quad Set with Towel Roll Under Heel  - 1 x daily - 7 x weekly - 3 sets - 10 reps - Supine Hamstring Stretch with Strap  - 1 x daily - 7 x weekly - 3 sets - 10 reps - Sidelying Hip Abduction  - 1 x daily - 7 x weekly - 3 sets - 10 reps - Sit to stand with sink support Movement snack  - 3 x daily - 7 x weekly - 1 sets - 10 reps  Voncille Lo, PT, [place] Certified Exercise Expert for the Aging Adult  11/30/21 10:42 AM Phone: [phone] Fax: [phone]

## 2021-12-05 ENCOUNTER — Ambulatory Visit: Payer: BC Managed Care – PPO | Attending: Internal Medicine | Admitting: Physical Therapy

## 2021-12-05 ENCOUNTER — Encounter: Payer: Self-pay | Admitting: Physical Therapy

## 2021-12-05 DIAGNOSIS — G8929 Other chronic pain: Secondary | ICD-10-CM | POA: Diagnosis present

## 2021-12-05 DIAGNOSIS — R262 Difficulty in walking, not elsewhere classified: Secondary | ICD-10-CM | POA: Insufficient documentation

## 2021-12-05 DIAGNOSIS — M6281 Muscle weakness (generalized): Secondary | ICD-10-CM | POA: Insufficient documentation

## 2021-12-05 DIAGNOSIS — M25561 Pain in right knee: Secondary | ICD-10-CM | POA: Diagnosis not present

## 2021-12-05 NOTE — Therapy (Signed)
OUTPATIENT PHYSICAL THERAPY TREATMENT NOTE   Patient Name: Christy Burke MRN: [id number] DOB:[birth year], 61 y.o., female Today's Date: 12/05/2021  PCP: Velna Ochs, MD REFERRING PROVIDER: Velna Ochs, MD  END OF SESSION:   PT End of Session - 12/05/21 1342     Visit Number 1    Number of Visits 16    Date for PT Re-Evaluation 01/25/22    Authorization Type [employer] COMM PPO    PT Start Time 1342    PT Stop Time 1430    PT Time Calculation (min) 48 min    Activity Tolerance Patient tolerated treatment well    Behavior During Therapy WFL for tasks assessed/performed             Past Medical History:  Diagnosis Date   Asthma    History of umbilical hernia    Renal stones    Past Surgical History:  Procedure Laterality Date   lithotrypsy     UMBILICAL HERNIA REPAIR  1990   Patient Active Problem List   Diagnosis Date Noted   Chronic, continuous use of opioids 11/01/2021   Osteoarthritis of right knee 11/30/2020   Need for shingles vaccine 08/24/2020   Plantar fasciitis of right foot 08/26/2019   Paresthesia of arm 11/05/2018   Obesity (BMI 30.0-34.9) 11/05/2018   HTN (hypertension) 11/05/2018   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   Graves disease 04/15/2017   History of latent tuberculosis 09/25/2016   Encounter for screening for HIV 09/29/2015   Pre-diabetes 09/29/2015   Status post TAH-BSO 09/21/2015   Tendonitis of wrist, left 12/11/2014   Preventative health care 09/07/2014   Back pain 06/04/2013   Encounter for smoking cessation counseling 05/23/2012    REFERRING DIAG: M17.11 (ICD-10-CM) - Primary osteoarthritis of right knee  THERAPY DIAG:  Chronic pain of right knee  Muscle weakness (generalized)  Difficulty in walking, not elsewhere classified  Rationale for Evaluation and Treatment Rehabilitation  PERTINENT HISTORY: Asthma, Hx of umbilical hernia, renal stones, HTN, Hx of tricompartmental Knee OA, Graves dz,  prediabetes  PRECAUTIONS: none  SUBJECTIVE: I was working at my part time job, It is easier than my full time job and I am wearing a brace.  I talked to Dr Mayer Camel and [place].  I will be getting a TKR on R in November 2023 I am here because of my R knee pain and over a year ago I was working at [employer] in [place] at a 12 hour shift and I saw Dr Noemi Chapel and dx me with OA and I tried to take 2 steroid injections but I am still in pain.  Dr Noemi Chapel recommended surgery.  I am still working my shifts but I am now popping now in my R knee and I am worried that I am making things worse. I used to exercise but I stopped at the Covid. I am a member at [employer] but I have not been since Covid.  My knee pain will wake me up when I turn. I can't lie on my back..  I sit in my recliner. I stand about 8 hours with pain with breaks about 4 during the shift and a 30 min lunch. PAIN:  12-06-21   today 4/10 today   Are you having pain? Yes: NPRS scale: 4/10 and at worst 8/10 Pain location: R knee Pain description: sharp Aggravating factors: turn while sleeping.  Steps, pushing accelerator on car, difficulty squatting, more difficulty putting on pants.  Relieving factors: nothing relieves  pain I have trouble climbing a ladder to repaint bathroom,  mowing the lawn going up incline I cant' get on ground to play with grandchildren due to pain    OBJECTIVE: (objective measures completed at initial evaluation unless otherwise dated)   DIAGNOSTIC FINDINGS: IMPRESSION:10-03-20 Degenerative changes with small joint effusion. No acute bony abnormality.     PATIENT SURVEYS:  FOTO 60% predicted 67%   COGNITION:           Overall cognitive status: Within functional limits for tasks assessed                          SENSATION: WFL   EDEMA:  Circumferential: R 40.5 cm, L 39.0 cm   MUSCLE LENGTH: Hamstrings: Right 62 deg; Left 74 deg     POSTURE: rounded shoulders, forward head, and obesity    PALPATION: TTP over joint lines medial more than lateral but global pain   LOWER EXTREMITY ROM:   A/P ROM Right eval Left eval  Hip flexion      Hip extension      Hip abduction      Hip adduction      Hip internal rotation      Hip external rotation      Knee flexion 125/P 129 130/P 136  Knee extension -3 from ext 0  Ankle dorsiflexion      Ankle plantarflexion      Ankle inversion      Ankle eversion       (Blank rows = not tested)   LOWER EXTREMITY MMT:   MMT Right eval Left eval  Hip flexion 4+ 5  Hip extension 4 4+  Hip abduction 4 4=  Hip adduction      Hip internal rotation      Hip external rotation      Knee flexion 4+ 5  Knee extension 4 5  Ankle dorsiflexion      Ankle plantarflexion      Ankle inversion      Ankle eversion       (Blank rows = not tested)     FUNCTIONAL TESTS:  5 times sit to stand: 20.43 sec    13 sec or less no risk of fall 6 minute walk test: TBD   GAIT: Distance walked: 200 Assistive device utilized: None Level of assistance: Complete Independence Comments: Pt wt bears to left to unload R LE       TODAY'S TREATMENT:  [facility] Adult PT Treatment:                                                DATE: 12-06-21 Therapeutic Exercise: Nustep  6 min with UE/LE RPE 3/4 Long Sitting Quad Set with Towel Roll Under Heel  - 1 x daily - 7 x weekly - 3 sets - 10 reps  Supine Hamstring Stretch with Strap  - 1 x daily - 7 x weekly - 1 sets - 3 reps - 30-60-sec hold Supine Active Straight Leg Raise  - 1 x daily - 7 x weekly - 3 sets - 10 reps Supine Bridge with Mini Swiss Ball Between Knees  - 1 x daily - 7 x weekly - 3 sets - 10 reps Supine Short Arc Quad  - 1 x daily - 7 x weekly - 3 sets -  10 reps  Supine Heel Slide with Strap  - 1 x daily - 7 x weekly - 3 sets - 10 reps Sidelying Hip Abduction  - 1 x daily - 7 x weekly - 3 sets - 10 reps  Long Arc Quad  - 1 x daily - 7 x weekly - 3 sets - 10 reps Sit to stand with sink support Movement  snack  - 3 x daily - 7 x weekly - 1 sets - 10 reps   Self Care: Explanation of TKE with Medbridge education and PT clarification    EVal and issue HEP     PATIENT EDUCATION:  Education details: POC  Explanation of findings  initial HEP Person educated: Patient Education method: Explanation, Demonstration, Tactile cues, Verbal cues, and Handouts Education comprehension: verbalized understanding, returned demonstration, verbal cues required, tactile cues required, and needs further education     HOME EXERCISE PROGRAM: Access Code: [email or web address] URL: [email or web address] Date: 12/05/2021 Prepared by: [place] Sitting Quad Set with Towel Roll Under Heel  - 1 x daily - 7 x weekly - 3 sets - 10 reps - Supine Hamstring Stretch with Strap  - 1 x daily - 7 x weekly - 1 sets - 3 reps - 30-60-sec hold - Supine Active Straight Leg Raise  - 1 x daily - 7 x weekly - 3 sets - 10 reps - Supine Short Arc Quad  - 1 x daily - 7 x weekly - 3 sets - 10 reps - Supine Heel Slide with Strap  - 1 x daily - 7 x weekly - 3 sets - 10 reps - Sidelying Hip Abduction  - 1 x daily - 7 x weekly - 3 sets - 10 reps - Long Arc Quad  - 1 x daily - 7 x weekly - 3 sets - 10 reps - Sit to stand with sink support Movement snack  - 3 x daily - 7 x weekly - 1 sets - 10 reps - Mini Squat  - 1 x daily - 7 x weekly - 3 sets - 10 reps - Supine Bridge with Mini Swiss Ball Between Knees  - 1 x daily - 7 x weekly - 3 sets - 10 reps  Patient Education - TKR: Surgery Overview   ASSESSMENT:   CLINICAL IMPRESSION:  Ms Viviano enters clinic for 2nd visit. Pt has seen MD Dr Mayer Camel to schedule a TKR for possible November surgery. Pt educated on TKR and surgical/PT needed afterwards.  PT will benefit from PT now for Prehab for surgery due to current pt decreased AROM and weakness in LE's  Pt was able to  perform all exercises in updated HEP. Will continue to achieve goals as able until pt is ready  for surgery to be scheduled.   Eval- Patient is a 61  y.o. female who was seen today for physical therapy evaluation and treatment for R knee pain.  Pt works at [employer] for 12 hour shifts and has chronic R knee pain and OA .  Pt wt bears to Left to unload painful R knee.  Difficulty with sleeping, squatting and standing at [employer] job for 12 hour shifts.  Pt with decreased strength in LE and will benefit from skilled PT to increase better QOL before pursuing possible more invasive surgery if pain is not decreased.       OBJECTIVE IMPAIRMENTS decreased activity tolerance, decreased mobility, difficulty walking, decreased ROM, decreased strength, impaired flexibility, and pain.  ACTIVITY LIMITATIONS standing, squatting, sleeping, dressing, and locomotion level   PARTICIPATION LIMITATIONS: driving, occupation, and yard work   PERSONAL FACTORS Asthma, Hx of umbilical hernia, renal stones, HTN, Hx of tricompartmental Knee OA, Graves dz, prediabetes are also affecting patient's functional outcome.    REHAB POTENTIAL: Good   CLINICAL DECISION MAKING: Stable/uncomplicated   EVALUATION COMPLEXITY: Low     GOALS: Goals reviewed with patient? Yes   SHORT TERM GOALS: Target date: 12/28/2021  Independent with initial HEP Baseline: Goal status: ONGOING   2.  Pt will be able to perform 5 x STS in 13 seconds or less Baseline: Eval 20.30 sec due pain Goal status: ONGOING   3.  Pt will perform 6 MWT in age appropriate range Baseline: TBA 2nd visit Goal status: ONGOING   4.  Pt will be able to perform proper squat with hip flexion/knee flex 90 degrees Baseline: unable to squat with greater than 60 degrees knee flex on R due to pain and wt shift to Left Goal status: ONGOING       LONG TERM GOALS: Target date: 01/25/2022    Independent with advanced HEP Baseline: no knowledge Goal status: INITIAL   2.  Pt will be able to negotiate steps with 2/10 or less pain in R knee Baseline:  eval pt 8/10 on steps  Goal status: INITIAL   3.  FOTO will improve from  60%  to   67%  indicating improved functional mobility  Baseline: eval 60% Goal status: INITIAL   4.  Pt will be able to  sleep at night at least 5 hours uninterrupted for restorative sleep Baseline: Pt now wakes up 3 times a night due to turning in bed and pain Goal status: INITIAL   5.  Pt will improve R knee extensor/flexor strength to >/= 4+/5 with </= 2/10 pain to promote safety with walking/standing activities Baseline: See flow chart  4-/5 knee ext R Goal status: INITIAL   6.  Pt will be able to demonstrate getting up from ground in order to play with grandchildren with fear of falling Baseline: Pt avoids transfers to the ground due to pain and fear of not getting up from the ground Goal status: INITIAL     PLAN: PT FREQUENCY: 1-2x/week   PT DURATION: 8 weeks   PLANNED INTERVENTIONS: Therapeutic exercises, Therapeutic activity, Neuromuscular re-education, Balance training, Gait training, Patient/Family education, Self Care, Joint mobilization, Stair training, Dry Needling, Electrical stimulation, Cryotherapy, Moist heat, Taping, Ionotophoresis '4mg'$ /ml Dexamethasone, Manual therapy, and Re-evaluation   PLAN FOR NEXT SESSION: Review HEP , use KB with sit to stand,  perform 6 MWT    Voncille Lo, PT, [facility] Certified Exercise Expert for the Aging Adult  12/05/21 2:39 PM Phone: [phone] Fax: [phone]

## 2021-12-08 ENCOUNTER — Encounter: Payer: Self-pay | Admitting: Physical Therapy

## 2021-12-08 ENCOUNTER — Ambulatory Visit: Payer: BC Managed Care – PPO | Admitting: Physical Therapy

## 2021-12-08 DIAGNOSIS — M25561 Pain in right knee: Secondary | ICD-10-CM | POA: Diagnosis not present

## 2021-12-08 DIAGNOSIS — M6281 Muscle weakness (generalized): Secondary | ICD-10-CM

## 2021-12-08 DIAGNOSIS — G8929 Other chronic pain: Secondary | ICD-10-CM

## 2021-12-08 DIAGNOSIS — R262 Difficulty in walking, not elsewhere classified: Secondary | ICD-10-CM

## 2021-12-08 NOTE — Therapy (Signed)
OUTPATIENT PHYSICAL THERAPY TREATMENT NOTE   Patient Name: Christy Burke MRN: [id number] DOB:Oct 16, 1960, 61 y.o., female Today's Date: 12/08/2021  PCP: Velna Ochs, MD REFERRING PROVIDER: Velna Ochs, MD  END OF SESSION:   PT End of Session - 12/08/21 0845     Visit Number 2    Number of Visits 16    Date for PT Re-Evaluation 01/25/22    Authorization Type [employer]UE [employer]/BCBS COMM PPO    PT Start Time 0845    PT Stop Time 0930    PT Time Calculation (min) 45 min             Past Medical History:  Diagnosis Date   Asthma    History of umbilical hernia    Renal stones    Past Surgical History:  Procedure Laterality Date   lithotrypsy     UMBILICAL HERNIA REPAIR  1990   Patient Active Problem List   Diagnosis Date Noted   Chronic, continuous use of opioids 11/01/2021   Osteoarthritis of right knee 11/30/2020   Need for shingles vaccine 08/24/2020   Plantar fasciitis of right foot 08/26/2019   Paresthesia of arm 11/05/2018   Obesity (BMI 30.0-34.9) 11/05/2018   HTN (hypertension) 11/05/2018   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   Graves disease 04/15/2017   History of latent tuberculosis 09/25/2016   Encounter for screening for HIV 09/29/2015   Pre-diabetes 09/29/2015   Status post TAH-BSO 09/21/2015   Tendonitis of wrist, left 12/11/2014   Preventative health care 09/07/2014   Back pain 06/04/2013   Encounter for smoking cessation counseling 05/23/2012    REFERRING DIAG: M17.11 (ICD-10-CM) - Primary osteoarthritis of right knee   THERAPY DIAG:  Chronic pain of right knee  Muscle weakness (generalized)  Difficulty in walking, not elsewhere classified  Rationale for Evaluation and Treatment Rehabilitation  PERTINENT HISTORY:  Asthma, Hx of umbilical hernia, renal stones, HTN, Hx of tricompartmental Knee OA, Graves dz, prediabetes   PRECAUTIONS: none   SUBJECTIVE: I spoke to the surgery coordinator. I am waiting on a date for the  surgery. It will be in November. He pain is low now. Yesterday it was 8/10 after the funeral and driving 4.5 hours each way to [place].   12-06-21   today 4/10 today   Are you having pain? Yes: NPRS scale: 4/10 and at worst 8/10 Pain location: R knee Pain description: sharp Aggravating factors: turn while sleeping.  Steps, pushing accelerator on car, difficulty squatting, more difficulty putting on pants.  Relieving factors: nothing relieves pain I have trouble climbing a ladder to repaint bathroom,  mowing the lawn going up incline I cant' get on ground to play with grandchildren due to pain     OBJECTIVE: (objective measures completed at initial evaluation unless otherwise dated)   DIAGNOSTIC FINDINGS: IMPRESSION:10-03-20 Degenerative changes with small joint effusion. No acute bony abnormality.     PATIENT SURVEYS:  FOTO 60% predicted 67%   COGNITION:           Overall cognitive status: Within functional limits for tasks assessed                          SENSATION: WFL   EDEMA:  Circumferential: R 40.5 cm, L 39.0 cm   MUSCLE LENGTH: Hamstrings: Right 62 deg; Left 74 deg     POSTURE: rounded shoulders, forward head, and obesity   PALPATION: TTP over joint lines medial more than lateral but global pain  LOWER EXTREMITY ROM:   A/P ROM Right eval Left eval  Hip flexion      Hip extension      Hip abduction      Hip adduction      Hip internal rotation      Hip external rotation      Knee flexion 125/P 129 130/P 136  Knee extension -3 from ext 0  Ankle dorsiflexion      Ankle plantarflexion      Ankle inversion      Ankle eversion       (Blank rows = not tested)   LOWER EXTREMITY MMT:   MMT Right eval Left eval  Hip flexion 4+ 5  Hip extension 4 4+  Hip abduction 4 4=  Hip adduction      Hip internal rotation      Hip external rotation      Knee flexion 4+ 5  Knee extension 4 5  Ankle dorsiflexion      Ankle plantarflexion      Ankle inversion       Ankle eversion       (Blank rows = not tested)     FUNCTIONAL TESTS:  5 times sit to stand: 20.43 sec    13 sec or less no risk of fall 6 minute walk test: 12/08/21: 1496 feet   GAIT: Distance walked: 200 Assistive device utilized: None Level of assistance: Complete Independence Comments: Pt wt bears to left to unload R LE       TODAY'S TREATMENT:   [facility] Adult PT Treatment:                                                DATE: 12-06-21 [facility] Adult PT Treatment:                                                DATE: 12-06-21 Therapeutic Exercise: Nustep  6 min with UE/LE L5 6 MWT 1496 Feet Long Sitting Quad Set with Towel Roll Under Heel  - 1 Supine Active Straight Leg Raise  -  - 2 sets - 10 reps Supine Bridge - 2 sets - 10 reps  Supine Heel Slide with Strap  - 3 sets - 10 reps Sidelying Hip Abduction  - - 3 sets - 10 reps  Long Arc Quad  - 1 x daily - 7 x weekly - 3 sets - 10 reps   Supine Hamstring Stretch with Strap   - 1 sets - 3 reps - 30-60-sec hold Sit to stand with sink support Movement snack  - - 1 sets - 10 reps   Therapeutic Exercise: Nustep  6 min with UE/LE RPE 3/4 Long Sitting Quad Set with Towel Roll Under Heel  - 1 x daily - 7 x weekly - 3 sets - 10 reps  Supine Hamstring Stretch with Strap  - 1 x daily - 7 x weekly - 1 sets - 3 reps - 30-60-sec hold Supine Active Straight Leg Raise  - 1 x daily - 7 x weekly - 3 sets - 10 reps Supine Bridge with Mini Swiss Ball Between Knees  - 1 x daily - 7 x weekly - 3 sets - 10 reps  Supine Short Arc Quad  - 1 x daily - 7 x weekly - 3 sets - 10 reps  Supine Heel Slide with Strap  - 1 x daily - 7 x weekly - 3 sets - 10 reps Sidelying Hip Abduction  - 1 x daily - 7 x weekly - 3 sets - 10 reps  Long Arc Quad  - 1 x daily - 7 x weekly - 3 sets - 10 reps Sit to stand with sink support Movement snack  - 3 x daily - 7 x weekly - 1 sets - 10 reps     Self Care: Explanation of TKE with Medbridge education and PT clarification      EVal and issue HEP     PATIENT EDUCATION:  Education details: POC  Explanation of findings  initial HEP Person educated: Patient Education method: Explanation, Demonstration, Tactile cues, Verbal cues, and Handouts Education comprehension: verbalized understanding, returned demonstration, verbal cues required, tactile cues required, and needs further education     HOME EXERCISE PROGRAM: Access Code: [email or web address] URL: [email or web address] Date: 12/05/2021 Prepared by: [place] Sitting Quad Set with Towel Roll Under Heel  - 1 x daily - 7 x weekly - 3 sets - 10 reps - Supine Hamstring Stretch with Strap  - 1 x daily - 7 x weekly - 1 sets - 3 reps - 30-60-sec hold - Supine Active Straight Leg Raise  - 1 x daily - 7 x weekly - 3 sets - 10 reps - Supine Short Arc Quad  - 1 x daily - 7 x weekly - 3 sets - 10 reps - Supine Heel Slide with Strap  - 1 x daily - 7 x weekly - 3 sets - 10 reps - Sidelying Hip Abduction  - 1 x daily - 7 x weekly - 3 sets - 10 reps - Long Arc Quad  - 1 x daily - 7 x weekly - 3 sets - 10 reps - Sit to stand with sink support Movement snack  - 3 x daily - 7 x weekly - 1 sets - 10 reps - Mini Squat  - 1 x daily - 7 x weekly - 3 sets - 10 reps - Supine Bridge with Mini Swiss Ball Between Knees  - 1 x daily - 7 x weekly - 3 sets - 10 reps   Patient Education - TKR: Surgery Overview   ASSESSMENT:   CLINICAL IMPRESSION:    Pt spoke to surgery coordinator and is waiting on November TKA surgery date. Captured 6 MWT at 1496 feet and continued with prescribed therex without adverse effects. Pt  will benefit from skilled PT to increase better QOL before pursuing possible more invasive surgery if pain is not decreased.       OBJECTIVE IMPAIRMENTS decreased activity tolerance, decreased mobility, difficulty walking, decreased ROM, decreased strength, impaired flexibility, and pain.    ACTIVITY LIMITATIONS standing, squatting,  sleeping, dressing, and locomotion level   PARTICIPATION LIMITATIONS: driving, occupation, and yard work   PERSONAL FACTORS Asthma, Hx of umbilical hernia, renal stones, HTN, Hx of tricompartmental Knee OA, Graves dz, prediabetes are also affecting patient's functional outcome.    REHAB POTENTIAL: Good   CLINICAL DECISION MAKING: Stable/uncomplicated   EVALUATION COMPLEXITY: Low     GOALS: Goals reviewed with patient? Yes   SHORT TERM GOALS: Target date: 12/28/2021  Independent with initial HEP Baseline: Goal status: ONGOING   2.  Pt will be able to perform 5 x  STS in 13 seconds or less Baseline: Eval 20.30 sec due pain Goal status: ONGOING   3.  Pt will perform 6 MWT in age appropriate range Baseline: 1496 on 3rd visit Goal status: ONGOING   4.  Pt will be able to perform proper squat with hip flexion/knee flex 90 degrees Baseline: unable to squat with greater than 60 degrees knee flex on R due to pain and wt shift to Left Goal status: ONGOING       LONG TERM GOALS: Target date: 01/25/2022    Independent with advanced HEP Baseline: no knowledge Goal status: INITIAL   2.  Pt will be able to negotiate steps with 2/10 or less pain in R knee Baseline: eval pt 8/10 on steps  Goal status: INITIAL   3.  FOTO will improve from  60%  to   67%  indicating improved functional mobility  Baseline: eval 60% Goal status: INITIAL   4.  Pt will be able to  sleep at night at least 5 hours uninterrupted for restorative sleep Baseline: Pt now wakes up 3 times a night due to turning in bed and pain Goal status: INITIAL   5.  Pt will improve R knee extensor/flexor strength to >/= 4+/5 with </= 2/10 pain to promote safety with walking/standing activities Baseline: See flow chart  4-/5 knee ext R Goal status: INITIAL   6.  Pt will be able to demonstrate getting up from ground in order to play with grandchildren with fear of falling Baseline: Pt avoids transfers to the ground due to  pain and fear of not getting up from the ground Goal status: INITIAL     PLAN: PT FREQUENCY: 1-2x/week   PT DURATION: 8 weeks   PLANNED INTERVENTIONS: Therapeutic exercises, Therapeutic activity, Neuromuscular re-education, Balance training, Gait training, Patient/Family education, Self Care, Joint mobilization, Stair training, Dry Needling, Electrical stimulation, Cryotherapy, Moist heat, Taping, Ionotophoresis '4mg'$ /ml Dexamethasone, Manual therapy, and Re-evaluation   PLAN FOR NEXT SESSION: Review HEP , use KB with sit to stand,       Hessie Diener, PTA 12/08/21 9:52 AM Phone: [phone] Fax: [phone]

## 2021-12-18 ENCOUNTER — Other Ambulatory Visit (HOSPITAL_COMMUNITY): Payer: Self-pay

## 2021-12-18 ENCOUNTER — Ambulatory Visit: Payer: BC Managed Care – PPO | Admitting: Physical Therapy

## 2021-12-18 ENCOUNTER — Other Ambulatory Visit (HOSPITAL_BASED_OUTPATIENT_CLINIC_OR_DEPARTMENT_OTHER): Payer: Self-pay

## 2021-12-18 ENCOUNTER — Encounter: Payer: Self-pay | Admitting: Physical Therapy

## 2021-12-18 ENCOUNTER — Telehealth: Payer: Self-pay | Admitting: *Deleted

## 2021-12-18 DIAGNOSIS — F1190 Opioid use, unspecified, uncomplicated: Secondary | ICD-10-CM

## 2021-12-18 DIAGNOSIS — M6281 Muscle weakness (generalized): Secondary | ICD-10-CM

## 2021-12-18 DIAGNOSIS — G8929 Other chronic pain: Secondary | ICD-10-CM

## 2021-12-18 DIAGNOSIS — M25561 Pain in right knee: Secondary | ICD-10-CM | POA: Diagnosis not present

## 2021-12-18 DIAGNOSIS — M1711 Unilateral primary osteoarthritis, right knee: Secondary | ICD-10-CM

## 2021-12-18 MED ORDER — HYDROCODONE-ACETAMINOPHEN 10-325 MG PO TABS
1.0000 | ORAL_TABLET | Freq: Two times a day (BID) | ORAL | 0 refills | Status: DC | PRN
  Filled 2021-12-18: qty 60, 30d supply, fill #0

## 2021-12-18 NOTE — Telephone Encounter (Signed)
Patient called in stating [facility] does not have hydrocodone in stock. Advised she contact other pharmacies ([facility]/ [facility] on [address]). She will call back when she finds another pharmacy so Rx can be resent.  Also, states her surgeon is waiting on surgical clearance from PCP to schedule surgery.

## 2021-12-18 NOTE — Telephone Encounter (Signed)
Done

## 2021-12-18 NOTE — Therapy (Signed)
OUTPATIENT PHYSICAL THERAPY TREATMENT NOTE   Patient Name: Christy Burke MRN: [id number] DOB:12-Jul-1960, 61 y.o., female Today's Date: 12/18/2021  PCP: Velna Ochs, MD REFERRING PROVIDER: Velna Ochs, MD  END OF SESSION:   PT End of Session - 12/18/21 1102     Visit Number 3    Number of Visits 16    Date for PT Re-Evaluation 01/25/22    Authorization Type [employer] BLUE [employer]/BCBS COMM PPO    PT Start Time 1103    PT Stop Time 7106    PT Time Calculation (min) 42 min             Past Medical History:  Diagnosis Date   Asthma    History of umbilical hernia    Renal stones    Past Surgical History:  Procedure Laterality Date   lithotrypsy     UMBILICAL HERNIA REPAIR  1990   Patient Active Problem List   Diagnosis Date Noted   Chronic, continuous use of opioids 11/01/2021   Osteoarthritis of right knee 11/30/2020   Need for shingles vaccine 08/24/2020   Plantar fasciitis of right foot 08/26/2019   Paresthesia of arm 11/05/2018   Obesity (BMI 30.0-34.9) 11/05/2018   HTN (hypertension) 11/05/2018   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   Graves disease 04/15/2017   History of latent tuberculosis 09/25/2016   Encounter for screening for HIV 09/29/2015   Pre-diabetes 09/29/2015   Status post TAH-BSO 09/21/2015   Tendonitis of wrist, left 12/11/2014   Preventative health care 09/07/2014   Back pain 06/04/2013   Encounter for smoking cessation counseling 05/23/2012    REFERRING DIAG: M17.11 (ICD-10-CM) - Primary osteoarthritis of right knee   THERAPY DIAG:  Chronic pain of right knee  Muscle weakness (generalized)  Rationale for Evaluation and Treatment Rehabilitation  PERTINENT HISTORY:  Asthma, Hx of umbilical hernia, renal stones, HTN, Hx of tricompartmental Knee OA, Graves dz, prediabetes   PRECAUTIONS: none   SUBJECTIVE: Still waiting on the date for surgery. I am taking the papers to them today.   Yesterday it was 7/10 while  working  Are you having pain? Yes: NPRS scale: 4/10 and at worst 8/10 Pain location: R knee Pain description: sharp Aggravating factors: turn while sleeping.  Steps, pushing accelerator on car, difficulty squatting, more difficulty putting on pants.  Relieving factors: nothing relieves pain I have trouble climbing a ladder to repaint bathroom,  mowing the lawn going up incline I cant' get on ground to play with grandchildren due to pain     OBJECTIVE: (objective measures completed at initial evaluation unless otherwise dated)   DIAGNOSTIC FINDINGS: IMPRESSION:10-03-20 Degenerative changes with small joint effusion. No acute bony abnormality.     PATIENT SURVEYS:  FOTO 60% predicted 67%   COGNITION:           Overall cognitive status: Within functional limits for tasks assessed                          SENSATION: WFL   EDEMA:  Circumferential: R 40.5 cm, L 39.0 cm   MUSCLE LENGTH: Hamstrings: Right 62 deg; Left 74 deg     POSTURE: rounded shoulders, forward head, and obesity   PALPATION: TTP over joint lines medial more than lateral but global pain   LOWER EXTREMITY ROM:   A/P ROM Right eval Left eval Right 12/18/21  Hip flexion       Hip extension       Hip abduction  Hip adduction       Hip internal rotation       Hip external rotation       Knee flexion 125/P 129 130/P 136 125  Knee extension -3 from ext 0 -3  Ankle dorsiflexion       Ankle plantarflexion       Ankle inversion       Ankle eversion        (Blank rows = not tested)   LOWER EXTREMITY MMT:   MMT Right eval Left eval  Hip flexion 4+ 5  Hip extension 4 4+  Hip abduction 4 4=  Hip adduction      Hip internal rotation      Hip external rotation      Knee flexion 4+ 5  Knee extension 4 5  Ankle dorsiflexion      Ankle plantarflexion      Ankle inversion      Ankle eversion       (Blank rows = not tested)     FUNCTIONAL TESTS:  5 times sit to stand: 20.43 sec    13 sec or  less no risk of fall 6 minute walk test: 12/08/21: 1496 feet   GAIT: Distance walked: 200 Assistive device utilized: None Level of assistance: Complete Independence Comments: Pt wt bears to left to unload R LE       TODAY'S TREATMENT: [facility] Adult PT Treatment:                                                DATE: 12/18/21 Therapeutic Exercise: Nustep L5 LE only x 7 min LAQ 4# 10 x 2 LT Blue hamstring curls 10 x 2  Hip flexor stretch with intermittent knee flexion QS into towel  SLR  Bridge Right hamstring stretch with strap  Right hip abduction  STS added 10#     [facility] Adult PT Treatment:                                                DATE: 12-06-21 [facility] Adult PT Treatment:                                                DATE: 12-06-21 Therapeutic Exercise: Nustep  6 min with UE/LE L5 6 MWT 1496 Feet Long Sitting Quad Set with Towel Roll Under Heel  - 1 Supine Active Straight Leg Raise  -  - 2 sets - 10 reps Supine Bridge - 2 sets - 10 reps  Supine Heel Slide with Strap  - 3 sets - 10 reps Sidelying Hip Abduction  - - 3 sets - 10 reps  Long Arc Quad  - 1 x daily - 7 x weekly - 3 sets - 10 reps   Supine Hamstring Stretch with Strap   - 1 sets - 3 reps - 30-60-sec hold Sit to stand with sink support Movement snack  - - 1 sets - 10 reps   Therapeutic Exercise: Nustep  6 min with UE/LE RPE 3/4 Long Sitting Quad Set with Towel Roll Under Heel  -  1 x daily - 7 x weekly - 3 sets - 10 reps  Supine Hamstring Stretch with Strap  - 1 x daily - 7 x weekly - 1 sets - 3 reps - 30-60-sec hold Supine Active Straight Leg Raise  - 1 x daily - 7 x weekly - 3 sets - 10 reps Supine Bridge with Mini Swiss Ball Between Knees  - 1 x daily - 7 x weekly - 3 sets - 10 reps Supine Short Arc Quad  - 1 x daily - 7 x weekly - 3 sets - 10 reps  Supine Heel Slide with Strap  - 1 x daily - 7 x weekly - 3 sets - 10 reps Sidelying Hip Abduction  - 1 x daily - 7 x weekly - 3 sets - 10 reps  Long Arc Quad  - 1  x daily - 7 x weekly - 3 sets - 10 reps Sit to stand with sink support Movement snack  - 3 x daily - 7 x weekly - 1 sets - 10 reps     Self Care: Explanation of TKE with Medbridge education and PT clarification     EVal and issue HEP     PATIENT EDUCATION:  Education details: POC  Explanation of findings  initial HEP Person educated: Patient Education method: Explanation, Demonstration, Tactile cues, Verbal cues, and Handouts Education comprehension: verbalized understanding, returned demonstration, verbal cues required, tactile cues required, and needs further education     HOME EXERCISE PROGRAM: Access Code: [email or web address] URL: [email or web address] Date: 12/05/2021 Prepared by: [place] Sitting Quad Set with Towel Roll Under Heel  - 1 x daily - 7 x weekly - 3 sets - 10 reps - Supine Hamstring Stretch with Strap  - 1 x daily - 7 x weekly - 1 sets - 3 reps - 30-60-sec hold - Supine Active Straight Leg Raise  - 1 x daily - 7 x weekly - 3 sets - 10 reps - Supine Short Arc Quad  - 1 x daily - 7 x weekly - 3 sets - 10 reps - Supine Heel Slide with Strap  - 1 x daily - 7 x weekly - 3 sets - 10 reps - Sidelying Hip Abduction  - 1 x daily - 7 x weekly - 3 sets - 10 reps - Long Arc Quad  - 1 x daily - 7 x weekly - 3 sets - 10 reps - Sit to stand with sink support Movement snack  - 3 x daily - 7 x weekly - 1 sets - 10 reps - Mini Squat  - 1 x daily - 7 x weekly - 3 sets - 10 reps - Supine Bridge with Mini Swiss Ball Between Knees  - 1 x daily - 7 x weekly - 3 sets - 10 reps   Patient Education - TKR: Surgery Overview   ASSESSMENT:   CLINICAL IMPRESSION:  Pt reports she is still working on getting her TKA surgery date scheduled. She reports performing stretches at work and not much strengthening. Her ROM has not changed. Her pain level remains low, around 4/10 unless she is working which requires standing for 12 hours shifts and pain reaches 7-8/10.    Pt  will benefit from skilled PT to increase better QOL before pursuing possible more invasive surgery if pain is not decreased.       OBJECTIVE IMPAIRMENTS decreased activity tolerance, decreased mobility, difficulty walking, decreased ROM, decreased strength, impaired flexibility, and pain.  ACTIVITY LIMITATIONS standing, squatting, sleeping, dressing, and locomotion level   PARTICIPATION LIMITATIONS: driving, occupation, and yard work   PERSONAL FACTORS Asthma, Hx of umbilical hernia, renal stones, HTN, Hx of tricompartmental Knee OA, Graves dz, prediabetes are also affecting patient's functional outcome.    REHAB POTENTIAL: Good   CLINICAL DECISION MAKING: Stable/uncomplicated   EVALUATION COMPLEXITY: Low     GOALS: Goals reviewed with patient? Yes   SHORT TERM GOALS: Target date: 12/28/2021  Independent with initial HEP Baseline: Goal status: ONGOING   2.  Pt will be able to perform 5 x STS in 13 seconds or less Baseline: Eval 20.30 sec due pain Goal status: ONGOING   3.  Pt will perform 6 MWT in age appropriate range Baseline: 1496 on 3rd visit Goal status: ONGOING   4.  Pt will be able to perform proper squat with hip flexion/knee flex 90 degrees Baseline: unable to squat with greater than 60 degrees knee flex on R due to pain and wt shift to Left Goal status: ONGOING       LONG TERM GOALS: Target date: 01/25/2022    Independent with advanced HEP Baseline: no knowledge Goal status: INITIAL   2.  Pt will be able to negotiate steps with 2/10 or less pain in R knee Baseline: eval pt 8/10 on steps  Goal status: INITIAL   3.  FOTO will improve from  60%  to   67%  indicating improved functional mobility  Baseline: eval 60% Goal status: INITIAL   4.  Pt will be able to  sleep at night at least 5 hours uninterrupted for restorative sleep Baseline: Pt now wakes up 3 times a night due to turning in bed and pain Goal status: INITIAL   5.  Pt will improve R  knee extensor/flexor strength to >/= 4+/5 with </= 2/10 pain to promote safety with walking/standing activities Baseline: See flow chart  4-/5 knee ext R Goal status: INITIAL   6.  Pt will be able to demonstrate getting up from ground in order to play with grandchildren with fear of falling Baseline: Pt avoids transfers to the ground due to pain and fear of not getting up from the ground Goal status: INITIAL     PLAN: PT FREQUENCY: 1-2x/week   PT DURATION: 8 weeks   PLANNED INTERVENTIONS: Therapeutic exercises, Therapeutic activity, Neuromuscular re-education, Balance training, Gait training, Patient/Family education, Self Care, Joint mobilization, Stair training, Dry Needling, Electrical stimulation, Cryotherapy, Moist heat, Taping, Ionotophoresis '4mg'$ /ml Dexamethasone, Manual therapy, and Re-evaluation   PLAN FOR NEXT SESSION: Review HEP , use KB with sit to stand, check stairs       Hessie Diener, PTA 12/18/21 11:44 AM Phone: [phone] Fax: [phone]

## 2021-12-18 NOTE — Telephone Encounter (Signed)
Patient notified that surgical clearance was faxed to [place] on 12/11/21.  Patient found Hydrocodone at [facility]. Please resend Rx. 2 Rxs sent 11/01/21 have been cancelled with [facility].

## 2021-12-22 ENCOUNTER — Encounter: Payer: Self-pay | Admitting: Physical Therapy

## 2021-12-22 ENCOUNTER — Ambulatory Visit: Payer: BC Managed Care – PPO | Admitting: Physical Therapy

## 2021-12-22 DIAGNOSIS — R262 Difficulty in walking, not elsewhere classified: Secondary | ICD-10-CM

## 2021-12-22 DIAGNOSIS — M6281 Muscle weakness (generalized): Secondary | ICD-10-CM

## 2021-12-22 DIAGNOSIS — G8929 Other chronic pain: Secondary | ICD-10-CM

## 2021-12-22 DIAGNOSIS — M25561 Pain in right knee: Secondary | ICD-10-CM | POA: Diagnosis not present

## 2021-12-22 NOTE — Therapy (Signed)
OUTPATIENT PHYSICAL THERAPY TREATMENT NOTE   Patient Name: Christy Burke MRN: [id number] DOB:[birth year], 61 y.o., female Today's Date: 12/22/2021  PCP: Velna Ochs, MD REFERRING PROVIDER: Velna Ochs, MD  END OF SESSION:   PT End of Session - 12/22/21 0936     Visit Number 4    Number of Visits 16    Date for PT Re-Evaluation 01/25/22    Authorization Type [employer] BLUE [employer]/BCBS COMM PPO    PT Start Time 0932    PT Stop Time 4098    PT Time Calculation (min) 43 min             Past Medical History:  Diagnosis Date   Asthma    History of umbilical hernia    Renal stones    Past Surgical History:  Procedure Laterality Date   lithotrypsy     UMBILICAL HERNIA REPAIR  1990   Patient Active Problem List   Diagnosis Date Noted   Chronic, continuous use of opioids 11/01/2021   Osteoarthritis of right knee 11/30/2020   Need for shingles vaccine 08/24/2020   Plantar fasciitis of right foot 08/26/2019   Paresthesia of arm 11/05/2018   Obesity (BMI 30.0-34.9) 11/05/2018   HTN (hypertension) 11/05/2018   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   Graves disease 04/15/2017   History of latent tuberculosis 09/25/2016   Encounter for screening for HIV 09/29/2015   Pre-diabetes 09/29/2015   Status post TAH-BSO 09/21/2015   Tendonitis of wrist, left 12/11/2014   Preventative health care 09/07/2014   Back pain 06/04/2013   Encounter for smoking cessation counseling 05/23/2012    REFERRING DIAG: M17.11 (ICD-10-CM) - Primary osteoarthritis of right knee   THERAPY DIAG:  Chronic pain of right knee  Muscle weakness (generalized)  Difficulty in walking, not elsewhere classified  Rationale for Evaluation and Treatment Rehabilitation  PERTINENT HISTORY:  Asthma, Hx of umbilical hernia, renal stones, HTN, Hx of tricompartmental Knee OA, Graves dz, prediabetes   PRECAUTIONS: none   SUBJECTIVE: Still waiting on the date for surgery. I am taking the papers to  them today.   Yesterday it was 7/10 while working  Are you having pain? Yes: NPRS scale: 4/10 and at worst 8/10 Pain location: R knee Pain description: sharp Aggravating factors: turn while sleeping.  Steps, pushing accelerator on car, difficulty squatting, more difficulty putting on pants.  Relieving factors: nothing relieves pain I have trouble climbing a ladder to repaint bathroom,  mowing the lawn going up incline I cant' get on ground to play with grandchildren due to pain     OBJECTIVE: (objective measures completed at initial evaluation unless otherwise dated)   DIAGNOSTIC FINDINGS: IMPRESSION:10-03-20 Degenerative changes with small joint effusion. No acute bony abnormality.     PATIENT SURVEYS:  FOTO 60% predicted 67%   COGNITION:           Overall cognitive status: Within functional limits for tasks assessed                          SENSATION: WFL   EDEMA:  Circumferential: R 40.5 cm, L 39.0 cm   MUSCLE LENGTH: Hamstrings: Right 62 deg; Left 74 deg     POSTURE: rounded shoulders, forward head, and obesity   PALPATION: TTP over joint lines medial more than lateral but global pain   LOWER EXTREMITY ROM:   A/P ROM Right eval Left eval Right 12/18/21  Hip flexion       Hip extension  Hip abduction       Hip adduction       Hip internal rotation       Hip external rotation       Knee flexion 125/P 129 130/P 136 125  Knee extension -3 from ext 0 -3  Ankle dorsiflexion       Ankle plantarflexion       Ankle inversion       Ankle eversion        (Blank rows = not tested)   LOWER EXTREMITY MMT:   MMT Right eval Left eval  Hip flexion 4+ 5  Hip extension 4 4+  Hip abduction 4 4=  Hip adduction      Hip internal rotation      Hip external rotation      Knee flexion 4+ 5  Knee extension 4 5  Ankle dorsiflexion      Ankle plantarflexion      Ankle inversion      Ankle eversion       (Blank rows = not tested)     FUNCTIONAL TESTS:  5  times sit to stand: 20.43 sec    13 sec or less no risk of fall 6 minute walk test: 12/08/21: 1496 feet   GAIT: Distance walked: 200 Assistive device utilized: None Level of assistance: Complete Independence Comments: Pt wt bears to left to unload R LE       TODAY'S TREATMENT:  [facility] Adult PT Treatment:                                                DATE: 12/22/21 Therapeutic Exercise: Nustep L5 LE only x 7 min Stair negotiation SLS 13 L, 10 sec R  Sink Squat for ROM x 10  LAQ 5# 10 x 2 RT Blue hamstring curls 10 x 1 Prone H/S curl 5# x 20 Hip flexor stretch with intermittent knee flexion using yoga strap Right hamstring stretch with strap  Right hip abduction  SLR  STS  10# 10 x 2   [facility] Adult PT Treatment:                                                DATE: 12/18/21 Therapeutic Exercise: Nustep L5 LE only x 7 min LAQ 4# 10 x 2 RT Blue hamstring curls 10 x 2  Hip flexor stretch with intermittent knee flexion QS into towel  SLR  Bridge Right hamstring stretch with strap  Right hip abduction  STS added 10#     [facility] Adult PT Treatment:                                                DATE: 12-06-21 [facility] Adult PT Treatment:                                                DATE: 12-06-21 Therapeutic Exercise: Nustep  6 min with UE/LE L5 6 MWT 1496  Feet Long Sitting Quad Set with Towel Roll Under Heel  - 1 Supine Active Straight Leg Raise  -  - 2 sets - 10 reps Supine Bridge - 2 sets - 10 reps  Supine Heel Slide with Strap  - 3 sets - 10 reps Sidelying Hip Abduction  - - 3 sets - 10 reps  Long Arc Quad  - 1 x daily - 7 x weekly - 3 sets - 10 reps   Supine Hamstring Stretch with Strap   - 1 sets - 3 reps - 30-60-sec hold Sit to stand with sink support Movement snack  - - 1 sets - 10 reps   Therapeutic Exercise: Nustep  6 min with UE/LE RPE 3/4 Long Sitting Quad Set with Towel Roll Under Heel  - 1 x daily - 7 x weekly - 3 sets - 10 reps  Supine Hamstring Stretch with  Strap  - 1 x daily - 7 x weekly - 1 sets - 3 reps - 30-60-sec hold Supine Active Straight Leg Raise  - 1 x daily - 7 x weekly - 3 sets - 10 reps Supine Bridge with Mini Swiss Ball Between Knees  - 1 x daily - 7 x weekly - 3 sets - 10 reps Supine Short Arc Quad  - 1 x daily - 7 x weekly - 3 sets - 10 reps  Supine Heel Slide with Strap  - 1 x daily - 7 x weekly - 3 sets - 10 reps Sidelying Hip Abduction  - 1 x daily - 7 x weekly - 3 sets - 10 reps  Long Arc Quad  - 1 x daily - 7 x weekly - 3 sets - 10 reps Sit to stand with sink support Movement snack  - 3 x daily - 7 x weekly - 1 sets - 10 reps     Self Care: Explanation of TKE with Medbridge education and PT clarification     EVal and issue HEP     PATIENT EDUCATION:  Education details: POC  Explanation of findings  initial HEP Person educated: Patient Education method: Explanation, Demonstration, Tactile cues, Verbal cues, and Handouts Education comprehension: verbalized understanding, returned demonstration, verbal cues required, tactile cues required, and needs further education     HOME EXERCISE PROGRAM: Access Code: [email or web address] URL: [email or web address] Date: 12/05/2021 Prepared by: [place] Sitting Quad Set with Towel Roll Under Heel  - 1 x daily - 7 x weekly - 3 sets - 10 reps - Supine Hamstring Stretch with Strap  - 1 x daily - 7 x weekly - 1 sets - 3 reps - 30-60-sec hold - Supine Active Straight Leg Raise  - 1 x daily - 7 x weekly - 3 sets - 10 reps - Supine Short Arc Quad  - 1 x daily - 7 x weekly - 3 sets - 10 reps - Supine Heel Slide with Strap  - 1 x daily - 7 x weekly - 3 sets - 10 reps - Sidelying Hip Abduction  - 1 x daily - 7 x weekly - 3 sets - 10 reps - Long Arc Quad  - 1 x daily - 7 x weekly - 3 sets - 10 reps - Sit to stand with sink support Movement snack  - 3 x daily - 7 x weekly - 1 sets - 10 reps - Mini Squat  - 1 x daily - 7 x weekly - 3 sets - 10 reps - Supine  Bridge with [employer] Between Knees  - 1 x daily - 7 x weekly - 3 sets - 10 reps   Patient Education - TKR: Surgery Overview   ASSESSMENT:   CLINICAL IMPRESSION:  Pt reports she is still working on getting her TKA surgery date scheduled. She reports that she has ankle weights and will find them today. She is still not performing prescribed exercises and makes up her own exercises in bed and at work. Performed stair negotiation with 1 Hr and alternating pattern safely with min increase in knee pain.   Pt  will benefit from skilled PT to increase better QOL before pursuing possible more invasive surgery if pain is not decreased.       OBJECTIVE IMPAIRMENTS decreased activity tolerance, decreased mobility, difficulty walking, decreased ROM, decreased strength, impaired flexibility, and pain.    ACTIVITY LIMITATIONS standing, squatting, sleeping, dressing, and locomotion level   PARTICIPATION LIMITATIONS: driving, occupation, and yard work   PERSONAL FACTORS Asthma, Hx of umbilical hernia, renal stones, HTN, Hx of tricompartmental Knee OA, Graves dz, prediabetes are also affecting patient's functional outcome.    REHAB POTENTIAL: Good   CLINICAL DECISION MAKING: Stable/uncomplicated   EVALUATION COMPLEXITY: Low     GOALS: Goals reviewed with patient? Yes   SHORT TERM GOALS: Target date: 12/28/2021  Independent with initial HEP Baseline: Goal status: ONGOING   2.  Pt will be able to perform 5 x STS in 13 seconds or less Baseline: Eval 20.30 sec due pain Goal status: ONGOING   3.  Pt will perform 6 MWT in age appropriate range Baseline: 1496 on 3rd visit Goal status: ONGOING   4.  Pt will be able to perform proper squat with hip flexion/knee flex 90 degrees Baseline: unable to squat with greater than 60 degrees knee flex on R due to pain and wt shift to Left Goal status: ONGOING       LONG TERM GOALS: Target date: 01/25/2022    Independent with advanced  HEP Baseline: no knowledge Goal status: INITIAL   2.  Pt will be able to negotiate steps with 2/10 or less pain in R knee Baseline: eval pt 8/10 on steps  Goal status: INITIAL   3.  FOTO will improve from  60%  to   67%  indicating improved functional mobility  Baseline: eval 60% Goal status: INITIAL   4.  Pt will be able to  sleep at night at least 5 hours uninterrupted for restorative sleep Baseline: Pt now wakes up 3 times a night due to turning in bed and pain Goal status: INITIAL   5.  Pt will improve R knee extensor/flexor strength to >/= 4+/5 with </= 2/10 pain to promote safety with walking/standing activities Baseline: See flow chart  4-/5 knee ext R Goal status: INITIAL   6.  Pt will be able to demonstrate getting up from ground in order to play with grandchildren with fear of falling Baseline: Pt avoids transfers to the ground due to pain and fear of not getting up from the ground Goal status: INITIAL     PLAN: PT FREQUENCY: 1-2x/week   PT DURATION: 8 weeks   PLANNED INTERVENTIONS: Therapeutic exercises, Therapeutic activity, Neuromuscular re-education, Balance training, Gait training, Patient/Family education, Self Care, Joint mobilization, Stair training, Dry Needling, Electrical stimulation, Cryotherapy, Moist heat, Taping, Ionotophoresis '4mg'$ /ml Dexamethasone, Manual therapy, and Re-evaluation   PLAN FOR NEXT SESSION: Review HEP , use KB with sit to stand, stair strength, CHECK STGs  Hessie Diener, PTA 12/22/21 9:48 AM Phone: [phone] Fax: [phone]

## 2021-12-27 ENCOUNTER — Ambulatory Visit: Payer: BC Managed Care – PPO | Admitting: Physical Therapy

## 2021-12-27 NOTE — Therapy (Addendum)
OUTPATIENT PHYSICAL THERAPY TREATMENT NOTE/DISCHARGE NOTE PHYSICAL THERAPY DISCHARGE SUMMARY  Visits from Start of Care: 5  Current functional level related to goals / functional outcomes: unknown   Remaining deficits: unknown   Education / Equipment: HEP   Patient agrees to discharge. Patient goals were partially met. Patient is being discharged due to not returning since the last visit.    Patient Name: Christy Burke MRN: [id number] DOB:07/06/1960, 61 y.o., female Today's Date: 12/28/2021  PCP: Velna Ochs, MD REFERRING PROVIDER: Velna Ochs, MD  END OF SESSION:   PT End of Session - 12/28/21 0935     Visit Number 5    Number of Visits 16    Date for PT Re-Evaluation 01/25/22    Authorization Type [employer] CR[employer] BLUE SHIELD/BCBS COMM PPO    PT Start Time 0933    PT Stop Time 1000    PT Time Calculation (min) 27 min    Activity Tolerance Patient tolerated treatment well    Behavior During Therapy WFL for tasks assessed/performed              Past Medical History:  Diagnosis Date   Asthma    History of umbilical hernia    Renal stones    Past Surgical History:  Procedure Laterality Date   lithotrypsy     UMBILICAL HERNIA REPAIR  1990   Patient Active Problem List   Diagnosis Date Noted   Chronic, continuous use of opioids 11/01/2021   Osteoarthritis of right knee 11/30/2020   Need for shingles vaccine 08/24/2020   Plantar fasciitis of right foot 08/26/2019   Paresthesia of arm 11/05/2018   Obesity (BMI 30.0-34.9) 11/05/2018   HTN (hypertension) 11/05/2018   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   Graves disease 04/15/2017   History of latent tuberculosis 09/25/2016   Encounter for screening for HIV 09/29/2015   Pre-diabetes 09/29/2015   Status post TAH-BSO 09/21/2015   Tendonitis of wrist, left 12/11/2014   Preventative health care 09/07/2014   Back pain 06/04/2013   Encounter for smoking cessation counseling 05/23/2012    REFERRING  DIAG: M17.11 (ICD-10-CM) - Primary osteoarthritis of right knee   THERAPY DIAG:  Chronic pain of right knee  Muscle weakness (generalized)  Difficulty in walking, not elsewhere classified  Rationale for Evaluation and Treatment Rehabilitation  PERTINENT HISTORY:  Asthma, Hx of umbilical hernia, renal stones, HTN, Hx of tricompartmental Knee OA, Graves dz, prediabetes   PRECAUTIONS: none   SUBJECTIVE:  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  I was not moving around and just standing in one spot.  My knee really hurt. I cant wait until they schedule the surgery  it was waking me in the middle of the night. I am wearing my brace today. Trying to do exercises every day at work standing.  I am doing my back exercise every night. MD calls at end of session to schedule R TKA for 01-12-22  12-28-21   7/10 pain now at initiation of PT  Are you having pain? Yes: NPRS scale: 4/10 and at worst 8/10 Pain location: R knee Pain description: sharp Aggravating factors: turn while sleeping.  Steps, pushing accelerator on car, difficulty squatting, more difficulty putting on pants.  Relieving factors: nothing relieves pain I have trouble climbing a ladder to repaint bathroom,  mowing the lawn going up incline I cant' get on ground to play with grandchildren due to pain     OBJECTIVE: (objective measures  completed at initial evaluation unless otherwise dated)   DIAGNOSTIC FINDINGS: IMPRESSION:10-03-20 Degenerative changes with small joint effusion. No acute bony abnormality.     PATIENT SURVEYS:  FOTO 60% predicted 67% 12-28-21 56%  predicted 67%   COGNITION:           Overall cognitive status: Within functional limits for tasks assessed                          SENSATION: WFL   EDEMA:  Circumferential: R 40.5 cm, L 39.0 cm   MUSCLE LENGTH: Hamstrings: Right 62 deg; Left 74 deg     POSTURE: rounded shoulders, forward head, and obesity    PALPATION: TTP over joint lines medial more than lateral but global pain   LOWER EXTREMITY ROM:   A/P ROM Right eval Left eval Right 12/18/21  Hip flexion       Hip extension       Hip abduction       Hip adduction       Hip internal rotation       Hip external rotation       Knee flexion 125/P 129 130/P 136 125  Knee extension -3 from ext 0 -3  Ankle dorsiflexion       Ankle plantarflexion       Ankle inversion       Ankle eversion        (Blank rows = not tested)   LOWER EXTREMITY MMT:   MMT Right eval Left eval  Hip flexion 4+ 5  Hip extension 4 4+  Hip abduction 4 4=  Hip adduction      Hip internal rotation      Hip external rotation      Knee flexion 4+ 5  Knee extension 4 5  Ankle dorsiflexion      Ankle plantarflexion      Ankle inversion      Ankle eversion       (Blank rows = not tested)     FUNCTIONAL TESTS:  5 times sit to stand: 20.43 sec    13 sec or less no risk of fall 12-28-21  16.37sec 1st attempt to warm up and then 11.63 sec 6 minute walk test: 12/08/21: 1496 feet   GAIT: Distance walked: 200 Assistive device utilized: None Level of assistance: Complete Independence Comments: Pt wt bears to left to unload R LE       TODAY'S TREATMENT: [facility] Adult PT Treatment:                                                DATE: 12-28-21 Therapeutic Exercise: Recumbent Bike 7 min Step ups 3 x 10 with 25 # Review HEP for upcoming surgery with handout Long Sitting Quad Set with Towel Roll Under Heel   1 x 10 Supine Hamstring Stretch with Strap 3 x 30 sec hold R Supine Active Straight Leg Raise  2 x 10 reps Supine Short Arc Quad  1 x 10 Supine Heel Slide with Strap 1 x10 Sidelying Hip Abduction   R 1 x 10 with VC Long Arc Quad  1 x 10 SELF CARE              Educated on TKR post RX with ice  and importance of movement with home care  [place] Adult PT Treatment:                                                DATE: 12/22/21 Therapeutic  Exercise: Nustep L5 LE only x 7 min Stair negotiation SLS 13 L, 10 sec R  Sink Squat for ROM x 10  LAQ 5# 10 x 2 RT Blue hamstring curls 10 x 1 Prone H/S curl 5# x 20 Hip flexor stretch with intermittent knee flexion using yoga strap Right hamstring stretch with strap  Right hip abduction  SLR  STS  10# 10 x 2   [facility] Adult PT Treatment:                                                DATE: 12/18/21 Therapeutic Exercise: Nustep L5 LE only x 7 min LAQ 4# 10 x 2 RT Blue hamstring curls 10 x 2  Hip flexor stretch with intermittent knee flexion QS into towel  SLR  Bridge Right hamstring stretch with strap  Right hip abduction  STS added 10#     [place] Adult PT Treatment:                                                DATE: 12-06-21 [place] Adult PT Treatment:                                                DATE: 12-06-21 Therapeutic Exercise: Nustep  6 min with UE/LE L5 6 MWT 1496 Feet Long Sitting Quad Set with Towel Roll Under Heel  - 1 Supine Active Straight Leg Raise  -  - 2 sets - 10 reps Supine Bridge - 2 sets - 10 reps  Supine Heel Slide with Strap  - 3 sets - 10 reps Sidelying Hip Abduction  - - 3 sets - 10 reps  Long Arc Quad  - 1 x daily - 7 x weekly - 3 sets - 10 reps   Supine Hamstring Stretch with Strap   - 1 sets - 3 reps - 30-60-sec hold Sit to stand with sink support Movement snack  - - 1 sets - 10 reps   Therapeutic Exercise: Nustep  6 min with UE/LE RPE 3/4 Long Sitting Quad Set with Towel Roll Under Heel  - 1 x daily - 7 x weekly - 3 sets - 10 reps  Supine Hamstring Stretch with Strap  - 1 x daily - 7 x weekly - 1 sets - 3 reps - 30-60-sec hold Supine Active Straight Leg Raise  - 1 x daily - 7 x weekly - 3 sets - 10 reps Supine Bridge with Mini Swiss Ball Between Knees  - 1 x daily - 7 x weekly - 3 sets - 10 reps Supine Short Arc Quad  - 1 x daily - 7 x weekly - 3 sets - 10 reps  Supine Heel Slide with Strap  - 1 x daily - 7 x weekly -  3 sets - 10  reps Sidelying Hip Abduction  - 1 x daily - 7 x weekly - 3 sets - 10 reps  Long Arc Quad  - 1 x daily - 7 x weekly - 3 sets - 10 reps Sit to stand with sink support Movement snack  - 3 x daily - 7 x weekly - 1 sets - 10 reps     Self Care: Explanation of TKE with Medbridge education and PT clarification     EVal and issue HEP     PATIENT EDUCATION:  Education details: POC  Explanation of findings  initial HEP Person educated: Patient Education method: Explanation, Demonstration, Tactile cues, Verbal cues, and Handouts Education comprehension: verbalized understanding, returned demonstration, verbal cues required, tactile cues required, and needs further education     HOME EXERCISE PROGRAM: Access Code: [email or web address] URL: [email or web address] Date: 12/05/2021 Prepared by: [place] Sitting Quad Set with Towel Roll Under Heel  - 1 x daily - 7 x weekly - 3 sets - 10 reps - Supine Hamstring Stretch with Strap  - 1 x daily - 7 x weekly - 1 sets - 3 reps - 30-60-sec hold - Supine Active Straight Leg Raise  - 1 x daily - 7 x weekly - 3 sets - 10 reps - Supine Short Arc Quad  - 1 x daily - 7 x weekly - 3 sets - 10 reps - Supine Heel Slide with Strap  - 1 x daily - 7 x weekly - 3 sets - 10 reps - Sidelying Hip Abduction  - 1 x daily - 7 x weekly - 3 sets - 10 reps - Long Arc Quad  - 1 x daily - 7 x weekly - 3 sets - 10 reps - Sit to stand with sink support Movement snack  - 3 x daily - 7 x weekly - 1 sets - 10 reps - Mini Squat  - 1 x daily - 7 x weekly - 3 sets - 10 reps - Supine Bridge with Mini Swiss Ball Between Knees  - 1 x daily - 7 x weekly - 3 sets - 10 reps   Patient Education - TKR: Surgery Overview   ASSESSMENT:   CLINICAL IMPRESSION:  Pt received call from MD while in PT at end of session.  RX 27 min to go over basic HEP for prehab for TKA.  R TKA scheduled for 01-12-22.  All appt on hold until after surgery where pt will need new  referral for RX.  FOTO decreased to 56% but  5 x STS improved to 11.63 sec with increased LE strength but pt wt bear to Left due to R LE pain at 7/10.  Pt  reviewed HEP for prehab exercises and educated on initial home care PT to expect.  Pt with 7/10 pain today and noted increased swelling while standing at work this past weekend.  All STG met or partially met except 6MWT STG#3 Pt will be on hold until after R TKA.  Pt  will benefit from skilled PT to increase better QOL.  Post R TKA surgery       OBJECTIVE IMPAIRMENTS decreased activity tolerance, decreased mobility, difficulty walking, decreased ROM, decreased strength, impaired flexibility, and pain.    ACTIVITY LIMITATIONS standing, squatting, sleeping, dressing, and locomotion level   PARTICIPATION LIMITATIONS: driving, occupation, and yard work   PERSONAL FACTORS Asthma, Hx of umbilical hernia, renal stones, HTN, Hx of tricompartmental Knee OA, Graves dz, prediabetes  are also affecting patient's functional outcome.    REHAB POTENTIAL: Good   CLINICAL DECISION MAKING: Stable/uncomplicated   EVALUATION COMPLEXITY: Low     GOALS: Goals reviewed with patient? Yes   SHORT TERM GOALS: Target date: 12/28/2021  Independent with initial HEP Baseline: Goal status:MET   2.  Pt will be able to perform 5 x STS in 13 seconds or less Baseline: Eval 20.30 sec due pain10-26-23  11.63 sec Goal status: MET   3.  Pt will perform 6 MWT in age appropriate range Baseline: 1496 on 3rd visit Goal status: ONGOING   4.  Pt will be able to perform proper squat with hip flexion/knee flex 90 degrees Baseline: unable to squat with greater than 60 degrees knee flex on R due to pain and wt shift to Left 12-28-21  squats to 80 degrees but bears wt to left Goal status: Partially met       LONG TERM GOALS: Target date: 01/25/2022    Independent with advanced HEP Baseline: no knowledge Goal status: INITIAL   2.  Pt will be able to negotiate steps  with 2/10 or less pain in R knee Baseline: eval pt 8/10 on steps  Goal status: INITIAL   3.  FOTO will improve from  60%  to   67%  indicating improved functional mobility  Baseline: eval 60% Goal status: INITIAL   4.  Pt will be able to  sleep at night at least 5 hours uninterrupted for restorative sleep Baseline: Pt now wakes up 3 times a night due to turning in bed and pain Goal status: INITIAL   5.  Pt will improve R knee extensor/flexor strength to >/= 4+/5 with </= 2/10 pain to promote safety with walking/standing activities Baseline: See flow chart  4-/5 knee ext R Goal status: INITIAL   6.  Pt will be able to demonstrate getting up from ground in order to play with grandchildren with fear of falling Baseline: Pt avoids transfers to the ground due to pain and fear of not getting up from the ground Goal status: INITIAL     PLAN: PT FREQUENCY: 1-2x/week   PT DURATION: 8 weeks   PLANNED INTERVENTIONS: Therapeutic exercises, Therapeutic activity, Neuromuscular re-education, Balance training, Gait training, Patient/Family education, Self Care, Joint mobilization, Stair training, Dry Needling, Electrical stimulation, Cryotherapy, Moist heat, Taping, Ionotophoresis 7m/ml Dexamethasone, Manual therapy, and Re-evaluation   PLAN FOR NEXT SESSION: Review HEP , use KB with sit to stand, stair strength, CHECK STGs     LVoncille Lo PT, ACamc Women And Children'S HospitalCertified Exercise Expert for the Aging Adult  12/28/21 10:30 AM Phone: [phone]: [phone]   LVoncille Lo PWilliamsport ASpringfieldCertified Exercise Expert for the Aging Adult  04/03/22 4:29 PM Phone: [phone]: [phone]

## 2021-12-28 ENCOUNTER — Ambulatory Visit: Payer: BC Managed Care – PPO | Admitting: Physical Therapy

## 2021-12-28 ENCOUNTER — Encounter: Payer: Self-pay | Admitting: Physical Therapy

## 2021-12-28 DIAGNOSIS — R262 Difficulty in walking, not elsewhere classified: Secondary | ICD-10-CM

## 2021-12-28 DIAGNOSIS — M25561 Pain in right knee: Secondary | ICD-10-CM | POA: Diagnosis not present

## 2021-12-28 DIAGNOSIS — M6281 Muscle weakness (generalized): Secondary | ICD-10-CM

## 2021-12-28 DIAGNOSIS — G8929 Other chronic pain: Secondary | ICD-10-CM

## 2021-12-29 NOTE — Telephone Encounter (Signed)
ERROR

## 2022-01-01 ENCOUNTER — Ambulatory Visit: Payer: BC Managed Care – PPO | Admitting: Physical Therapy

## 2022-01-01 ENCOUNTER — Telehealth: Payer: Self-pay | Admitting: *Deleted

## 2022-01-01 NOTE — Telephone Encounter (Signed)
Tell her thank you for letting me know. I am fine with her filling additional prescriptions from her surgeon, that is not a problem. Please let her know to call back if she needs anything from me.

## 2022-01-01 NOTE — Telephone Encounter (Signed)
Pt's calling to let her doctor know she's having knee surgery on Nov 10th. And her pain med (Hydrocodone) is not due until the 16th. And according to the pain contract - she informing Dr Philipp Ovens she may be prescribed pain med per the surgeon.

## 2022-01-01 NOTE — Telephone Encounter (Signed)
Pt called / informed of Dr Rivka Safer response.

## 2022-01-02 ENCOUNTER — Encounter: Payer: BC Managed Care – PPO | Admitting: Physical Therapy

## 2022-01-03 HISTORY — PX: TOTAL KNEE ARTHROPLASTY: SHX125

## 2022-01-04 NOTE — Telephone Encounter (Signed)
Thank you, - yes I will write another Rx when needed. Thanks.

## 2022-01-04 NOTE — Telephone Encounter (Signed)
Patient calling back states the Rx that surgeon will write for pain med will not last her till next time she is due. She is assured that PCP is aware of situation and she just needs to call back when refill is needed. States, "I want to follow all the rules." This RN expressed appreciation for keeping [place] updated.

## 2022-01-09 ENCOUNTER — Other Ambulatory Visit: Payer: Self-pay

## 2022-01-09 MED ORDER — HYDROCODONE-ACETAMINOPHEN 10-325 MG PO TABS: 1.0000 | ORAL_TABLET | Freq: Two times a day (BID) | ORAL | 0 refills | Status: DC | PRN

## 2022-01-09 NOTE — Telephone Encounter (Signed)
Last rx written 12/18/21. Last OV  11/01/21. Next OV 02/07/22. [place] 11/01/21.

## 2022-01-09 NOTE — Telephone Encounter (Signed)
HYDROcodone-acetaminophen (NORCO) 10-325 MG tablet, REFILL REQUEST.  

## 2022-01-23 ENCOUNTER — Other Ambulatory Visit (HOSPITAL_COMMUNITY): Payer: Self-pay | Admitting: Orthopedic Surgery

## 2022-01-23 DIAGNOSIS — Z96651 Presence of right artificial knee joint: Secondary | ICD-10-CM

## 2022-01-23 DIAGNOSIS — M79604 Pain in right leg: Secondary | ICD-10-CM

## 2022-01-24 ENCOUNTER — Ambulatory Visit (HOSPITAL_COMMUNITY)
Admission: RE | Admit: 2022-01-24 | Discharge: 2022-01-24 | Disposition: A | Discharge status: Home / Self Care | Payer: BC Managed Care – PPO | Source: Ambulatory Visit | Attending: Orthopedic Surgery | Admitting: Orthopedic Surgery

## 2022-01-24 DIAGNOSIS — Z96651 Presence of right artificial knee joint: Secondary | ICD-10-CM | POA: Insufficient documentation

## 2022-02-05 ENCOUNTER — Other Ambulatory Visit: Payer: Self-pay | Admitting: Internal Medicine

## 2022-02-05 MED ORDER — HYDROCODONE-ACETAMINOPHEN 10-325 MG PO TABS: 1.0000 | ORAL_TABLET | Freq: Two times a day (BID) | ORAL | 0 refills | Status: DC | PRN

## 2022-02-05 NOTE — Telephone Encounter (Signed)
HYDROcodone-acetaminophen (NORCO) 10-325 MG tablet    [id number] - [place] [address] [place] - [place]

## 2022-02-05 NOTE — Telephone Encounter (Signed)
Last Appointment 11/01/2021.  Last [facility] 11/01/2021.  Next appointment 02/07/2022.

## 2022-02-07 ENCOUNTER — Ambulatory Visit (INDEPENDENT_AMBULATORY_CARE_PROVIDER_SITE_OTHER): Payer: BC Managed Care – PPO | Admitting: Internal Medicine

## 2022-02-07 ENCOUNTER — Encounter: Payer: Self-pay | Admitting: Internal Medicine

## 2022-02-07 VITALS — BP 139/85 | HR 56 | Temp 98.1°F | Ht 66.0 in | Wt 196.8 lb

## 2022-02-07 DIAGNOSIS — Z96651 Presence of right artificial knee joint: Secondary | ICD-10-CM

## 2022-02-07 DIAGNOSIS — I10 Essential (primary) hypertension: Secondary | ICD-10-CM

## 2022-02-07 DIAGNOSIS — E0500 Thyrotoxicosis with diffuse goiter without thyrotoxic crisis or storm: Secondary | ICD-10-CM | POA: Diagnosis not present

## 2022-02-07 DIAGNOSIS — F17210 Nicotine dependence, cigarettes, uncomplicated: Secondary | ICD-10-CM | POA: Diagnosis not present

## 2022-02-07 DIAGNOSIS — Z716 Tobacco abuse counseling: Secondary | ICD-10-CM

## 2022-02-07 DIAGNOSIS — Z23 Encounter for immunization: Secondary | ICD-10-CM | POA: Diagnosis not present

## 2022-02-07 MED ORDER — HYDROCODONE-ACETAMINOPHEN 10-325 MG PO TABS: 1.0000 | ORAL_TABLET | Freq: Two times a day (BID) | ORAL | 0 refills | Status: DC | PRN

## 2022-02-07 MED ORDER — GABAPENTIN 300 MG PO CAPS: 300.0000 mg | ORAL_CAPSULE | Freq: Every day | ORAL | 2 refills | Status: DC

## 2022-02-07 MED ORDER — HYDROCHLOROTHIAZIDE 25 MG PO TABS: 25.0000 mg | ORAL_TABLET | Freq: Every day | ORAL | 3 refills | Status: DC

## 2022-02-07 NOTE — Assessment & Plan Note (Signed)
Asymptomatic, doing well on methimazole 5 mg qAM and 2.5 mg qPM. Has endocrinology follow up scheduled next month. Last TFTs were WNL.

## 2022-02-07 NOTE — Assessment & Plan Note (Signed)
Slightly above goal but previously well controlled on HCTZ 25 mg daily. Given acute knee pain will hold off on changes to her regimen. Will plan for labs at her next follow up, renal function has been stable.

## 2022-02-07 NOTE — Progress Notes (Signed)
Subjective:   Patient ID: Christy Burke female   DOB: 1960/06/06 61 y.o.   MRN: [id number]  HPI: Christy Burke is a 61 y.o. female with past medical history outlined below here for follow up of HTN and chronic pain. For the details of today's visit, please refer to the assessment and plan below.   Past Medical History:  Diagnosis Date   Asthma    History of umbilical hernia    Renal stones    Current Outpatient Medications  Medication Sig Dispense Refill   gabapentin (NEURONTIN) 300 MG capsule Take 1 capsule (300 mg total) by mouth at bedtime. 30 capsule 2   albuterol (VENTOLIN HFA) 108 (90 Base) MCG/ACT inhaler Inhale 2 puffs into the lungs every 6 (six) hours as needed for wheezing or shortness of breath. 1 Inhaler 0   Ascorbic Acid (VITAMIN C PO) Take 1 tablet by mouth daily. Reported on 06/13/2015 (Patient not taking: Reported on 11/30/2021)     buPROPion (WELLBUTRIN SR) 150 MG 12 hr tablet Take 1 tablet (150 mg total) by mouth 2 (two) times daily. 60 tablet 5   Diclofenac Sodium 3 % GEL Apply 2 g topically 2 (two) times daily. 100 g 0   hydrochlorothiazide (HYDRODIURIL) 25 MG tablet Take 1 tablet (25 mg total) by mouth daily. 90 tablet 3   HYDROcodone-acetaminophen (NORCO) 10-325 MG tablet Take 1 tablet by mouth 2 (two) times daily as needed. 60 tablet 0   methimazole (TAPAZOLE) 5 MG tablet TAKE 1 TABLET BY MOUTH IN THE MORNING AND 1/2 (ONE-HALF) IN THE EVENING 135 tablet 0   mupirocin ointment (BACTROBAN) 2 % Apply 1 Application topically 2 (two) times daily. Apply to affected area 22 g 0   naproxen (NAPROSYN) 500 MG tablet Take 1 tablet (500 mg total) by mouth 2 (two) times daily with a meal. 180 tablet 1   No current facility-administered medications for this visit.   Family History  Problem Relation Age of Onset   Breast cancer Mother    Cancer Mother    Diabetes Father    Colon cancer Neg Hx    Social History   Socioeconomic History   Marital status: Married     Spouse name: Not on file   Number of children: Not on file   Years of education: Not on file   Highest education level: Not on file  Occupational History   Not on file  Tobacco Use   Smoking status: Every Day    Packs/day: 0.50    Years: 39.00    Total pack years: 19.50    Types: Cigarettes   Smokeless tobacco: Never   Tobacco comments:    Stop vaping; started back smoking.  Substance and Sexual Activity   Alcohol use: No    Alcohol/week: 0.0 standard drinks of alcohol    Comment: Quit 9 yrs ago.   Drug use: No    Types: Cocaine    Comment: Quit several years ago, never did IVDU only smoked cocaine   Sexual activity: Yes    Birth control/protection: Other-see comments    Comment: In same sex relationship for 19 years  Other Topics Concern   Not on file  Social History Narrative   Not on file   Social Determinants of Health   Financial Resource Strain: Low Risk  (02/07/2022)   Overall Financial Resource Strain (CARDIA)    Difficulty of Paying Living Expenses: Not hard at all  Food Insecurity: No Food Insecurity (02/07/2022)   Hunger Vital  Sign    Worried About Charity fundraiser in the Last Year: Never true    [place] in the Last Year: Never true  Transportation Needs: No Transportation Needs (02/07/2022)   PRAPARE - Hydrologist (Medical): No    Lack of Transportation (Non-Medical): No  Physical Activity: Not on file  Stress: Not on file  Social Connections: Socially Integrated (02/07/2022)   Social Connection and Isolation Panel [NHANES]    Frequency of Communication with Friends and Family: More than three times a week    Frequency of Social Gatherings with Friends and Family: Twice a week    Attends Religious Services: 1 to 4 times per year    Active Member of [employer] or Organizations: No    Attends Music therapist: 1 to 4 times per year    Marital Status: Married    Objective:  Physical Exam:  Vitals:    02/07/22 0955 02/07/22 1040  BP: (!) 134/101 139/85  Pulse: 61 (!) 56  Temp: 98.1 F (36.7 C)   TempSrc: Oral   SpO2: 98%   Weight: 196 lb 12.8 oz (89.3 kg)   Height: '5\' 6"'$  (1.676 m)     Constitutional: NAD, appears well  Cardiovascular: RRR, no m/r/g Pulmonary/Chest: Clear bilaterally, normal effort Extremities: Right knee TKA incision c/d/I, moderate swelling, limited ROM due to pain, decreased sensation to light touch over lateral calf    Assessment & Plan:   History of total knee replacement, right Still recovering from right TKA approximately one month ago. Currently ambulating with a walker, participating in PT twice a week. Still having pain for which she is taking chronic norco BID. Also on a muscle relaxer per ortho. She is having some paresthesia distal to her right knee and focal pain and numbness over her lateral calf. I suspect she has a common peroneal nerve entrapment or compression. Knee is still moderately swollen, incision is well healed. I am hopeful the neuropathy will improve as the swelling subsides, however encouraged her to address with her surgeon at follow up if it continues to be bothersome. I reviewed the PDMP and sent 3 Rx refills of her chronic norco to her pharmacy. I have also provided her with low dose gabapentin 300 mg QHS for the neuropathy.   HTN (hypertension) Slightly above goal but previously well controlled on HCTZ 25 mg daily. Given acute knee pain will hold off on changes to her regimen. Will plan for labs at her next follow up, renal function has been stable.   Graves disease Asymptomatic, doing well on methimazole 5 mg qAM and 2.5 mg qPM. Has endocrinology follow up scheduled next month. Last TFTs were WNL.   Encounter for smoking cessation counseling Taking buproprion intermittently, still smoking. Encouraged cessation.   Needs flu shot Flu shot given today.   Follow up 6 months.

## 2022-02-07 NOTE — Patient Instructions (Signed)
Ms. Staiger,  It was a pleasure to see you. Please follow up with me again in 6 months. If you have any questions or concerns, call our clinic at [phone] or after hours call [phone] and ask for the internal medicine resident on call.   Thank you!  Dr. Darnell Level

## 2022-02-07 NOTE — Assessment & Plan Note (Addendum)
Still recovering from right TKA approximately one month ago. Currently ambulating with a walker, participating in PT twice a week. Still having pain for which she is taking chronic norco BID. Also on a muscle relaxer per ortho. She is having some paresthesia distal to her right knee and focal pain and numbness over her lateral calf. I suspect she has a common peroneal nerve entrapment or compression. Knee is still moderately swollen, incision is well healed. I am hopeful the neuropathy will improve as the swelling subsides, however encouraged her to address with her surgeon at follow up if it continues to be bothersome. I reviewed the PDMP and sent 3 Rx refills of her chronic norco to her pharmacy. I have also provided her with low dose gabapentin 300 mg QHS for the neuropathy.

## 2022-02-07 NOTE — Assessment & Plan Note (Signed)
Taking buproprion intermittently, still smoking. Encouraged cessation.

## 2022-02-07 NOTE — Assessment & Plan Note (Signed)
Flu shot given today

## 2022-03-07 ENCOUNTER — Other Ambulatory Visit: Payer: Self-pay

## 2022-03-08 NOTE — Telephone Encounter (Signed)
She should still have two Rx on file with her pharmacy. Thanks.

## 2022-03-12 ENCOUNTER — Other Ambulatory Visit: Payer: Self-pay | Admitting: *Deleted

## 2022-03-12 ENCOUNTER — Telehealth: Payer: Self-pay

## 2022-03-12 ENCOUNTER — Other Ambulatory Visit (HOSPITAL_COMMUNITY): Payer: Self-pay

## 2022-03-12 DIAGNOSIS — F1190 Opioid use, unspecified, uncomplicated: Secondary | ICD-10-CM

## 2022-03-12 MED ORDER — HYDROCODONE-ACETAMINOPHEN 10-325 MG PO TABS
1.0000 | ORAL_TABLET | Freq: Two times a day (BID) | ORAL | 0 refills | Status: DC | PRN
  Filled 2022-03-12: qty 60, 30d supply, fill #0

## 2022-03-12 NOTE — Telephone Encounter (Signed)
Pt states [facility] has hydrocodone. Pt would like the Rx sent to the pharmacy.

## 2022-03-14 ENCOUNTER — Ambulatory Visit
Admission: EM | Admit: 2022-03-14 | Discharge: 2022-03-14 | Disposition: A | Discharge status: Home / Self Care | Payer: BC Managed Care – PPO

## 2022-03-14 DIAGNOSIS — M25512 Pain in left shoulder: Secondary | ICD-10-CM

## 2022-03-14 HISTORY — DX: Essential (primary) hypertension: I10

## 2022-03-14 MED ORDER — CYCLOBENZAPRINE HCL 10 MG PO TABS: 10.0000 mg | ORAL_TABLET | Freq: Two times a day (BID) | ORAL | 0 refills | Status: DC | PRN

## 2022-03-14 MED ORDER — PREDNISONE 20 MG PO TABS: 40.0000 mg | ORAL_TABLET | Freq: Every day | ORAL | 0 refills | Status: AC

## 2022-03-14 NOTE — ED Triage Notes (Signed)
Pt c/o left shoulder pain that started last week while they were driving. Denies known trauma/injury.

## 2022-03-14 NOTE — ED Provider Notes (Signed)
[name]    CSN: [id number] Arrival date & time: 03/14/22  0830      History   Chief Complaint Chief Complaint  Patient presents with   left shoulder pain    HPI Christy Burke is a 62 y.o. female.   Patient here today for evaluation of left shoulder pain that has been ongoing for about a week. She states she first noticed pain while driving. She has had pain with movement of her left arm since and has had trouble getting comfortable at night. Movement makes pain worse. Pain seems to be worse at night as well. She has not had any numbness or tingling. She does have some pain that radiates from the base of her spine into her trapezius on left with certain movements of head. She is currently being treated for pain after right knee replacement 2 months ago.   The history is provided by the patient.    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      Home Medications    Prior to Admission medications    Medication Sig Start Date End Date Taking? Authorizing Provider  CELEBREX 200 MG capsule Take 200 mg by mouth 2 (two) times daily. 03/13/22  Yes [provider]  cyclobenzaprine (FLEXERIL) 10 MG tablet Take 1 tablet (10 mg total) by mouth 2 (two) times daily as needed for muscle spasms. 03/14/22  Yes Francene Finders, PA-C  predniSONE (DELTASONE) 20 MG tablet Take 2 tablets (40 mg total) by mouth daily with breakfast for 5 days. 03/14/22 03/19/22 Yes Francene Finders, PA-C  tiZANidine (ZANAFLEX) 2 MG tablet Take 4 mg by mouth 2 (two) times daily. 03/12/22  Yes [provider]  albuterol (VENTOLIN HFA) 108 (90 Base) MCG/ACT inhaler Inhale 2 puffs into the lungs every 6 (six) hours as needed for wheezing or shortness of breath. 07/02/18   Tasia Catchings, Amy V, PA-C  Diclofenac Sodium 3 % GEL Apply 2 g topically 2 (two) times daily. 10/03/20   Jaynee Eagles, PA-C  gabapentin (NEURONTIN) 300 MG capsule Take 1 capsule (300 mg total) by mouth at bedtime. 02/07/22   Velna Ochs, MD  hydrochlorothiazide (HYDRODIURIL) 25 MG tablet Take 1 tablet (25 mg total) by mouth daily. 02/07/22   Velna Ochs, MD  HYDROcodone-acetaminophen (NORCO) 10-325 MG tablet Take 1 tablet by mouth 2 (two) times daily as needed. 03/12/22   Velna Ochs, MD  methimazole (TAPAZOLE) 5 MG  tablet TAKE 1 TABLET BY MOUTH IN THE MORNING AND 1/2 (ONE-HALF) IN THE EVENING 10/27/21   Philemon Kingdom, MD    Family History Family History  Problem Relation Age of Onset   Breast cancer Mother    Cancer Mother    Diabetes Father    Colon cancer Neg Hx     Social History Social History   Tobacco Use   Smoking status: Every Day    Packs/day: 0.50    Years: 39.00    Total pack years: 19.50    Types: Cigarettes   Smokeless tobacco: Never   Tobacco comments:    Stop vaping; started back smoking.  Substance Use Topics   Alcohol use: No    Alcohol/week: 0.0 standard drinks of alcohol    Comment: Quit 9 yrs ago.   Drug use:  No    Types: Cocaine    Comment: Quit several years ago, never did IVDU only smoked cocaine     Allergies   Patient has no known allergies.   Review of Systems Review of Systems  Constitutional:  Negative for chills and fever.  Eyes:  Negative for discharge and redness.  Respiratory:  Negative for shortness of breath.   Cardiovascular:  Negative for chest pain.  Gastrointestinal:  Negative for abdominal pain, nausea and vomiting.  Musculoskeletal:  Positive for arthralgias and myalgias. Negative for joint swelling.  Neurological:  Negative for numbness.     Physical Exam Triage Vital Signs ED Triage Vitals  Enc Vitals Group     BP      Pulse      Resp      Temp      Temp src      SpO2      Weight      Height      Head Circumference      Peak Flow      Pain Score      Pain Loc      Pain Edu?      Excl. in [place]?    No data found.  Updated Vital Signs BP 118/86 (BP Location: Right Arm)   Pulse 74   Temp 98.4 F (36.9 C) (Oral)   Resp 16   LMP 03/14/2015 (Approximate)   SpO2 96%      Physical Exam Vitals and nursing note reviewed.  Constitutional:      General: She is not in acute distress.    Appearance: Normal appearance. She is not ill-appearing.  HENT:     Head: Normocephalic and atraumatic.  Eyes:     Conjunctiva/sclera: Conjunctivae normal.  Cardiovascular:     Rate and Rhythm: Normal rate.  Pulmonary:     Effort: Pulmonary effort is normal.  Musculoskeletal:     Comments: Mildly decreased ROM of left shoulder due to pain, Full ROM of neck, diffuse TTP to left trapezius, posterior shoulder, No TTP to anterior left shoulder, AC  Neurological:     Mental Status: She is alert.     Comments: Grip strength 5/5 bilaterally, gross sensation intact to distal left fingers  Psychiatric:        Mood and Affect: Mood normal.        Behavior: Behavior normal.        Thought Content: Thought content normal.      UC Treatments / Results  Labs (all  labs ordered are listed, but only abnormal results are displayed) Labs Reviewed - No data to display  EKG  Radiology No results found.  Procedures Procedures (including critical care time)  Medications Ordered in UC Medications - No data to display  Initial Impression / Assessment and Plan / UC Course  I have reviewed the triage vital signs and the nursing notes.  Pertinent labs & imaging results that were available during my care of the patient were reviewed by me and considered in my medical decision making (see chart for details).    Suspect muscle strain vs arthritis flare- will treat with steroid burst and muscle relaxer. Advised muscle relaxer may cause drowsiness and advised she use with caution. Encouraged follow up with ortho if no gradual improvement or here/ PCP office sooner with any further concerns.   Final Clinical Impressions(s) / UC Diagnoses   Final diagnoses:  Acute pain of left shoulder   Discharge Instructions   None    ED Prescriptions     Medication Sig Dispense Auth. Provider   predniSONE (DELTASONE) 20 MG tablet Take 2 tablets (40 mg total) by mouth daily with breakfast for 5 days. 10 tablet Ewell Poe F, PA-C   cyclobenzaprine (FLEXERIL) 10 MG tablet Take 1 tablet (10 mg total) by mouth 2 (two) times daily as needed for muscle spasms. 20 tablet Francene Finders, PA-C      PDMP not reviewed this encounter.   Francene Finders, PA-C 03/14/22 [id number]

## 2022-03-16 ENCOUNTER — Encounter: Payer: Self-pay | Admitting: Internal Medicine

## 2022-03-16 ENCOUNTER — Ambulatory Visit (INDEPENDENT_AMBULATORY_CARE_PROVIDER_SITE_OTHER): Payer: BC Managed Care – PPO | Admitting: Internal Medicine

## 2022-03-16 VITALS — BP 124/80 | HR 76 | Ht 66.0 in | Wt 198.4 lb

## 2022-03-16 DIAGNOSIS — E0500 Thyrotoxicosis with diffuse goiter without thyrotoxic crisis or storm: Secondary | ICD-10-CM

## 2022-03-16 MED ORDER — METHIMAZOLE 5 MG PO TABS: ORAL_TABLET | ORAL | 3 refills | Status: DC

## 2022-03-16 NOTE — Progress Notes (Addendum)
Patient ID: Christy Burke, female   DOB: [birth year], 62 y.o.   MRN: [id number]   HPI  Christy Burke is a 62 y.o.-year-old female, returning for follow-up for Graves' disease and Graves' ophthalmopathy.  Last visit 1 year ago.  Interim history: She has itchy eyes.  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  Wears eye protectors and mask. At last visit, she had pain in the right knee.  She had TKR 2 mo ago.  She still has some swelling and weakness and walks with a cane >> now on PT. Her job requires to stand 12h. She did not return to work yet, may need to return to work next month, but she does not feel that she is ready for this. She just started driving. She gained 5 lbs since last visit.  No tremors, palpitations, anxiety, insomnia. She has blurry vision. Last eye exam 1 year ago and plans to schedule another one as she feels she may need to change her glasses. She went to UC 2 days ago for L shoulder pain. She was given Prednisone for 5 days - had 3 doses.  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 Labs remained abnormal 2 weeks later.  At that time, she was started on: - Methimazole 10 mg 3x a day - Propranolol 10 mg 2x a day  Retrospectively, she did have: - hot flushes (but had a hysterectomy) - L eye bulging - some blurry vision, but not diplopia, chemosis, pain - weight loss: 20 lbs - in last 4 mo - lack of appetite - tremors - palpitations - better on beta blockers - no hyperdefecation - no insomnia - + anxiety  In 03/2017, she was 3 times a day and Inderal 10 mg twice a day.  The symptoms resolved, except for hot flashes, for which she is on HRT.  We subsequently decreased the dose of methimazole and stopped Inderal.  However, she was not usually returning for last between appointments despite advised to do so. In 07/2018, her TSH was elevated,  so I advised her to reduce the methimazole dose to 5 mg twice a day and come back for labs in 1.5 months.  She did not return...  In 03/2019, we decreased her methimazole dose further to 5 mg a.m. and 2.5 mg in p.m.  She did not return for labs... In 07/2019 she ran out of methimazole 5 days prior to the appointment... We restarted 5 mg in am and 2.5 mg later in the day. In 11/2019, we decreased methimazole to 5 mg daily.  Reviewed her TFTs: Lab Results  Component Value Date   TSH 1.33 03/17/2021   TSH 1.31 09/12/2020   TSH 2.73 03/18/2020   TSH 2.35 11/13/2019   TSH 0.91 07/10/2019   TSH 2.66 03/11/2019   TSH 1.32 11/05/2018   TSH 11.77 (H) 07/16/2018   TSH 0.59 11/25/2017   TSH <0.01 Repeated and verified X2. (L) 07/26/2017   FREET4 0.80 03/17/2021   FREET4 0.69 09/12/2020   FREET4 0.66 03/18/2020   FREET4 0.69 11/13/2019   FREET4 0.80 07/10/2019   FREET4 0.65 03/11/2019   FREET4 0.77 11/05/2018   FREET4 0.50 (L) 07/16/2018   FREET4 0.63 11/25/2017   FREET4 1.10 07/26/2017   T3FREE 3.9 03/17/2021   T3FREE 3.6 09/12/2020   T3FREE 3.1 03/18/2020   T3FREE 3.5 11/13/2019  T3FREE 3.1 07/10/2019   T3FREE 2.9 03/11/2019   T3FREE 3.0 11/05/2018   T3FREE 2.9 07/16/2018   T3FREE 3.0 11/25/2017   T3FREE 3.6 07/26/2017   Her Graves' antibodies were elevated: Component     Latest Ref Rng & Units 04/15/2017  Thyrotropin Receptor Ab     0.00 - 1.75 IU/L 12.16 (H)   Lab Results  Component Value Date   TSI 222 (H) 03/17/2021   TSI 236 (H) 03/11/2019   TSI 319 (H) 07/26/2017   Pt denies: - feeling nodules in neck - hoarseness - dysphagia - choking  Pt does have a FH of thyroid ds.: 2nd cousin and niece with Graves ds. No FH of thyroid cancer. No h/o radiation tx to head or neck. No recent contrast studies. No herbal supplements. No Biotin use.   Pt. also has a history of kidney stones in 2012, 2014, 2016, 2018.  She has a history of lithotripsy.  She does not have active  Graves' ophthalmopathy.  She follows with ophthalmology every year. She saw her eye dr. At [facility] >> small cataract.  She had TAH several years ago. She developed hot flushes after this.  ROS: + see HPI  I reviewed pt's medications, allergies, PMH, social hx, family hx, and changes were documented in the history of present illness. Otherwise, unchanged from my initial visit note.  Past Medical History:  Diagnosis Date   Asthma    History of umbilical hernia    Hypertension    Renal stones    Past Surgical History:  Procedure Laterality Date   lithotrypsy     UMBILICAL HERNIA REPAIR  1990   Social History   Socioeconomic History   Marital status: Married    Spouse name: Not on file   Number of children: 34: 35 year old in 2019   Years of education: Not on file   Highest education level: Not on file  Occupational History    Transport planner  Social Needs   Financial resource strain: Not on file   Food insecurity:    Worry: Not on file    Inability: Not on file   Transportation needs:    Medical: Not on file    Non-medical: Not on file  Tobacco Use   Smoking status: Current Every Day Smoker    Packs/day: 1.00    Years: 39.00    Pack years: 39.00    Types: Cigarettes   Smokeless tobacco: Never Used   Tobacco comment: wants Nicotine gum. 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 1  Inhaler 0   CELEBREX 200 MG capsule Take 200 mg by mouth 2 (two) times daily.     cyclobenzaprine (FLEXERIL) 10 MG tablet Take 1 tablet (10 mg total) by mouth 2 (two) times daily as needed for muscle spasms. 20 tablet 0  Diclofenac Sodium 3 % GEL Apply 2 g topically 2 (two) times daily. 100 g 0   gabapentin (NEURONTIN) 300 MG capsule Take 1 capsule (300 mg total) by mouth at bedtime. 30 capsule 2   hydrochlorothiazide (HYDRODIURIL) 25 MG tablet Take 1 tablet (25 mg total) by mouth daily. 90 tablet 3   HYDROcodone-acetaminophen (NORCO) 10-325 MG tablet Take 1 tablet by mouth 2 (two) times daily as needed. 60 tablet 0   methimazole (TAPAZOLE) 5 MG tablet TAKE 1 TABLET BY MOUTH IN THE MORNING AND 1/2 (ONE-HALF) IN THE EVENING 135 tablet 0   predniSONE (DELTASONE) 20 MG tablet Take 2 tablets (40 mg total) by mouth daily with breakfast for 5 days. 10 tablet 0   tiZANidine (ZANAFLEX) 2 MG tablet Take 4 mg by mouth 2 (two) times daily.     No current facility-administered medications on file prior to visit.   No Known Allergies Family History  Problem Relation Age of Onset   Breast cancer Mother    Cancer Mother    Diabetes Father    Colon cancer Neg Hx    PE: BP 124/80 (BP Location: Right Arm, Patient Position: Sitting, Cuff Size: Normal)   Pulse 76   Ht '5\' 6"'$  (1.676 m)   Wt 198 lb 6.4 oz (90 kg)   LMP 03/14/2015 (Approximate)   SpO2 99%   BMI 32.02 kg/m  Wt Readings from Last 3 Encounters:  03/16/22 198 lb 6.4 oz (90 kg)  02/07/22 196 lb 12.8 oz (89.3 kg)  11/01/21 194 lb (88 kg)   Constitutional: overweight, in NAD, walks with cane Eyes: EOMI, no exophthalmos ENT: + symmetric, nonnodular, thyromegaly, no cervical lymphadenopathy Cardiovascular: RRR, No MRG Respiratory: CTA B Musculoskeletal: no deformities Skin: no rashes Neurological: no tremor with outstretched hands  ASSESSMENT: 1. Graves ds.   2.  Graves' ophthalmopathy  PLAN:  1. Patient with history of enlarged  thyroid discovered by patient when she was looking in the mirror in 03/2017.  Around that time, she was also found to have thyrotoxicosis.  Thyrotropin receptor antibodies were also elevated, pointing towards Graves' disease.  She was started on methimazole 10 mg once a day and Inderal 10 mg twice a day.  Retrospectively, she also noticed weight loss, heat intolerance, palpitations, tremors, increased appetite, anxiety.  Symptoms resolved after starting the above medications.  However, she was not compliant with her thyroid hormone checks and we could not adjust the methimazole dose in a timely fashion.  In 03/2019, we decreased the dose of methimazole and she again did not present for labs as recommended.  Also, she ran out of methimazole.  I strongly advised her against theses practices.  As of now, she is on 5 mg methimazole daily, which we continued at last visit. -No thyrotoxic signs or symptoms at today's visit.  She has a history of hot flashes, but they developed after TAH surgery is likely postmenopausal.  She gained 5 pounds since last visit.  No tremors, palpitations, anxiety, insomnia. -At today's visit, we we are planning to recheck her TFTs but we cannot do so now since she just started prednisone 3 days ago.  I advised him to return for labs and 3 weeks and will then change the methimazole dose accordingly -I refilled her methimazole prescription -I will see her back in a year, but sooner for labs  2.  Graves' ophthalmopathy -her exophthalmos has improved-not visible anymore -At today's visit, she mentions blurry vision, but no double vision, eye  pain, chemosis.  She has itchy eyes, possibly 2/2 exposure to fumes at work . -Her TSI antibodies were still elevated at last check, a year ago -We will repeat these at next lab draw -She follows with ophthalmology on a yearly basis.  Last exam was a year ago.  She plans to return to see them soon.  Component     Latest Ref Rng 04/06/2022  TSI      <140 % baseline 257 (H)   Triiodothyronine,Free,Serum     2.3 - 4.2 pg/mL 2.9   T4,Free(Direct)     0.60 - 1.60 ng/dL 0.66   TSH     0.35 - 5.50 uIU/mL 1.90   TSI antibodies are slightly higher than before, but thyroid function tests are normal.  Due to the elevated TSI antibodies, I will advise her to continue the current dose of methimazole.  Philemon Kingdom, MD PhD [facility] Endocrinology

## 2022-03-16 NOTE — Patient Instructions (Addendum)
Please continue methimazole 5 mg in a.m.  Please come back for labs in 3 weeks.  Please come back for a follow-up appointment in 1 year.

## 2022-04-06 ENCOUNTER — Other Ambulatory Visit (INDEPENDENT_AMBULATORY_CARE_PROVIDER_SITE_OTHER): Payer: BC Managed Care – PPO

## 2022-04-06 DIAGNOSIS — E0500 Thyrotoxicosis with diffuse goiter without thyrotoxic crisis or storm: Secondary | ICD-10-CM

## 2022-04-06 LAB — T3, FREE: T3, Free: 2.9 pg/mL (ref 2.3–4.2)

## 2022-04-06 LAB — TSH: TSH: 1.9 u[IU]/mL (ref 0.35–5.50)

## 2022-04-06 LAB — T4, FREE: Free T4: 0.66 ng/dL (ref 0.60–1.60)

## 2022-04-10 ENCOUNTER — Other Ambulatory Visit: Payer: Self-pay

## 2022-04-10 DIAGNOSIS — F1190 Opioid use, unspecified, uncomplicated: Secondary | ICD-10-CM

## 2022-04-10 LAB — THYROID STIMULATING IMMUNOGLOBULIN: TSI: 257 % baseline — ABNORMAL HIGH (ref <140)

## 2022-04-10 MED ORDER — HYDROCODONE-ACETAMINOPHEN 10-325 MG PO TABS: 1.0000 | ORAL_TABLET | Freq: Two times a day (BID) | ORAL | 0 refills | Status: DC | PRN

## 2022-04-20 ENCOUNTER — Other Ambulatory Visit: Payer: Self-pay | Admitting: Internal Medicine

## 2022-04-20 DIAGNOSIS — Z1231 Encounter for screening mammogram for malignant neoplasm of breast: Secondary | ICD-10-CM

## 2022-05-06 ENCOUNTER — Other Ambulatory Visit: Payer: Self-pay | Admitting: Internal Medicine

## 2022-05-06 DIAGNOSIS — F1190 Opioid use, unspecified, uncomplicated: Secondary | ICD-10-CM

## 2022-05-06 DIAGNOSIS — Z96651 Presence of right artificial knee joint: Secondary | ICD-10-CM

## 2022-05-07 MED ORDER — HYDROCODONE-ACETAMINOPHEN 10-325 MG PO TABS: 1.0000 | ORAL_TABLET | Freq: Two times a day (BID) | ORAL | 0 refills | Status: DC | PRN

## 2022-06-07 ENCOUNTER — Ambulatory Visit
Admission: RE | Admit: 2022-06-07 | Discharge: 2022-06-07 | Disposition: A | Discharge status: Home / Self Care | Payer: BC Managed Care – PPO | Source: Ambulatory Visit | Attending: Internal Medicine | Admitting: Internal Medicine

## 2022-06-07 ENCOUNTER — Other Ambulatory Visit: Payer: Self-pay

## 2022-06-07 DIAGNOSIS — F1190 Opioid use, unspecified, uncomplicated: Secondary | ICD-10-CM

## 2022-06-07 DIAGNOSIS — Z1231 Encounter for screening mammogram for malignant neoplasm of breast: Secondary | ICD-10-CM

## 2022-06-07 MED ORDER — HYDROCODONE-ACETAMINOPHEN 10-325 MG PO TABS: 1.0000 | ORAL_TABLET | Freq: Two times a day (BID) | ORAL | 0 refills | Status: DC | PRN

## 2022-06-12 ENCOUNTER — Ambulatory Visit (HOSPITAL_COMMUNITY)
Admission: EM | Admit: 2022-06-12 | Discharge: 2022-06-12 | Disposition: A | Discharge status: Home / Self Care | Payer: BC Managed Care – PPO

## 2022-06-12 DIAGNOSIS — F321 Major depressive disorder, single episode, moderate: Secondary | ICD-10-CM
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
## 2022-06-12 NOTE — Discharge Instructions (Addendum)
Base on the information you have provided and the presenting issue, outpatient services and resources for have been recommended.  It is imperative that you follow through with treatment recommendations within 5-7 days from the of discharge to mitigate further risk to your safety and mental well-being. A list of referrals has been provided below to get you started.  You are not limited to the list provided.  In case of an urgent crisis, you may contact the Mobile Crisis Unit with [facility] at [phone].   Writer arranged for the patient to have an appointment with the therapist, Tonette Lederer [facility] Licensed Clinical Mental Health Counselor, [place], [facility], [facility] in person on Monday 06-18-2022 at [facility], [facility] [address] [place], [place] [address]  [phone]  Are you feeling "some kind of way", unsettled in yourself, or having a difficult time identifying how to navigate through your world. You are not alone. We all encounter times when the life we are living becomes overwhelming and we wish for someone to hear the things we are feeling inside but are unable to express. We are looking to understand why these things are happening to [place], and how to feel at peace with ourselves. We want to know that these negative feelings will not always be with [place]. No, I do not have a magic wand that will solve all your worries.What I offer is the opportunity to listen to your story and help you develop  As a graduate of [employer], with a Masters of arts in counseling psychology, I serve a diverse population while personalizing care. The skills used come from art therapy, play therapy, CBT(Cognitive Behavioral Therapy), EMDR, and other specialized therapeutic interventions. I cater to my client's needs while using best practices. I started my counseling practice with the desire of "helping others heal" from the conflict they have faced  in life and their personal struggles. I have had the honor of assisting people overcome many of their life struggles and to move forward and enjoy the life that they wish to have. Give me a call and let me know how I can help you.
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
## 2022-06-12 NOTE — ED Notes (Signed)
Patient discharged by Vernard Gambles, NP with written and verbal instructions. Resources given.

## 2022-06-25 ENCOUNTER — Ambulatory Visit (INDEPENDENT_AMBULATORY_CARE_PROVIDER_SITE_OTHER): Payer: BC Managed Care – PPO

## 2022-06-25 ENCOUNTER — Ambulatory Visit
Admission: EM | Admit: 2022-06-25 | Discharge: 2022-06-25 | Disposition: A | Discharge status: Home / Self Care | Payer: BC Managed Care – PPO | Attending: Emergency Medicine | Admitting: Emergency Medicine

## 2022-06-25 DIAGNOSIS — J069 Acute upper respiratory infection, unspecified: Secondary | ICD-10-CM

## 2022-06-25 MED ORDER — ALBUTEROL SULFATE HFA 108 (90 BASE) MCG/ACT IN AERS: 2.0000 | INHALATION_SPRAY | Freq: Four times a day (QID) | RESPIRATORY_TRACT | 0 refills | Status: AC | PRN

## 2022-06-25 NOTE — ED Provider Notes (Signed)
[name]    CSN: [id number] Arrival date & time: 06/25/22  1451      History   Chief Complaint Chief Complaint  Patient presents with   Headache   Cough    HPI Christy Burke is a 62 y.o. female. Pt with cough for last week. Feels sick. Feels like she has chest congestion that she is not able to cough up.  Has been wheezing a little and a little short of breath at times.  Is out of her albuterol inhaler and would like a refill.  Also complains of headache yesterday that she thinks is related to using Dimetapp cold medicine, which she says she is not supposed to use because of her hypertension.  Patient does smoke.   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      Home Medications    Prior to Admission medications   Medication Sig Start Date End Date Taking? Authorizing Provider  albuterol (VENTOLIN HFA) 108 (90 Base)  MCG/ACT inhaler Inhale 2 puffs into the lungs every 6 (six) hours as needed for wheezing or shortness of breath. 06/25/22   Cathlyn Parsons, NP  CELEBREX 200 MG capsule Take 200 mg by mouth 2 (two) times daily. 03/13/22   [provider]  gabapentin (NEURONTIN) 300 MG capsule Take 1 capsule by mouth once daily at bedtime 05/07/22   Reymundo Poll, MD  hydrochlorothiazide (HYDRODIURIL) 25 MG tablet Take 1 tablet (25 mg total) by mouth daily. 02/07/22   Reymundo Poll, MD  HYDROcodone-acetaminophen (NORCO) 10-325 MG tablet Take 1 tablet by mouth 2 (two) times daily as needed. 06/07/22   Reymundo Poll, MD  methimazole (TAPAZOLE) 5 MG tablet TAKE 1 TABLET BY MOUTH IN THE MORNING 03/16/22   Carlus Pavlov, MD  tiZANidine (ZANAFLEX) 2 MG tablet Take 4 mg by mouth 2 (two) times daily as needed for muscle spasms. 03/12/22   [provider]    Family History Family History  Problem Relation Age of Onset   Breast cancer Mother    Cancer Mother    Diabetes Father    Colon cancer Neg Hx     Social History Social History   Tobacco Use   Smoking status: Every Day    Packs/day: 0.50    Years: 39.00    Additional pack years: 0.00    Total pack years:  19.50    Types: Cigarettes   Smokeless tobacco: Never   Tobacco comments:    Stop vaping; started back smoking.  Substance Use Topics   Alcohol use: No    Alcohol/week: 0.0 standard drinks of alcohol    Comment: Quit 9 yrs ago.   Drug use: No    Types: Cocaine    Comment: Quit several years ago, never did IVDU only smoked cocaine     Allergies   Patient has no known allergies.   Review of Systems Review of Systems  Respiratory:  Positive for cough.   Neurological:  Positive for headaches.     Physical Exam Triage Vital Signs ED Triage Vitals  Enc Vitals Group     BP 06/25/22 1514 115/79     Pulse Rate 06/25/22 1512 69     Resp 06/25/22 1512 18     Temp 06/25/22 1512 98.4 F (36.9 C)     Temp Source  06/25/22 1512 Oral     SpO2 06/25/22 1512 96 %     Weight --      Height --      Head Circumference --      Peak Flow --      Pain Score 06/25/22 1512 5     Pain Loc --      Pain Edu? --      Excl. in GC? --    No data found.  Updated Vital Signs BP 115/79   Pulse 69   Temp 98.4 F (36.9 C) (Oral)   Resp 18   LMP 03/14/2015 (Approximate)   SpO2 96%   Visual Acuity Right Eye Distance:   Left Eye Distance:   Bilateral Distance:    Right Eye Near:   Left Eye Near:    Bilateral Near:     Physical Exam Constitutional:      Appearance: She is well-developed.  HENT:     Right Ear: Tympanic membrane, ear canal and external ear normal.     Left Ear: Tympanic membrane, ear canal and external ear normal.     Nose: No congestion or rhinorrhea.     Mouth/Throat:     Mouth: Mucous membranes are moist.     Pharynx: Oropharynx is clear. No oropharyngeal exudate or posterior oropharyngeal erythema.  Cardiovascular:     Rate and Rhythm: Normal rate and regular rhythm.  Pulmonary:     Effort: Pulmonary effort is normal.     Breath sounds: Normal breath sounds.     Comments: Occasional cough heard Neurological:     Mental Status: She is alert.      UC Treatments / Results  Labs (all labs ordered are listed, but only abnormal results are displayed) Labs Reviewed - No data to display  EKG   Radiology DG Chest 2 View  Result Date: 06/25/2022 CLINICAL DATA:  Cough for 5 days EXAM: CHEST - 2 VIEW COMPARISON:  Chest x-ray dated September 25, 2016 FINDINGS: The heart size and mediastinal contours are within normal limits. Both lungs are clear. The visualized skeletal structures are unremarkable. IMPRESSION: No active cardiopulmonary disease. Electronically Signed   By: Allegra Lai M.D.   On: 06/25/2022 15:49    Procedures Procedures (including critical care time)  Medications Ordered in UC Medications - No data to display  Initial Impression / Assessment and Plan / UC  Course  I have reviewed the triage vital signs and the nursing notes.  Pertinent labs & imaging results that were available during  my care of the patient were reviewed by me and considered in my medical decision making (see chart for details).    Because patient felt like she had congestion in her chest that she was unable to cough up, I was concerned for pneumonia and obtained a chest x-ray that does not show any acute infection.  Reassured patient that she likely has a URI, refilled her albuterol inhaler, made recommendations for over-the-counter cold medicines that are better for her to take with her hypertension.  Final Clinical Impressions(s) / UC Diagnoses   Final diagnoses:  Upper respiratory tract infection, unspecified type     Discharge Instructions      Use your albuterol inhaler every 4-6 hours if you need it for shortness of breath or wheezing.   Try Coricidin cold medicine for your symptoms - it's safer to take for someone with high blood pressure.  Mucinex may also help your symptoms and is safe for you to take.    ED Prescriptions     Medication Sig Dispense Auth. Provider   albuterol (VENTOLIN HFA) 108 (90 Base) MCG/ACT inhaler Inhale 2 puffs into the lungs every 6 (six) hours as needed for wheezing or shortness of breath. 1 each Cathlyn Parsons, NP      PDMP not reviewed this encounter.   Cathlyn Parsons, NP 06/25/22 1750

## 2022-06-25 NOTE — Discharge Instructions (Signed)
Use your albuterol inhaler every 4-6 hours if you need it for shortness of breath or wheezing.   Try Coricidin cold medicine for your symptoms - it's safer to take for someone with high blood pressure.  Mucinex may also help your symptoms and is safe for you to take.

## 2022-06-25 NOTE — ED Triage Notes (Signed)
Pt presents with non productive cough and ongoing headache for past few days.

## 2022-06-30 DIAGNOSIS — J09X2 Influenza due to identified novel influenza A virus with other respiratory manifestations: Secondary | ICD-10-CM | POA: Insufficient documentation

## 2022-07-09 ENCOUNTER — Other Ambulatory Visit: Payer: Self-pay

## 2022-07-09 DIAGNOSIS — F1190 Opioid use, unspecified, uncomplicated: Secondary | ICD-10-CM

## 2022-07-09 MED ORDER — HYDROCODONE-ACETAMINOPHEN 10-325 MG PO TABS: 1.0000 | ORAL_TABLET | Freq: Two times a day (BID) | ORAL | 0 refills | Status: DC | PRN

## 2022-08-08 ENCOUNTER — Other Ambulatory Visit: Payer: Self-pay

## 2022-08-08 ENCOUNTER — Encounter: Payer: Self-pay | Admitting: Internal Medicine

## 2022-08-08 ENCOUNTER — Ambulatory Visit (INDEPENDENT_AMBULATORY_CARE_PROVIDER_SITE_OTHER): Payer: BC Managed Care – PPO | Admitting: Internal Medicine

## 2022-08-08 VITALS — BP 115/88 | HR 74 | Temp 97.7°F | Ht 66.0 in | Wt 194.7 lb

## 2022-08-08 DIAGNOSIS — E669 Obesity, unspecified: Secondary | ICD-10-CM | POA: Diagnosis not present

## 2022-08-08 DIAGNOSIS — Z96651 Presence of right artificial knee joint: Secondary | ICD-10-CM

## 2022-08-08 DIAGNOSIS — F17210 Nicotine dependence, cigarettes, uncomplicated: Secondary | ICD-10-CM

## 2022-08-08 DIAGNOSIS — Z6830 Body mass index (BMI) 30.0-30.9, adult: Secondary | ICD-10-CM

## 2022-08-08 DIAGNOSIS — F1190 Opioid use, unspecified, uncomplicated: Secondary | ICD-10-CM

## 2022-08-08 DIAGNOSIS — I10 Essential (primary) hypertension: Secondary | ICD-10-CM | POA: Diagnosis not present

## 2022-08-08 DIAGNOSIS — E0500 Thyrotoxicosis with diffuse goiter without thyrotoxic crisis or storm: Secondary | ICD-10-CM

## 2022-08-08 MED ORDER — HYDROCODONE-ACETAMINOPHEN 10-325 MG PO TABS: 1.0000 | ORAL_TABLET | Freq: Two times a day (BID) | ORAL | 0 refills | Status: DC | PRN

## 2022-08-08 MED ORDER — TIZANIDINE HCL 2 MG PO TABS: 4.0000 mg | ORAL_TABLET | Freq: Two times a day (BID) | ORAL | 1 refills | Status: DC | PRN

## 2022-08-08 NOTE — Progress Notes (Signed)
This is a Psychologist, occupational Note.  The care of the patient was discussed with Dr. Antony Contras and the assessment and plan was formulated with their assistance.  Please see their note for official documentation of the patient encounter.   Subjective:   Patient ID: Christy Burke female   DOB: 1960/05/22 62 y.o.   MRN: [id number]  HPI: Christy Burke is a 62 y.o. woman with a PMH of Graves disease, back pain, and total knee replacement that is here today for a follow-up. She is not working at the moment due to her recent total knee replacement. She is still working on recovery and doing exercises at home to improve strength of her leg. She was previously seeing a physical therapist, but prefers to do exercises at home. She finds that her pain is tolerable with her medication regimen, but has goals to taper off of Norco in the future.   She is currently taking HCTZ for HTN and tizanidine, and Norco for pain as prescribed and is tolerating them well. Her blood pressure today indicates well-controlled hypertension with her current medication regimen.   She is following up with endocrinology for her hyperthyroidism that is managed with methimazole, which she takes regularly. She denies palpitations, sweating, diarrhea, or fever.  She has noted recent vision changes, for which she saw her eye doctor recently and got new glasses. She was also told that she has a small cataract that is not of concern.  She has noticed 3 lb weightloss since last follow up, and is still within similar weight range as previous visits.  She sometimes notes heat intolerance, but also associates this with her hysterectomy.   She relays that she recently went to urgent care 2-3 weeks ago for an incidence of flu, which was treated. She is up to date on her flu vaccine.   For the details of today's visit, please refer to the assessment and plan.     Work, knee replacement Past Medical History:  Diagnosis Date   Asthma     History of umbilical hernia    Hypertension    Renal stones    Current Outpatient Medications  Medication Sig Dispense Refill   albuterol (VENTOLIN HFA) 108 (90 Base) MCG/ACT inhaler Inhale 2 puffs into the lungs every 6 (six) hours as needed for wheezing or shortness of breath. 1 each 0   CELEBREX 200 MG capsule Take 200 mg by mouth 2 (two) times daily.     gabapentin (NEURONTIN) 300 MG capsule Take 1 capsule by mouth once daily at bedtime 30 capsule 3   hydrochlorothiazide (HYDRODIURIL) 25 MG tablet Take 1 tablet (25 mg total) by mouth daily. 90 tablet 3   HYDROcodone-acetaminophen (NORCO) 10-325 MG tablet Take 1 tablet by mouth 2 (two) times daily as needed. 60 tablet 0   methimazole (TAPAZOLE) 5 MG tablet TAKE 1 TABLET BY MOUTH IN THE MORNING 90 tablet 3   tiZANidine (ZANAFLEX) 2 MG tablet Take 2 tablets (4 mg total) by mouth 2 (two) times daily as needed for muscle spasms. 30 tablet 1   No current facility-administered medications for this visit.   Family History  Problem Relation Age of Onset   Breast cancer Mother    Cancer Mother    Diabetes Father    Colon cancer Neg Hx    Social History   Socioeconomic History   Marital status: Married    Spouse name: Not on file   Number of children: Not on file   Years  of education: Not on file   Highest education level: Not on file  Occupational History   Not on file  Tobacco Use   Smoking status: Every Day    Packs/day: 0.50    Years: 39.00    Additional pack years: 0.00    Total pack years: 19.50    Types: Cigarettes   Smokeless tobacco: Never   Tobacco comments:    Sometimes less.  Substance and Sexual Activity   Alcohol use: No    Alcohol/week: 0.0 standard drinks of alcohol    Comment: Quit 9 yrs ago.   Drug use: No    Types: Cocaine    Comment: Quit several years ago, never did IVDU only smoked cocaine   Sexual activity: Yes    Birth control/protection: Other-see comments    Comment: In same sex relationship for  19 years  Other Topics Concern   Not on file  Social History Narrative   Not on file   Social Determinants of Health   Financial Resource Strain: Low Risk  (02/07/2022)   Overall Financial Resource Strain (CARDIA)    Difficulty of Paying Living Expenses: Not hard at all  Food Insecurity: No Food Insecurity (02/07/2022)   Hunger Vital Sign    Worried About Running Out of Food in the Last Year: Never true    Ran Out of Food in the Last Year: Never true  Transportation Needs: No Transportation Needs (02/07/2022)   PRAPARE - Administrator, Civil Service (Medical): No    Lack of Transportation (Non-Medical): No  Physical Activity: Not on file  Stress: Not on file  Social Connections: Socially Integrated (02/07/2022)   Social Connection and Isolation Panel [NHANES]    Frequency of Communication with Friends and Family: More than three times a week    Frequency of Social Gatherings with Friends and Family: Twice a week    Attends Religious Services: 1 to 4 times per year    Active Member of [employer] or Organizations: No    Attends Engineer, structural: 1 to 4 times per year    Marital Status: Married   Review of Systems: Pertinent items are noted in HPI. Objective:  Physical Exam: Vitals:   08/08/22 1018  BP: 115/88  Pulse: 74  Temp: 97.7 F (36.5 C)  TempSrc: Oral  SpO2: 99%  Weight: 194 lb 11.2 oz (88.3 kg)  Height: 5\' 6"  (1.676 m)    Constitutional: NAD, appears comfortable HEENT: Atraumatic, normocephalic. PERRL, anicteric sclera.  Neck: Supple, trachea midline.  Enlarged, non-tender goiter.  Cardiovascular: RRR, no murmurs, rubs, or gallops.  Pulmonary/Chest: CTAB, no wheezes, rales, or rhonchi. No chest wall abnormalities.  Extremities: Warm and well perfused.   Skin: No rashes or erythema  Psychiatric: Normal mood and affect  Assessment & Plan:   HTN (hypertension) Blood pressure well controlled today on regimen of HCTZ. Patient to continue this  medication. Follow up BMP.   Graves disease Hyperthyroidism managed by endocrinology and controlled with methimazole. Patient to continue regimen indicated by endocrinology.   History of total knee replacement, right Patient to continue medication regimen of Norco and tizanidine for pain. Doing well on Norco, does wish to eventually come off of this medication. She is still recovering from recent surgery; doing rehab at home. Continue BID norco for now, she is in agreement to begin taper at follow up in 3 months.   Obesity (BMI 30.0-34.9) BMI is elevated at 31.4. Discussed recommendations for weight loss to  achieve recommended BMI < 25.

## 2022-08-08 NOTE — Assessment & Plan Note (Signed)
BMI is elevated at 31.4. Discussed recommendations for weight loss to achieve recommended BMI < 25.

## 2022-08-08 NOTE — Assessment & Plan Note (Signed)
Hyperthyroidism managed by endocrinology and controlled with methimazole. Patient to continue regimen indicated by endocrinology.

## 2022-08-08 NOTE — Assessment & Plan Note (Addendum)
Patient to continue medication regimen of Norco and tizanidine for pain. Doing well on Norco, does wish to eventually come off of this medication. She is still recovering from recent surgery; doing rehab at home. Continue BID norco for now, she is in agreement to begin taper at follow up in 3 months.

## 2022-08-08 NOTE — Patient Instructions (Signed)
Hi [name],   It was wonderful seeing you today! I have sent a refill of your Norco to the pharmacy for 3 months, please follow up in 3 months. We will discuss a plan to taper you off Norco at that visit. I will also be sending in a refill of your tizanidine. Continue taking your other medications as prescribed. We will send you a myChart message with your lab results today and will call you if they are abnormal.   Thank you!

## 2022-08-08 NOTE — Assessment & Plan Note (Addendum)
Blood pressure well controlled today on regimen of HCTZ. Patient to continue this medication. Follow up BMP.

## 2022-08-10 LAB — BMP8+ANION GAP
Anion Gap: 18 mmol/L (ref 10.0–18.0)
BUN/Creatinine Ratio: 13 (ref 9–23)
BUN: 11 mg/dL (ref 6–24)
CO2: 24 mmol/L (ref 20–29)
Calcium: 9.9 mg/dL (ref 8.7–10.2)
Chloride: 101 mmol/L (ref 96–106)
Creatinine, Ser: 0.86 mg/dL (ref 0.57–1.00)
Glucose: 76 mg/dL (ref 70–99)
Potassium: 4.1 mmol/L (ref 3.5–5.2)
Sodium: 143 mmol/L (ref 134–144)
eGFR: 78 mL/min/{1.73_m2} (ref >59)

## 2022-10-03 ENCOUNTER — Ambulatory Visit (INDEPENDENT_AMBULATORY_CARE_PROVIDER_SITE_OTHER): Payer: BC Managed Care – PPO | Admitting: Internal Medicine

## 2022-10-03 ENCOUNTER — Encounter: Payer: Self-pay | Admitting: Internal Medicine

## 2022-10-03 ENCOUNTER — Other Ambulatory Visit: Payer: Self-pay

## 2022-10-03 DIAGNOSIS — M5137 Other intervertebral disc degeneration, lumbosacral region: Secondary | ICD-10-CM

## 2022-10-03 DIAGNOSIS — Z96651 Presence of right artificial knee joint: Secondary | ICD-10-CM

## 2022-10-03 DIAGNOSIS — R4189 Other symptoms and signs involving cognitive functions and awareness: Secondary | ICD-10-CM | POA: Diagnosis not present

## 2022-10-03 NOTE — Assessment & Plan Note (Signed)
Instructed patient to schedule in person follow up for further evaluation to discuss next steps as far as repeating imaging, considering IR referral for epidural steroid injection or physical therapy.

## 2022-10-03 NOTE — Assessment & Plan Note (Addendum)
Disability forms completed per patient request as she continues to have significant pain and limitation in her mobility despite aggressive treatment with TKA and prolonged physical therapy. She is on chronic opioids for this. In addition to her chronic knee pain, she also has acute on chronic pain in her lumbar spine I suspect due to her know DDD with facet arthropathy at L5-S1. Instructed her to follow up for this a her convenience.

## 2022-10-03 NOTE — Assessment & Plan Note (Signed)
Self reported, progressive memory issues. She is concerned for dementia and has requested to be evaluated for this. Asked her to schedule in person follow up to discuss further.

## 2022-10-03 NOTE — Progress Notes (Signed)
    This is a telephone encounter between Christy Burke and Reymundo Poll on 10/03/2022 for disability paperwork. The visit was conducted with the patient located at home and Reymundo Poll at [facility]. The patient's identity was confirmed using their DOB and current address. The patient has consented to being evaluated through a telephone encounter and understands the associated risks (an examination cannot be done and the patient may need to come in for an appointment) / benefits (allows the patient to remain at home, decreasing exposure to coronavirus). I personally spent 12 minutes on medical discussion.   CC: Request for disability paperwork  HPI:  Patient is a very pleasant 61 yo F requesting disability forms be completed for her employer. She has a history of severe right knee OA s/p total knee arthroplasty in November of 2023. Unfortunately she has continued to have severe pain and limited mobility despite knee replacement surgery. She has been participating in physical therapy but is having difficulty keeping up with her job requirements due to pain and mobility limitations. Recently, she has also developed lower back pain with radiculopathy on the right side. She has a history of lumbar degenerative disc disease seen on prior imaging with facet arthropathy at L5-S1. This was noted on a CT renal stone study in 2016. She has not had follow up imaging since.     Assessment & Plan:   Degenerative disc disease at L5-S1 level Instructed patient to schedule in person follow up for further evaluation to discuss next steps as far as repeating imaging, considering IR referral for epidural steroid injection or physical therapy.   History of total knee replacement, right Disability forms completed per patient request as she continues to have significant pain and limitation in her mobility despite aggressive treatment with TKA and prolonged physical therapy. She is on chronic opioids for this. In  addition to her chronic knee pain, she also has acute on chronic pain in her lumbar spine I suspect due to her know DDD with facet arthropathy at L5-S1. Instructed her to follow up for this a her convenience.    Cognitive impairment Self reported, progressive memory issues. She is concerned for dementia and has requested to be evaluated for this. Asked her to schedule in person follow up to discuss further.

## 2022-10-17 ENCOUNTER — Ambulatory Visit (INDEPENDENT_AMBULATORY_CARE_PROVIDER_SITE_OTHER): Payer: BC Managed Care – PPO | Admitting: Internal Medicine

## 2022-10-17 ENCOUNTER — Encounter: Payer: Self-pay | Admitting: Internal Medicine

## 2022-10-17 VITALS — BP 131/88 | HR 65 | Temp 97.8°F | Ht 66.0 in | Wt 194.0 lb

## 2022-10-17 DIAGNOSIS — Z20822 Contact with and (suspected) exposure to covid-19: Secondary | ICD-10-CM | POA: Insufficient documentation

## 2022-10-17 DIAGNOSIS — F1190 Opioid use, unspecified, uncomplicated: Secondary | ICD-10-CM

## 2022-10-17 DIAGNOSIS — F4321 Adjustment disorder with depressed mood: Secondary | ICD-10-CM | POA: Insufficient documentation

## 2022-10-17 DIAGNOSIS — F4320 Adjustment disorder, unspecified: Secondary | ICD-10-CM | POA: Insufficient documentation

## 2022-10-17 DIAGNOSIS — F324 Major depressive disorder, single episode, in partial remission: Secondary | ICD-10-CM | POA: Insufficient documentation

## 2022-10-17 MED ORDER — HYDROCODONE-ACETAMINOPHEN 10-325 MG PO TABS: 1.0000 | ORAL_TABLET | Freq: Two times a day (BID) | ORAL | 0 refills | Status: DC | PRN

## 2022-10-17 NOTE — Assessment & Plan Note (Addendum)
Her current symptoms are consistent with an adjustment disorder. Her symptoms are severe enough that I think she would benefit from counseling, however we decided to hold off on treatment with an SSRI for now. She will send her disability forms from her employer and I will complete this when I have it. I have placed a referral for her to meet with Marena Chancy, our clinic counselor.

## 2022-10-17 NOTE — Addendum Note (Signed)
Addended by: Bufford Spikes on: 10/17/2022 02:11 PM   Modules accepted: Orders

## 2022-10-17 NOTE — Assessment & Plan Note (Signed)
We had discussed tapering at her last visit, but given her current adjustment disorder we are holding off and will continue current dose for now. Reviewed PDMP. Sent refill to pharmacy.

## 2022-10-17 NOTE — Patient Instructions (Signed)
Christy Burke,  It was a pleasure to see you. You will be called to schedule an appointment for counseling. Please follow up with me again in 3 months. Please send me a copy of your forms for work and I will have this completed for you.  If you have any questions or concerns, call our clinic at [phone] or after hours call [phone] and ask for the internal medicine resident on call.   Thank you!  Dr. Reece Agar

## 2022-10-17 NOTE — Progress Notes (Signed)
Subjective:   Patient ID: Christy Burke female   DOB: 1960-04-05 62 y.o.   MRN: [id number]  HPI: [name] is a 62 y.o. female with past medical history outlined below here for symptoms of depression. She initially scheduled this appointment to discuss concerns for cognitive impairment / dementia, however after speaking with her she tells me that her dad has been in poor health. She has been traveling back and forth to [place] to help take care of him and she is having a difficult time coping with this. She feels guilty for not living closer. She reports depressed mood, poor sleep; she is tearful on today's exam. This has been interfering with her ability to work and she would like to apply for short term disability. She denies ever having depression before, current symptoms have been going on for about 3 weeks. She has never been treated for depression or anxiety in the past.    Past Medical History:  Diagnosis Date   Asthma    History of umbilical hernia    Hypertension    Renal stones    Current Outpatient Medications  Medication Sig Dispense Refill   albuterol (VENTOLIN HFA) 108 (90 Base) MCG/ACT inhaler Inhale 2 puffs into the lungs every 6 (six) hours as needed for wheezing or shortness of breath. 1 each 0   CELEBREX 200 MG capsule Take 200 mg by mouth 2 (two) times daily.     gabapentin (NEURONTIN) 300 MG capsule Take 1 capsule by mouth once daily at bedtime 30 capsule 3   hydrochlorothiazide (HYDRODIURIL) 25 MG tablet Take 1 tablet (25 mg total) by mouth daily. 90 tablet 3   HYDROcodone-acetaminophen (NORCO) 10-325 MG tablet Take 1 tablet by mouth 2 (two) times daily as needed. 60 tablet 0   methimazole (TAPAZOLE) 5 MG tablet TAKE 1 TABLET BY MOUTH IN THE MORNING 90 tablet 3   tiZANidine (ZANAFLEX) 2 MG tablet Take 2 tablets (4 mg total) by mouth 2 (two) times daily as needed for muscle spasms. 30 tablet 1   No current facility-administered medications for this visit.   Family  History  Problem Relation Age of Onset   Breast cancer Mother    Cancer Mother    Diabetes Father    Colon cancer Neg Hx    Social History   Socioeconomic History   Marital status: Married    Spouse name: Not on file   Number of children: Not on file   Years of education: Not on file   Highest education level: Not on file  Occupational History   Not on file  Tobacco Use   Smoking status: Every Day    Current packs/day: 0.50    Average packs/day: 0.5 packs/day for 39.0 years (19.5 ttl pk-yrs)    Types: Cigarettes   Smokeless tobacco: Never   Tobacco comments:    Sometimes less.  Substance and Sexual Activity   Alcohol use: No    Alcohol/week: 0.0 standard drinks of alcohol    Comment: Quit 9 yrs ago.   Drug use: No    Types: Cocaine    Comment: Quit several years ago, never did IVDU only smoked cocaine   Sexual activity: Yes    Birth control/protection: Other-see comments    Comment: In same sex relationship for 19 years  Other Topics Concern   Not on file  Social History Narrative   Not on file   Social Determinants of Health   Financial Resource Strain: Low Risk  (02/07/2022)  Overall Financial Resource Strain (CARDIA)    Difficulty of Paying Living Expenses: Not hard at all  Food Insecurity: No Food Insecurity (02/07/2022)   Hunger Vital Sign    Worried About Running Out of Food in the Last Year: Never true    Ran Out of Food in the Last Year: Never true  Transportation Needs: No Transportation Needs (02/07/2022)   PRAPARE - Administrator, Civil Service (Medical): No    Lack of Transportation (Non-Medical): No  Physical Activity: Not on file  Stress: Not on file  Social Connections: Socially Integrated (02/07/2022)   Social Connection and Isolation Panel [NHANES]    Frequency of Communication with Friends and Family: More than three times a week    Frequency of Social Gatherings with Friends and Family: Twice a week    Attends Religious Services:  1 to 4 times per year    Active Member of Clubs or Organizations: No    Attends Engineer, structural: 1 to 4 times per year    Marital Status: Married     Objective:  Physical Exam:  Vitals:   10/17/22 0812 10/17/22 0844  BP: (!) 153/99 131/88  Pulse: 72 65  Temp: 97.8 F (36.6 C)   SpO2: 100%   Weight: 194 lb (88 kg)   Height: 5\' 6"  (1.676 m)     Constitutional: NAD, well appearing  Cardiovascular: RRR, no m/r/g Pulmonary/Chest: Clear bilaterally, normal effort Extremities: Right knee with TKA scar well healed, slight swelling, no warmth  Psychiatric: Tearful, depressed affect   Assessment & Plan:   Adjustment disorder Her current symptoms are consistent with an adjustment disorder. Her symptoms are severe enough that I think she would benefit from counseling, however we decided to hold off on treatment with an SSRI for now. She will send her disability forms from her employer and I will complete this when I have it. I have placed a referral for her to meet with Marena Chancy, our clinic counselor.   Chronic, continuous use of opioids We had discussed tapering at her last visit, but given her current adjustment disorder we are holding off and will continue current dose for now. Reviewed PDMP. Sent refill to pharmacy.   Close exposure to COVID-19 virus Asymptomatic. Had a recent exposure to an individual with confirmed COVID-19 and requesting to be tested. Has planned travel up coming. PCR swab sent - patient will f/u results on my chart in 1-2 days.

## 2022-10-18 NOTE — Assessment & Plan Note (Signed)
Asymptomatic. Had a recent exposure to an individual with confirmed COVID-19 and requesting to be tested. Has planned travel up coming. PCR swab sent - patient will f/u results on my chart in 1-2 days.

## 2022-10-19 LAB — NOVEL CORONAVIRUS, NAA: SARS-CoV-2, NAA: NOT DETECTED

## 2022-10-31 ENCOUNTER — Ambulatory Visit: Payer: BC Managed Care – PPO | Admitting: Licensed Clinical Social Worker

## 2022-10-31 DIAGNOSIS — F4321 Adjustment disorder with depressed mood: Secondary | ICD-10-CM

## 2022-10-31 NOTE — BH Specialist Note (Unsigned)
Integrated Behavioral Health via Telemedicine Visit  10/31/2022 Christy Burke [id number]  Number of Integrated Behavioral Health Clinician visits: 1- Initial Visit  Session Start time: 1500   Session End time: 1530  Total time in minutes: 30   Referring Provider: Reymundo Poll, MD Patient/Family location: Home [facility] Provider location: Office All persons participating in visit: [facility] and Patient Types of Service: Telephone visit and Introduction only  I connected with [name] and/or Drucie Ip  via  Telephone or Video Enabled Telemedicine Application  (Video is Caregility application) and verified that I am speaking with the correct person using two identifiers. Discussed confidentiality: Yes   I discussed the limitations of telemedicine and the availability of in person appointments.  Discussed there is a possibility of technology failure and discussed alternative modes of communication if that failure occurs.  I discussed that engaging in this telemedicine visit, they consent to the provision of behavioral healthcare and the services will be billed under their insurance.  Patient and/or legal guardian expressed understanding and consented to Telemedicine visit: Yes   [facility] introduced self to patient on today and explained services. Patient stated she understood and agreed to receiving services. Patient prefers in person visits and patient has been scheduled for 09/12 at 1:30 pm in person.  I discussed the assessment and treatment plan with the patient and/or parent/guardian. They were provided an opportunity to ask questions and all were answered. They agreed with the plan and demonstrated an understanding of the instructions.   They were advised to call back or seek an in-person evaluation if the symptoms worsen or if the condition fails to improve as anticipated.  Christen Butter, MSW, LCSW-A She/Her Behavioral Health Clinician [facility]  Internal Medicine  Center Direct [phone]  Fax [phone] Main Office Phone: [phone] [address] [place] [place], [place] [address] Website: [facility] Internal Medicine [facility]  [place], [place]  [place]

## 2022-11-15 ENCOUNTER — Ambulatory Visit (INDEPENDENT_AMBULATORY_CARE_PROVIDER_SITE_OTHER): Payer: Self-pay | Admitting: Licensed Clinical Social Worker

## 2022-11-15 DIAGNOSIS — F4321 Adjustment disorder with depressed mood: Secondary | ICD-10-CM

## 2022-11-15 NOTE — BH Specialist Note (Addendum)
Integrated Behavioral Health via Telemedicine Visit  11/15/2022 Christy Burke [id number]  Number of Integrated Behavioral Health Clinician visits: 1- Initial Visit  Session Start time: 1500   Session End time: 1530  Total time in minutes: 30   Referring Provider: Reymundo Poll, MD Patient/Family location: Home [facility] Provider location: Office All persons participating in visit: Patient and Christy Burke Types of Service: Introduction only  I connected with Christy Burke  via  Telephone and verified that I am speaking with the correct person using two identifiers. Discussed confidentiality: Yes   I discussed the limitations of telemedicine and the availability of in person appointments.  Discussed there is a possibility of technology failure and discussed alternative modes of communication if that failure occurs.  I discussed that engaging in this telemedicine visit, they consent to the provision of behavioral healthcare and the services will be billed under their insurance.  Patient and/or legal guardian expressed understanding and consented to Telemedicine visit: Yes   Presenting Concerns: Patient and/or family reports the following symptoms/concerns: During today's session, I introduced myself to the patient, explained my role as a Visual merchandiser [facility]), and provided my contact information. The patient, a 62 year old [place] American female with a history of adjustment disorder with depression, indicated she understood the nature of our professional relationship. As we move forward with treatment, our plan will focus on addressing the patient's adjustment disorder and associated depressive symptoms. We will utilize cognitive-behavioral therapy (CBT) techniques to help identify and modify negative thought patterns and behaviors that may be contributing to her distress. Additionally, we will work on developing effective coping strategies to manage stressors and improve overall  emotional regulation. The treatment plan will also incorporate mindfulness exercises and relaxation techniques to reduce anxiety and enhance the patient's ability to navigate challenging situations. We will explore the patient's support system and work on strengthening her social connections as a protective factor against depressive symptoms. Regular mood monitoring and symptom tracking will be implemented to assess progress and adjust interventions as needed. If deemed necessary, we may consider a referral for medication evaluation to complement the psychotherapeutic approach. Our sessions will be structured to provide a supportive environment where the patient can process her experiences and develop resilience in the face of life transitions and stressors.  Patient and/or Family's Strengths/Protective Factors: Sense of purpose  Goals Addressed: Patient will:  Reduce symptoms of: depression   Increase knowledge and/or ability of: coping skills   Demonstrate ability to: Increase healthy adjustment to current life circumstances  Progress towards Goals: Ongoing  Interventions: Interventions utilized:  CBT Cognitive Behavioral Therapy Standardized Assessments completed: PHQ-SADS  Patient and/or Family Response: Patient agreed to ongoing services    Plan: Follow up with behavioral health clinician on : Patient scheduled for September 12 at 1:30 pm  I discussed the assessment and treatment plan with the patient and/or parent/guardian. They were provided an opportunity to ask questions and all were answered. They agreed with the plan and demonstrated an understanding of the instructions.   They were advised to call back or seek an in-person evaluation if the symptoms worsen or if the condition fails to improve as anticipated.  Christy Burke, MSW, LCSW-A She/Her Behavioral Health Clinician [facility]  Internal Medicine Center Direct Dial:[phone]  Fax [phone] Main Office Phone:  [phone] [address] [place] [place], [place] [address] Website: [facility] Internal Medicine [facility]  [place], [place]  [place]

## 2022-11-21 ENCOUNTER — Encounter: Payer: Self-pay | Admitting: Internal Medicine

## 2022-11-21 ENCOUNTER — Ambulatory Visit (INDEPENDENT_AMBULATORY_CARE_PROVIDER_SITE_OTHER): Payer: Medicaid Other | Admitting: Licensed Clinical Social Worker

## 2022-11-21 DIAGNOSIS — F4321 Adjustment disorder with depressed mood: Secondary | ICD-10-CM

## 2022-11-21 NOTE — BH Specialist Note (Signed)
Patient no-showed today's appointment; appointment was for Telephone visit at 1:30pm  Behavioral Health Clinician [facility] from here on out)  attempted patient  via Telephone after patient rescheduled her in-person visit.  Marland Kitchen B[facility] left a  VM.   Patient will need to reschedule appointment by calling Internal medicine center [phone].  Christen Butter, MSW, LCSW-A She/Her Behavioral Health Clinician [facility]  Internal Medicine Center Direct [phone]  Fax [phone] Main Office Phone: [phone] [address] [place] [place], [place] [address] Website: [facility] Internal Medicine [facility]  [place], [place]  [place]

## 2022-12-10 ENCOUNTER — Other Ambulatory Visit: Payer: Self-pay

## 2022-12-10 ENCOUNTER — Ambulatory Visit (INDEPENDENT_AMBULATORY_CARE_PROVIDER_SITE_OTHER): Payer: Self-pay | Admitting: Licensed Clinical Social Worker

## 2022-12-10 DIAGNOSIS — I10 Essential (primary) hypertension: Secondary | ICD-10-CM

## 2022-12-10 DIAGNOSIS — F4321 Adjustment disorder with depressed mood: Secondary | ICD-10-CM

## 2022-12-10 MED ORDER — HYDROCHLOROTHIAZIDE 25 MG PO TABS: 25.0000 mg | ORAL_TABLET | Freq: Every day | ORAL | 3 refills | Status: DC

## 2022-12-10 NOTE — BH Specialist Note (Signed)
Patient no-showed today's appointment; appointment was for Telephone visit at 1:30pm  Behavioral Health Clinician [facility] from here on out)  attempted patient via Telephone   [facility] contacted patient from telephone number [phone]. BHC unable to leave a  VM.   Patient will need to reschedule appointment by calling Internal medicine center [phone].  Christen Butter, MSW, LCSW-A She/Her Behavioral Health Clinician [facility]  Internal Medicine Center Direct Dial:[phone]  Fax [phone] Main Office Phone: [phone] [address] [place] [place], [place] [address] Website: [facility] Internal Medicine [facility]  [place], [place]  [place]

## 2022-12-12 ENCOUNTER — Ambulatory Visit: Payer: Medicaid Other | Admitting: *Deleted

## 2022-12-12 ENCOUNTER — Other Ambulatory Visit: Payer: Self-pay

## 2022-12-12 DIAGNOSIS — F1190 Opioid use, unspecified, uncomplicated: Secondary | ICD-10-CM

## 2022-12-12 DIAGNOSIS — Z23 Encounter for immunization: Secondary | ICD-10-CM

## 2022-12-12 MED ORDER — HYDROCODONE-ACETAMINOPHEN 10-325 MG PO TABS: 1.0000 | ORAL_TABLET | Freq: Two times a day (BID) | ORAL | 0 refills | Status: DC | PRN

## 2022-12-18 ENCOUNTER — Ambulatory Visit (AMBULATORY_SURGERY_CENTER): Payer: Medicaid Other

## 2022-12-18 VITALS — Ht 66.0 in | Wt 197.0 lb

## 2022-12-18 DIAGNOSIS — Z1211 Encounter for screening for malignant neoplasm of colon: Secondary | ICD-10-CM

## 2022-12-18 MED ORDER — NA SULFATE-K SULFATE-MG SULF 17.5-3.13-1.6 GM/177ML PO SOLN: 1.0000 | Freq: Once | ORAL | 0 refills | Status: AC

## 2022-12-18 NOTE — Progress Notes (Signed)

## 2022-12-31 ENCOUNTER — Telehealth: Payer: Self-pay | Admitting: Internal Medicine

## 2022-12-31 NOTE — Telephone Encounter (Signed)
Updated instructions sent to patient per her request as she changed procedure date;

## 2022-12-31 NOTE — Telephone Encounter (Signed)
Patient rescheduled procedure. Please update prep instructions. 

## 2023-01-07 ENCOUNTER — Encounter: Payer: Self-pay | Admitting: Internal Medicine

## 2023-01-10 ENCOUNTER — Telehealth: Payer: Self-pay

## 2023-01-10 ENCOUNTER — Other Ambulatory Visit: Payer: Self-pay

## 2023-01-10 ENCOUNTER — Ambulatory Visit: Payer: Medicaid Other | Admitting: Licensed Clinical Social Worker

## 2023-01-10 DIAGNOSIS — F1190 Opioid use, unspecified, uncomplicated: Secondary | ICD-10-CM

## 2023-01-10 DIAGNOSIS — F4321 Adjustment disorder with depressed mood: Secondary | ICD-10-CM

## 2023-01-10 MED ORDER — HYDROCODONE-ACETAMINOPHEN 10-325 MG PO TABS: 1.0000 | ORAL_TABLET | Freq: Two times a day (BID) | ORAL | 0 refills | Status: DC | PRN

## 2023-01-10 NOTE — BH Specialist Note (Signed)
[facility] contacted and spoke with patient. Patient stated she was driving in the car from [place] and needed to reschedule. Patient requested to reschedule for 11/12 at 9:30am via telephone.   Christen Butter, MSW, LCSW-A She/Her Behavioral Health Clinician [facility]  Internal Medicine Center Direct Dial:[phone]  Fax [phone] Main Office Phone: [phone] [address] [place] [place], [place] [address] Website: [facility] Internal Medicine [facility]  [place], [place]  [place]

## 2023-01-10 NOTE — Telephone Encounter (Addendum)
Prior Authorization for patient (Hydrocodone-Acetaminophen 10-325 mg) came through on cover my meds was submitted with last office notes awaiting approval or denial.  WUJ:[id number]

## 2023-01-11 NOTE — Telephone Encounter (Signed)
Decision:Approved  Christy Burke (Key: [id number]) PA Case ID #: [id number] Need Help? Call us at [phone] Outcome Approved today by [facility] 2017 NCPDP Request Reference Number: [id number]. HYDROCO/APAP TAB 10-325MG  is approved through 07/11/2023. For further questions, call [employer] at [phone]. Authorization Expiration Date: 07/11/2023 Drug HYDROcodone-Acetaminophen 10-325MG  tablets ePA cloud logo Form [employer] Electronic Prior Authorization Form [phone] NCPDP)

## 2023-01-15 ENCOUNTER — Ambulatory Visit (INDEPENDENT_AMBULATORY_CARE_PROVIDER_SITE_OTHER): Payer: Medicaid Other | Admitting: Licensed Clinical Social Worker

## 2023-01-15 DIAGNOSIS — F4321 Adjustment disorder with depressed mood: Secondary | ICD-10-CM

## 2023-01-15 NOTE — BH Specialist Note (Signed)
Patient no-showed today's appointment; appointment was for Telephone visit at 9:30am  Behavioral Health Clinician [facility] from here on out)  attempted patient twice via Telephone number [phone]    [facility] contacted patient from telephone number [phone]. B[facility] left a  VM.   Patient will need to reschedule appointment by calling Internal medicine center [phone].  Christen Butter, MSW, LCSW-A She/Her Behavioral Health Clinician [facility]  Internal Medicine Center Direct [phone]  Fax [phone] Main Office Phone: [phone] [address] [place] [place], [place] [address] Website: [facility] Internal Medicine [facility]  [place], [place]  

## 2023-01-16 ENCOUNTER — Ambulatory Visit: Payer: Medicaid Other | Admitting: Internal Medicine

## 2023-01-16 ENCOUNTER — Encounter: Payer: Self-pay | Admitting: Internal Medicine

## 2023-01-16 VITALS — BP 129/76 | HR 58 | Temp 97.8°F | Ht 66.0 in | Wt 201.9 lb

## 2023-01-16 DIAGNOSIS — F4321 Adjustment disorder with depressed mood: Secondary | ICD-10-CM

## 2023-01-16 DIAGNOSIS — I10 Essential (primary) hypertension: Secondary | ICD-10-CM | POA: Diagnosis present

## 2023-01-16 DIAGNOSIS — F1190 Opioid use, unspecified, uncomplicated: Secondary | ICD-10-CM | POA: Diagnosis not present

## 2023-01-16 MED ORDER — SERTRALINE HCL 25 MG PO TABS: 25.0000 mg | ORAL_TABLET | Freq: Every day | ORAL | 2 refills | Status: DC

## 2023-01-16 MED ORDER — HYDROCODONE-ACETAMINOPHEN 10-325 MG PO TABS: 1.0000 | ORAL_TABLET | Freq: Two times a day (BID) | ORAL | 0 refills | Status: DC | PRN

## 2023-01-16 MED ORDER — TRAZODONE HCL 50 MG PO TABS: 50.0000 mg | ORAL_TABLET | Freq: Every day | ORAL | 2 refills | Status: DC

## 2023-01-16 NOTE — Progress Notes (Signed)
Subjective:   Patient ID: Christy Burke female   DOB: 1960/09/14 62 y.o.   MRN: [id number]  HPI: [name] is a 62 y.o. female with past medical history outlined below here for follow up of her HTN and grief following the death of her father. For further details of today's visit, please refer to the assessment and plan below.    Past Medical History:  Diagnosis Date   Asthma    History of umbilical hernia    Hypertension    Renal stones    Current Outpatient Medications  Medication Sig Dispense Refill   sertraline (ZOLOFT) 25 MG tablet Take 1 tablet (25 mg total) by mouth daily. 30 tablet 2   traZODone (DESYREL) 50 MG tablet Take 1 tablet (50 mg total) by mouth at bedtime. 30 tablet 2   albuterol (VENTOLIN HFA) 108 (90 Base) MCG/ACT inhaler Inhale 2 puffs into the lungs every 6 (six) hours as needed for wheezing or shortness of breath. 1 each 0   CELEBREX 200 MG capsule Take 200 mg by mouth 2 (two) times daily. (Patient not taking: Reported on 12/18/2022)     gabapentin (NEURONTIN) 300 MG capsule Take 1 capsule by mouth once daily at bedtime 30 capsule 3   hydrochlorothiazide (HYDRODIURIL) 25 MG tablet Take 1 tablet (25 mg total) by mouth daily. 90 tablet 3   HYDROcodone-acetaminophen (NORCO) 10-325 MG tablet Take 1 tablet by mouth 2 (two) times daily as needed. 60 tablet 0   methimazole (TAPAZOLE) 5 MG tablet TAKE 1 TABLET BY MOUTH IN THE MORNING 90 tablet 3   tiZANidine (ZANAFLEX) 2 MG tablet Take 2 tablets (4 mg total) by mouth 2 (two) times daily as needed for muscle spasms. 30 tablet 1   No current facility-administered medications for this visit.   Family History  Problem Relation Age of Onset   Breast cancer Mother    Cancer Mother    Diabetes Father    Colon cancer Brother    Esophageal cancer Neg Hx    Rectal cancer Neg Hx    Stomach cancer Neg Hx    Social History   Socioeconomic History   Marital status: Married    Spouse name: Not on file   Number of  children: Not on file   Years of education: Not on file   Highest education level: Not on file  Occupational History   Not on file  Tobacco Use   Smoking status: Every Day    Current packs/day: 0.50    Average packs/day: 0.5 packs/day for 39.0 years (19.5 ttl pk-yrs)    Types: Cigarettes   Smokeless tobacco: Never   Tobacco comments:    Sometimes less.  Substance and Sexual Activity   Alcohol use: No    Alcohol/week: 0.0 standard drinks of alcohol    Comment: Quit 9 yrs ago.   Drug use: No    Types: Cocaine    Comment: Quit several years ago, never did IVDU only smoked cocaine   Sexual activity: Yes    Birth control/protection: Other-see comments    Comment: In same sex relationship for 19 years  Other Topics Concern   Not on file  Social History Narrative   Not on file   Social Determinants of Health   Financial Resource Strain: Low Risk  (02/07/2022)   Overall Financial Resource Strain (CARDIA)    Difficulty of Paying Living Expenses: Not hard at all  Food Insecurity: No Food Insecurity (02/07/2022)   Hunger Vital Sign  Worried About Programme researcher, broadcasting/film/video in the Last Year: Never true    Ran Out of Food in the Last Year: Never true  Transportation Needs: No Transportation Needs (02/07/2022)   PRAPARE - Administrator, Civil Service (Medical): No    Lack of Transportation (Non-Medical): No  Physical Activity: Not on file  Stress: Not on file  Social Connections: Socially Integrated (02/07/2022)   Social Connection and Isolation Panel [NHANES]    Frequency of Communication with Friends and Family: More than three times a week    Frequency of Social Gatherings with Friends and Family: Twice a week    Attends Religious Services: 1 to 4 times per year    Active Member of [employer] or Organizations: No    Attends Engineer, structural: 1 to 4 times per year    Marital Status: Married     Objective:  Physical Exam:  Vitals:   01/16/23 1043 01/16/23 1124   BP: (!) 141/85 129/76  Pulse: 62 (!) 58  Temp: 97.8 F (36.6 C)   TempSrc: Oral   SpO2: 100%   Weight: 201 lb 14.4 oz (91.6 kg)   Height: 5\' 6"  (1.676 m)     Constitutional: NAD, well appearing, ambulating with a cane Cardiovascular: RRR, no m/r/g Pulmonary/Chest: Clear bilaterally, normal effort Extremities: Right knee with TKA Psychiatric: Tearful, depressed affect   Assessment & Plan:   Chronic, continuous use of opioids I will continue to discuss taper with her at future visits, but today we mainly focused on her acute grief following the death of her father. She is very distraught today and I did a lot of supportive listening. Norco was started for chronic knee pain from OA, she is now s/p TKA but continues to have pain and walks with a walker. I reviewed her PDMP; last tox assure was appropriate. She just filled for this month and I sent an additional refill to her pharmacy. Continue for now.   Complicated grief Patient is having a severe adjustment disorder and grief reaction following the recent death of her father. She is very distraught today and feels like she has no motivation for anything in her life. She is not sleeping and mostly sits around on her couch every day. She is meeting with our clinic counselor Marena Chancy and she would like to schedule an in person appointment which I think is a good idea. At this point she is open to medication treatment as well. I recommended we initiate an SSRI for 3-6 months and assess response. If doing well after this we can attempt to taper. She is also requesting something for sleep. I have sent an Rx for trazodone which should be less risky than Ambien or a benzodiazepine with her chronic use of opioids. I have asked her to follow up with me again in 4-6 weeks.   HTN (hypertension) Chronic and well controlled on hydrochlorothiazide 25 mg daily.

## 2023-01-16 NOTE — Assessment & Plan Note (Signed)
Patient is having a severe adjustment disorder and grief reaction following the recent death of her father. She is very distraught today and feels like she has no motivation for anything in her life. She is not sleeping and mostly sits around on her couch every day. She is meeting with our clinic counselor Marena Chancy and she would like to schedule an in person appointment which I think is a good idea. At this point she is open to medication treatment as well. I recommended we initiate an SSRI for 3-6 months and assess response. If doing well after this we can attempt to taper. She is also requesting something for sleep. I have sent an Rx for trazodone which should be less risky than Ambien or a benzodiazepine with her chronic use of opioids. I have asked her to follow up with me again in 4-6 weeks.

## 2023-01-16 NOTE — Assessment & Plan Note (Signed)
Chronic and well controlled on hydrochlorothiazide 25 mg daily.

## 2023-01-16 NOTE — Patient Instructions (Signed)
Christy Burke,  It was a pleasure to see you. I am so sorry to hear about the passing of your father. Please schedule an appointment with Marena Chancy at your convenience.   I have started you on a medicine called sertraline (aka zoloft) for your mood. Please take this once a day for the next few weeks to see if this improves your mood. I have also sent a prescription for trazodone for sleep. You can take this as needed before bed.   Follow up again with me in 4-6 weeks.   If you have any questions or concerns, call our clinic at [phone] or after hours call [phone] and ask for the internal medicine resident on call.   Thank you!  Dr. Reece Agar

## 2023-01-16 NOTE — Assessment & Plan Note (Signed)
I will continue to discuss taper with her at future visits, but today we mainly focused on her acute grief following the death of her father. She is very distraught today and I did a lot of supportive listening. Norco was started for chronic knee pain from OA, she is now s/p TKA but continues to have pain and walks with a walker. I reviewed her PDMP; last tox assure was appropriate. She just filled for this month and I sent an additional refill to her pharmacy. Continue for now.

## 2023-01-17 ENCOUNTER — Telehealth: Payer: Self-pay | Admitting: Internal Medicine

## 2023-01-17 NOTE — Telephone Encounter (Signed)
Inbound call from patient wishing to rescheduling colonoscopy for 12/12. Patient is requesting a call to receive updated prep instructions. Please advise, thank you.

## 2023-01-17 NOTE — Telephone Encounter (Signed)
Updated instructions sent to MyChart as requested by patient;  a printed copy also mailed to patient;

## 2023-01-24 ENCOUNTER — Encounter: Payer: Self-pay | Admitting: Internal Medicine

## 2023-01-28 ENCOUNTER — Ambulatory Visit: Payer: Medicaid Other | Admitting: Licensed Clinical Social Worker

## 2023-02-14 ENCOUNTER — Ambulatory Visit (AMBULATORY_SURGERY_CENTER): Payer: Self-pay | Admitting: Internal Medicine

## 2023-02-14 ENCOUNTER — Encounter: Payer: Self-pay | Admitting: Internal Medicine

## 2023-02-14 VITALS — BP 147/95 | HR 59 | Temp 98.2°F | Resp 24 | Ht 66.0 in | Wt 197.0 lb

## 2023-02-14 DIAGNOSIS — K5730 Diverticulosis of large intestine without perforation or abscess without bleeding: Secondary | ICD-10-CM

## 2023-02-14 DIAGNOSIS — Z1211 Encounter for screening for malignant neoplasm of colon: Secondary | ICD-10-CM | POA: Diagnosis not present

## 2023-02-14 DIAGNOSIS — J45909 Unspecified asthma, uncomplicated: Secondary | ICD-10-CM | POA: Diagnosis not present

## 2023-02-14 DIAGNOSIS — D122 Benign neoplasm of ascending colon: Secondary | ICD-10-CM

## 2023-02-14 DIAGNOSIS — I10 Essential (primary) hypertension: Secondary | ICD-10-CM | POA: Diagnosis not present

## 2023-02-14 MED ORDER — SODIUM CHLORIDE 0.9 % IV SOLN: 500.0000 mL | INTRAVENOUS | Status: DC

## 2023-02-14 NOTE — Op Note (Signed)
[facility] Patient Name: Christy Burke Procedure Date: 02/14/2023 12:40 PM MRN: [id number] Endoscopist: Wilhemina Bonito. Marina Goodell , MD, [id number] Age: 62 Referring MD:  Date of Birth: 1960/06/18 Gender: Female Account #: [id number] Procedure:                Colonoscopy with cold snare polypectomy x 1 Indications:              Screening for colorectal malignant neoplasm. Prior                            exam 2014 was negative for neoplasia Medicines:                Monitored Anesthesia Care Procedure:                Pre-Anesthesia Assessment:                           - Prior to the procedure, a History and Physical                            was performed, and patient medications and                            allergies were reviewed. The patient's tolerance of                            previous anesthesia was also reviewed. The risks                            and benefits of the procedure and the sedation                            options and risks were discussed with the patient.                            All questions were answered, and informed consent                            was obtained. Prior Anticoagulants: The patient has                            taken no anticoagulant or antiplatelet agents. ASA                            Grade Assessment: II - A patient with mild systemic                            disease. After reviewing the risks and benefits,                            the patient was deemed in satisfactory condition to                            undergo the procedure.  After obtaining informed consent, the colonoscope                            was passed under direct vision. Throughout the                            procedure, the patient's blood pressure, pulse, and                            oxygen saturations were monitored continuously. The                            CF [id number] #[id number] was introduced through the anus                             and advanced to the the cecum, identified by                            appendiceal orifice and ileocecal valve. The                            ileocecal valve, appendiceal orifice, and rectum                            were photographed. The quality of the bowel                            preparation was excellent. The colonoscopy was                            performed without difficulty. The patient tolerated                            the procedure well. The bowel preparation used was                            SUPREP via split dose instruction. Scope In: 1:36:54 PM Scope Out: 1:48:43 PM Scope Withdrawal Time: 0 hours 9 minutes 21 seconds  Total Procedure Duration: 0 hours 11 minutes 49 seconds  Findings:                 A 2 mm polyp was found in the ascending colon. The                            polyp was removed with a cold snare. Resection and                            retrieval were complete.                           A few diverticula were found in the ascending colon                            and cecum.  The exam was otherwise without abnormality on                            direct and retroflexion views. Complications:            No immediate complications. Estimated blood loss:                            None. Estimated Blood Loss:     Estimated blood loss: none. Impression:               - One 2 mm polyp in the ascending colon, removed                            with a cold snare. Resected and retrieved.                           - Diverticulosis in the ascending colon and in the                            cecum.                           - The examination was otherwise normal on direct                            and retroflexion views. Recommendation:           - Repeat colonoscopy in 7-10 years for surveillance.                           - Patient has a contact number available for                            emergencies. The  signs and symptoms of potential                            delayed complications were discussed with the                            patient. Return to normal activities tomorrow.                            Written discharge instructions were provided to the                            patient.                           - Resume previous diet.                           - Continue present medications.                           - Await pathology results. Wilhemina Bonito. Marina Goodell, MD 02/14/2023 1:53:59 PM This report has been signed electronically.

## 2023-02-14 NOTE — Progress Notes (Signed)
Report to PACU, RN, vss, BBS= Clear.  

## 2023-02-14 NOTE — Progress Notes (Signed)
Pt's states no medical or surgical changes since previsit or office visit. 

## 2023-02-14 NOTE — Progress Notes (Signed)
HISTORY OF PRESENT ILLNESS:  Christy Burke is a 62 y.o. female who presents today for screening colonoscopy.  Previous examination November 2014 was normal  REVIEW OF SYSTEMS:  All non-GI ROS negative except for  Past Medical History:  Diagnosis Date   Asthma    History of umbilical hernia    Hypertension    Renal stones     Past Surgical History:  Procedure Laterality Date   lithotrypsy     UMBILICAL HERNIA REPAIR  1990    Social History Christy Burke  reports that she has been smoking cigarettes. She has a 19.5 pack-year smoking history. She has never used smokeless tobacco. She reports that she does not drink alcohol and does not use drugs.  family history includes Breast cancer in her mother; Cancer in her mother; Colon cancer in her brother; Diabetes in her father.  No Known Allergies     PHYSICAL EXAMINATION: Vital signs: BP 136/86   Pulse (!) 59   Temp 98.2 F (36.8 C)   Ht 5\' 6"  (1.676 m)   Wt 197 lb (89.4 kg)   LMP 03/14/2015 (Approximate)   SpO2 97%   BMI 31.80 kg/m  General: Well-developed, well-nourished, no acute distress HEENT: Sclerae are anicteric, conjunctiva pink. Oral mucosa intact Lungs: Clear Heart: Regular Abdomen: soft, nontender, nondistended, no obvious ascites, no peritoneal signs, normal bowel sounds. No organomegaly. Extremities: No edema Psychiatric: alert and oriented x3. Cooperative     ASSESSMENT:  Colon cancer screening   PLAN:  Screening colonoscopy

## 2023-02-14 NOTE — Patient Instructions (Addendum)
Resume previous diet Continue present medications Await pathology results  Handouts/information given for polyps diverticulosis   YOU HAD AN ENDOSCOPIC PROCEDURE TODAY AT THE [facility]:   Refer to the procedure report that was given to you for any specific questions about what was found during the examination.  If the procedure report does not answer your questions, please call your gastroenterologist to clarify.  If you requested that your care partner not be given the details of your procedure findings, then the procedure report has been included in a sealed envelope for you to review at your convenience later.  YOU SHOULD EXPECT: Some feelings of bloating in the abdomen. Passage of more gas than usual.  Walking can help get rid of the air that was put into your GI tract during the procedure and reduce the bloating. If you had a lower endoscopy (such as a colonoscopy or flexible sigmoidoscopy) you may notice spotting of blood in your stool or on the toilet paper. If you underwent a bowel prep for your procedure, you may not have a normal bowel movement for a few days.  Please Note:  You might notice some irritation and congestion in your nose or some drainage.  This is from the oxygen used during your procedure.  There is no need for concern and it should clear up in a day or so.  SYMPTOMS TO REPORT IMMEDIATELY:  Following lower endoscopy (colonoscopy):  Excessive amounts of blood in the stool  Significant tenderness or worsening of abdominal pains  Swelling of the abdomen that is new, acute  Fever of 100F or higher  For urgent or emergent issues, a gastroenterologist can be reached at any hour by calling [phone]. Do not use MyChart messaging for urgent concerns.   DIET:  We do recommend a small meal at first, but then you may proceed to your regular diet.  Drink plenty of fluids but you should avoid alcoholic beverages for 24 hours.  ACTIVITY:  You should plan to  take it easy for the rest of today and you should NOT DRIVE or use heavy machinery until tomorrow (because of the sedation medicines used during the test).    FOLLOW UP: Our staff will call the number listed on your records the next business day following your procedure.  We will call around 7:15- 8:00 am to check on you and address any questions or concerns that you may have regarding the information given to you following your procedure. If we do not reach you, we will leave a message.     If any biopsies were taken you will be contacted by phone or by letter within the next 1-3 weeks.  Please call us at [phone] if you have not heard about the biopsies in 3 weeks.   SIGNATURES/CONFIDENTIALITY: You and/or your care partner have signed paperwork which will be entered into your electronic medical record.  These signatures attest to the fact that that the information above on your After Visit Summary has been reviewed and is understood.  Full responsibility of the confidentiality of this discharge information lies with you and/or your care-partner.

## 2023-02-14 NOTE — Progress Notes (Signed)
Called to room to assist during endoscopic procedure.  Patient ID and intended procedure confirmed with present staff. Received instructions for my participation in the procedure from the performing physician.  

## 2023-02-15 ENCOUNTER — Telehealth: Payer: Self-pay

## 2023-02-15 NOTE — Telephone Encounter (Signed)
  Follow up Call-     02/14/2023   12:57 PM  Call back number  Post procedure Call Back phone  # [phone]  Permission to leave phone message Yes     Patient questions:  Do you have a fever, pain , or abdominal swelling? No. Pain Score  0 *  Have you tolerated food without any problems? Yes.    Have you been able to return to your normal activities? Yes.    Do you have any questions about your discharge instructions: Diet   No. Medications  No. Follow up visit  No.  Do you have questions or concerns about your Care? No.  Actions: * If pain score is 4 or above: No action needed, pain <4.

## 2023-02-19 ENCOUNTER — Encounter: Payer: Self-pay | Admitting: Internal Medicine

## 2023-02-19 LAB — SURGICAL PATHOLOGY

## 2023-03-08 ENCOUNTER — Other Ambulatory Visit: Payer: Self-pay

## 2023-03-08 DIAGNOSIS — Z96651 Presence of right artificial knee joint: Secondary | ICD-10-CM

## 2023-03-08 DIAGNOSIS — I10 Essential (primary) hypertension: Secondary | ICD-10-CM

## 2023-03-08 DIAGNOSIS — F1190 Opioid use, unspecified, uncomplicated: Secondary | ICD-10-CM

## 2023-03-08 MED ORDER — GABAPENTIN 300 MG PO CAPS: 300.0000 mg | ORAL_CAPSULE | Freq: Every day | ORAL | 3 refills | Status: DC

## 2023-03-08 MED ORDER — HYDROCODONE-ACETAMINOPHEN 10-325 MG PO TABS: 1.0000 | ORAL_TABLET | Freq: Two times a day (BID) | ORAL | 0 refills | Status: DC | PRN

## 2023-03-08 MED ORDER — HYDROCHLOROTHIAZIDE 25 MG PO TABS: 25.0000 mg | ORAL_TABLET | Freq: Every day | ORAL | 3 refills | Status: DC

## 2023-03-18 ENCOUNTER — Ambulatory Visit (INDEPENDENT_AMBULATORY_CARE_PROVIDER_SITE_OTHER): Payer: Self-pay | Admitting: Internal Medicine

## 2023-03-18 ENCOUNTER — Encounter: Payer: Self-pay | Admitting: Internal Medicine

## 2023-03-18 VITALS — BP 122/64 | HR 69 | Ht 66.0 in | Wt 194.6 lb

## 2023-03-18 DIAGNOSIS — E0500 Thyrotoxicosis with diffuse goiter without thyrotoxic crisis or storm: Secondary | ICD-10-CM

## 2023-03-18 NOTE — Patient Instructions (Addendum)
 Please continue methimazole 5 mg in a.m.  Please stop at the lab.  Please come back for a follow-up appointment in 1 year.

## 2023-03-18 NOTE — Progress Notes (Addendum)
 Patient ID: Kensi Karr, female   DOB: 14-Feb-1961, 63 y.o.   MRN: [id number]   HPI  Jordie Skalsky is a 63 y.o.-year-old female, returning for follow-up for Graves' disease and Graves' ophthalmopathy.  Last visit 1 year ago.  Interim history: No tremors, palpitations, anxiety. She lost her father 3 mo ago >> now on antidepressants, but would like to taper these off as she does not like how she feels on the. She is also planning to start going to the gym.  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 Labs remained abnormal 2 weeks later.  At that time, she was started on: - Methimazole  10 mg 3x a day - Propranolol  10 mg 2x a day  Retrospectively, she did have: - hot flushes (but had a hysterectomy) - L eye bulging - some blurry vision, but not diplopia, chemosis, pain - weight loss: 20 lbs - in last 4 mo - lack of appetite - tremors - palpitations - better on beta blockers - no hyperdefecation - no insomnia - + anxiety  In 03/2017, she was 3 times a day and Inderal  10 mg twice a day.  The symptoms resolved, except for hot flashes, for which she is on HRT.  We subsequently decreased the dose of methimazole  and stopped Inderal .  However, she was not usually returning for last between appointments despite advised to do so. In 07/2018, her TSH was elevated, so I advised her to reduce the methimazole  dose to 5 mg twice a day and come back for labs in 1.5 months.  She did not return...  In 03/2019, we decreased her methimazole  dose further to 5 mg a.m. and 2.5 mg in p.m.  She did not return for labs... In 07/2019 she ran out of methimazole  5 days prior to the appointment... We restarted 5 mg in am and 2.5 mg later in the day. In 11/2019, we decreased methimazole  to 5 mg daily.  Reviewed her TFTs: Lab Results  Component Value Date   TSH 1.90 04/06/2022   TSH 1.33 03/17/2021   TSH  1.31 09/12/2020   TSH 2.73 03/18/2020   TSH 2.35 11/13/2019   TSH 0.91 07/10/2019   TSH 2.66 03/11/2019   TSH 1.32 11/05/2018   TSH 11.77 (H) 07/16/2018   TSH 0.59 11/25/2017   FREET4 0.66 04/06/2022   FREET4 0.80 03/17/2021   FREET4 0.69 09/12/2020   FREET4 0.66 03/18/2020   FREET4 0.69 11/13/2019   FREET4 0.80 07/10/2019   FREET4 0.65 03/11/2019   FREET4 0.77 11/05/2018   FREET4 0.50 (L) 07/16/2018   FREET4 0.63 11/25/2017   T3FREE 2.9 04/06/2022   T3FREE 3.9 03/17/2021   T3FREE 3.6 09/12/2020   T3FREE 3.1 03/18/2020   T3FREE 3.5 11/13/2019   T3FREE 3.1 07/10/2019   T3FREE 2.9 03/11/2019   T3FREE 3.0 11/05/2018   T3FREE 2.9 07/16/2018   T3FREE 3.0 11/25/2017   Her Graves' antibodies were elevated: Lab Results  Component Value Date   TSI 257 (H) 04/06/2022   TSI 222 (H) 03/17/2021   TSI 236 (H) 03/11/2019   TSI 319 (H) 07/26/2017   Component     Latest Ref Rng & Units 04/15/2017  Thyrotropin Receptor Ab     0.00 - 1.75 IU/L 12.16 (H)   Pt denies: - feeling nodules in neck - hoarseness - dysphagia - choking  Pt does have a FH of thyroid  ds.:  2nd cousin and niece with Graves ds. No FH of thyroid  cancer. No h/o radiation tx to head or neck. No recent contrast studies. No herbal supplements. No Biotin use.   Pt. also has a history of kidney stones in 2012, 2014, 2016, 2018.  She has a history of lithotripsy.  She has no double vision, chemosis, or eye pain.  She follows with ophthalmology every year. She saw her eye dr. At [facility] >> small cataract. She has itchy eyes.  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  Wears eye protectors and mask. She had TAH several years ago. She developed hot flushes after this.  ROS: + see HPI  I reviewed pt's medications, allergies, PMH, social hx, family hx, and changes were documented in the history of present illness. Otherwise, unchanged from my initial visit note.  Past Medical History:   Diagnosis Date   Asthma    History of umbilical hernia    Hypertension    Renal stones    Past Surgical History:  Procedure Laterality Date   lithotrypsy     UMBILICAL HERNIA REPAIR  1990   Social History   Socioeconomic History   Marital status: Married    Spouse name: Not on file   Number of children: 12: 27 year old in 2019   Years of education: Not on file   Highest education level: Not on file  Occupational History    Musician  Social Needs   Financial resource strain: Not on file   Food insecurity:    Worry: Not on file    Inability: Not on file   Transportation needs:    Medical: Not on file    Non-medical: Not on file  Tobacco Use   Smoking status: Current Every Day Smoker    Packs/day: 1.00    Years: 39.00    Pack years: 39.00    Types: Cigarettes   Smokeless tobacco: Never Used   Tobacco comment: wants Nicotine  gum. 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 1 each 0   CELEBREX 200 MG capsule Take 200 mg by mouth 2 (two) times daily. (Patient not taking: Reported on 12/18/2022)     gabapentin  (NEURONTIN ) 300 MG capsule Take 1 capsule (300 mg total) by mouth at bedtime. 30 capsule 3   hydrochlorothiazide  (HYDRODIURIL ) 25 MG tablet Take 1 tablet (25 mg total) by mouth daily. 90 tablet 3   HYDROcodone -acetaminophen  (NORCO) 10-325 MG tablet Take 1 tablet by mouth 2 (two)  times daily as needed. 60 tablet 0   methimazole  (TAPAZOLE ) 5 MG tablet TAKE 1 TABLET BY MOUTH IN THE MORNING 90 tablet 3   sertraline  (ZOLOFT ) 25 MG tablet Take 1 tablet (25 mg total) by mouth daily. 30 tablet 2   tiZANidine  (ZANAFLEX ) 2 MG tablet Take 2 tablets (4  mg total) by mouth 2 (two) times daily as needed for muscle spasms. 30 tablet 1   traZODone  (DESYREL ) 50 MG tablet Take 1 tablet (50 mg total) by mouth at bedtime. 30 tablet 2   No current facility-administered medications on file prior to visit.   No Known Allergies Family History  Problem Relation Age of Onset   Breast cancer Mother    Cancer Mother    Diabetes Father    Colon cancer Brother    Esophageal cancer Neg Hx    Rectal cancer Neg Hx    Stomach cancer Neg Hx    PE: BP 122/64   Pulse 69   Ht 5' 6 (1.676 m)   Wt 194 lb 9.6 oz (88.3 kg)   LMP 03/14/2015 (Approximate)   SpO2 96%   BMI 31.41 kg/m  Wt Readings from Last 10 Encounters:  03/18/23 194 lb 9.6 oz (88.3 kg)  02/14/23 197 lb (89.4 kg)  01/16/23 201 lb 14.4 oz (91.6 kg)  12/18/22 197 lb (89.4 kg)  10/17/22 194 lb (88 kg)  08/08/22 194 lb 11.2 oz (88.3 kg)  03/16/22 198 lb 6.4 oz (90 kg)  02/07/22 196 lb 12.8 oz (89.3 kg)  11/01/21 194 lb (88 kg)  10/04/21 192 lb 12.8 oz (87.5 kg)   Constitutional: overweight, in NAD, walks with cane Eyes: EOMI, no exophthalmos ENT: + symmetric, nonnodular, thyromegaly, no cervical lymphadenopathy Cardiovascular: RRR, No MRG Respiratory: CTA B Musculoskeletal: no deformities Skin: no rashes Neurological: no tremor with outstretched hands  ASSESSMENT: 1. Graves ds.   2.  Graves' ophthalmopathy  PLAN:  1. Patient with history of enlarged thyroid  discovered by patient when she was looking in the mirror in 03/2017.  Around that time, she was also found to have thyrotoxicosis.  Thyrotropin receptor antibodies were elevated, pointing towards Graves' disease.  She was started on methimazole  10 mg daily and  Inderal  10 mg twice a day.  Retrospectively, she noticed weight loss, heat intolerance, palpitations, tremors, increased appetite, anxiety.  Symptoms resolved after starting the above medications.  However, she was not compliant with her thyroid  hormone checks and we could not adjust the methimazole  dose in a timely fashion.  In 03/2019, we decreased the dose of methimazole  and she again did not reason for labs as recommended.  She also ran out of methimazole .  I strongly advised her against these practices after discussion about possible consequences of uncontrolled hypothyroidism.  At last visit she was on 5 mg of methimazole  daily, which we continued as her TFTs were normal. -At today's visit she has no thyrotoxic signs or symptoms.  She has a history of hot flashes developed after TAH surgery, so likely postmenopausal.  No tremors, palpitations, anxiety, insomnia, but had depression related to grief after her father's death.  Since last visit she gained 5 pounds, and she lost 1 since then. Lost 4 lbs since last OV. -At this visit, we will recheck her TFTs and change the methimazole  dose accordingly.  She will need a refill afterwards. -I will see her back in a year, but likely sooner for labs  2.  Graves' ophthalmopathy -Her exophthalmos was slightly improved and it is not visible anymore -She mentions blurry vision but no double vision, eye pain, chemosis.  She has itchy eyes, possibly due to exposure to fumes at her workplace. -Her to antibodies were still elevated at last check  -We will repeat these now -She follows with ophthalmology on a yearly basis  Orders Placed  This Encounter  Procedures   TSH   T4, free   T3, free   Thyroid  stimulating immunoglobulin   Needs refills of MMI.  Component     Latest Ref Rng 03/18/2023  TSH     0.40 - 4.50 mIU/L 1.41   Triiodothyronine,Free,Serum     2.3 - 4.2 pg/mL 3.3   T4,Free(Direct)     0.8 - 1.8 ng/dL 1.0   TSI     <859 % baseline 246 (H)    TFTs are normal and TSI's are still elevated, only slightly better than before.  Will continue the same dose of methimazole  for now.  Lela Fendt, MD PhD [facility] Endocr[facility]

## 2023-03-21 LAB — T3, FREE: T3, Free: 3.3 pg/mL (ref 2.3–4.2)

## 2023-03-21 LAB — THYROID STIMULATING IMMUNOGLOBULIN: TSI: 246 % baseline — ABNORMAL HIGH (ref <140)

## 2023-03-21 LAB — T4, FREE: Free T4: 1 ng/dL (ref 0.8–1.8)

## 2023-03-21 LAB — TSH: TSH: 1.41 m[IU]/L (ref 0.40–4.50)

## 2023-03-22 MED ORDER — METHIMAZOLE 5 MG PO TABS: ORAL_TABLET | ORAL | 3 refills | Status: DC

## 2023-03-22 NOTE — Addendum Note (Signed)
Addended by: Carlus Pavlov on: 03/22/2023 11:39 AM   Modules accepted: Orders

## 2023-03-27 ENCOUNTER — Ambulatory Visit (INDEPENDENT_AMBULATORY_CARE_PROVIDER_SITE_OTHER): Payer: Medicaid Other | Admitting: Internal Medicine

## 2023-03-27 ENCOUNTER — Encounter: Payer: Self-pay | Admitting: Internal Medicine

## 2023-03-27 VITALS — BP 132/83 | HR 70 | Temp 98.1°F | Ht 66.0 in | Wt 202.0 lb

## 2023-03-27 DIAGNOSIS — E0500 Thyrotoxicosis with diffuse goiter without thyrotoxic crisis or storm: Secondary | ICD-10-CM

## 2023-03-27 DIAGNOSIS — F1190 Opioid use, unspecified, uncomplicated: Secondary | ICD-10-CM

## 2023-03-27 DIAGNOSIS — F4321 Adjustment disorder with depressed mood: Secondary | ICD-10-CM

## 2023-03-27 DIAGNOSIS — I10 Essential (primary) hypertension: Secondary | ICD-10-CM

## 2023-03-27 DIAGNOSIS — F324 Major depressive disorder, single episode, in partial remission: Secondary | ICD-10-CM | POA: Diagnosis not present

## 2023-03-27 DIAGNOSIS — M51379 Other intervertebral disc degeneration, lumbosacral region without mention of lumbar back pain or lower extremity pain: Secondary | ICD-10-CM | POA: Diagnosis not present

## 2023-03-27 DIAGNOSIS — Z96651 Presence of right artificial knee joint: Secondary | ICD-10-CM

## 2023-03-27 MED ORDER — HYDROCODONE-ACETAMINOPHEN 10-325 MG PO TABS: 1.0000 | ORAL_TABLET | Freq: Two times a day (BID) | ORAL | 0 refills | Status: DC | PRN

## 2023-03-27 MED ORDER — SERTRALINE HCL 25 MG PO TABS: 25.0000 mg | ORAL_TABLET | Freq: Every day | ORAL | 5 refills | Status: DC

## 2023-03-27 MED ORDER — HYDROCHLOROTHIAZIDE 25 MG PO TABS: 25.0000 mg | ORAL_TABLET | Freq: Every day | ORAL | 3 refills | Status: DC

## 2023-03-27 MED ORDER — GABAPENTIN 300 MG PO CAPS: 300.0000 mg | ORAL_CAPSULE | Freq: Every day | ORAL | 3 refills | Status: DC

## 2023-03-27 NOTE — Assessment & Plan Note (Signed)
On treatment with methimazole, being managed by endocrinology. Recent thyroid studies WNL, although her antibodies have remained positive. She has a stable goiter on exam that has not changed in size and does not cause symptoms. 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

## 2023-03-27 NOTE — Assessment & Plan Note (Signed)
See prior notes; reviewed PDMP. Sent 3 refills to her pharmacy. Encouraged patient to consider taper in near future with next PCP, unfortunately we did not have a chance to attempt this together. She is on a low dose of Norco 10-325 BID and uses appropriately. Low risk for abuse but now that she is s/p TKA advise we try and come off opioid therapy I have discussed this at past visits and she is open to the idea. Plans to taper at her last visit were delayed by the unexpected death of her father and an episode of MDD that we are now treating with SSRI. If mood symptoms continue to improve would address at follow up.

## 2023-03-27 NOTE — Patient Instructions (Addendum)
Christy Burke,  It has been a pleasure taking care of you. Please take care of yourself! I wish you all the best.   Follow up in 3 months with your new PCP.   If you have any questions or concerns, call our clinic at [phone] or after hours call [phone] and ask for the internal medicine resident on call.   Thank you!  Dr. Reece Agar

## 2023-03-27 NOTE — Assessment & Plan Note (Signed)
Doing well with low dose gabapentin at bedtime for low back pain with radiculopathy. Sent refills.

## 2023-03-27 NOTE — Progress Notes (Signed)
Subjective:   Patient ID: Christy Burke female   DOB: [birth year] 63 y.o.   MRN: [id number]  HPI: [name] is a 63 y.o. female with past medical history outlined below here for follow up of depression. For further details of today's visit, please refer to the assessment and plan below.   Past Medical History:  Diagnosis Date   Asthma    History of umbilical hernia    Hypertension    Renal stones    Current Outpatient Medications  Medication Sig Dispense Refill   albuterol (VENTOLIN HFA) 108 (90 Base) MCG/ACT inhaler Inhale 2 puffs into the lungs every 6 (six) hours as needed for wheezing or shortness of breath. 1 each 0   gabapentin (NEURONTIN) 300 MG capsule Take 1 capsule (300 mg total) by mouth at bedtime. 90 capsule 3   hydrochlorothiazide (HYDRODIURIL) 25 MG tablet Take 1 tablet (25 mg total) by mouth daily. 90 tablet 3   HYDROcodone-acetaminophen (NORCO) 10-325 MG tablet Take 1 tablet by mouth 2 (two) times daily as needed. 60 tablet 0   methimazole (TAPAZOLE) 5 MG tablet TAKE 1 TABLET BY MOUTH IN THE MORNING 90 tablet 3   sertraline (ZOLOFT) 25 MG tablet Take 1 tablet (25 mg total) by mouth daily. 30 tablet 5   No current facility-administered medications for this visit.   Family History  Problem Relation Age of Onset   Breast cancer Mother    Cancer Mother    Diabetes Father    Colon cancer Brother    Esophageal cancer Neg Hx    Rectal cancer Neg Hx    Stomach cancer Neg Hx    Social History   Socioeconomic History   Marital status: Married    Spouse name: Not on file   Number of children: Not on file   Years of education: Not on file   Highest education level: Not on file  Occupational History   Not on file  Tobacco Use   Smoking status: Every Day    Current packs/day: 0.50    Average packs/day: 0.5 packs/day for 39.0 years (19.5 ttl pk-yrs)    Types: Cigarettes   Smokeless tobacco: Never   Tobacco comments:    Sometimes less.  Substance and  Sexual Activity   Alcohol use: No    Alcohol/week: 0.0 standard drinks of alcohol    Comment: Quit 9 yrs ago.   Drug use: No    Types: Cocaine    Comment: Quit several years ago, never did IVDU only smoked cocaine   Sexual activity: Yes    Birth control/protection: Other-see comments    Comment: In same sex relationship for 19 years  Other Topics Concern   Not on file  Social History Narrative   Not on file   Social Drivers of Health   Financial Resource Strain: Low Risk  (02/07/2022)   Overall Financial Resource Strain (CARDIA)    Difficulty of Paying Living Expenses: Not hard at all  Food Insecurity: No Food Insecurity (02/07/2022)   Hunger Vital Sign    Worried About Running Out of Food in the Last Year: Never true    Ran Out of Food in the Last Year: Never true  Transportation Needs: No Transportation Needs (02/07/2022)   PRAPARE - Administrator, Civil Service (Medical): No    Lack of Transportation (Non-Medical): No  Physical Activity: Not on file  Stress: Not on file  Social Connections: Socially Integrated (02/07/2022)   Social Connection and Isolation Panel [  NHANES]    Frequency of Communication with Friends and Family: More than three times a week    Frequency of Social Gatherings with Friends and Family: Twice a week    Attends Religious Services: 1 to 4 times per year    Active Member of [employer] or Organizations: No    Attends Engineer, structural: 1 to 4 times per year    Marital Status: Married     Objective:  Physical Exam:  Vitals:   03/27/23 1039  BP: 132/83  Pulse: 70  Temp: 98.1 F (36.7 C)  SpO2: 100%  Weight: 202 lb (91.6 kg)  Height: 5\' 6"  (1.676 m)    Constitutional: NAD, well appearing Neck: Goiter  Cardiovascular: RRR, no m/r/g Pulmonary/Chest: Clear, normal effort Psychiatric: Normal mood and affect   Assessment & Plan:   Chronic, continuous use of opioids See prior notes; reviewed PDMP. Sent 3 refills to her  pharmacy. Encouraged patient to consider taper in near future with next PCP, unfortunately we did not have a chance to attempt this together. She is on a low dose of Norco 10-325 BID and uses appropriately. Low risk for abuse but now that she is s/p TKA advise we try and come off opioid therapy I have discussed this at past visits and she is open to the idea. Plans to taper at her last visit were delayed by the unexpected death of her father and an episode of MDD that we are now treating with SSRI. If mood symptoms continue to improve would address at follow up.    HTN (hypertension) Well controlled on hydrochlorothiazide. Rechecking BMP today, previously WNL.   Graves disease On treatment with methimazole, being managed by endocrinology. Recent thyroid studies WNL, although her antibodies have remained positive. She has a stable goiter on exam that has not changed in size and does not cause symptoms. 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   Major depression single episode, in partial remission (HCC) Mood symptoms much improved on zoloft. She recently returned to school and is studying Chief of Staff. Still grieving the loss of her father at times which is to be expected. But overall doing better. Feels that she has some negative side effects from zoloft, mostly slower cognitive speed, but given the overall improvement is she agreeable to continue with treatment for now with plans to taper off and discontinue after 6 months.    Degenerative disc disease at L5-S1 level Doing well with low dose gabapentin at bedtime for low back pain with radiculopathy. Sent refills.

## 2023-03-27 NOTE — Assessment & Plan Note (Signed)
Well controlled on hydrochlorothiazide. Rechecking BMP today, previously WNL.

## 2023-03-27 NOTE — Assessment & Plan Note (Signed)
Mood symptoms much improved on zoloft. She recently returned to school and is studying Chief of Staff. Still grieving the loss of her father at times which is to be expected. But overall doing better. Feels that she has some negative side effects from zoloft, mostly slower cognitive speed, but given the overall improvement is she agreeable to continue with treatment for now with plans to taper off and discontinue after 6 months.

## 2023-03-28 ENCOUNTER — Encounter: Payer: Self-pay | Admitting: Internal Medicine

## 2023-03-28 LAB — BMP8+ANION GAP
Anion Gap: 14 mmol/L (ref 10.0–18.0)
BUN/Creatinine Ratio: 12 (ref 12–28)
BUN: 9 mg/dL (ref 8–27)
CO2: 27 mmol/L (ref 20–29)
Calcium: 10 mg/dL (ref 8.7–10.3)
Chloride: 100 mmol/L (ref 96–106)
Creatinine, Ser: 0.75 mg/dL (ref 0.57–1.00)
Glucose: 76 mg/dL (ref 70–99)
Potassium: 3.9 mmol/L (ref 3.5–5.2)
Sodium: 141 mmol/L (ref 134–144)
eGFR: 91 mL/min/{1.73_m2} (ref >59)

## 2023-05-01 DIAGNOSIS — H5213 Myopia, bilateral: Secondary | ICD-10-CM | POA: Diagnosis not present

## 2023-05-14 ENCOUNTER — Other Ambulatory Visit: Payer: Self-pay | Admitting: Internal Medicine

## 2023-05-14 DIAGNOSIS — Z1231 Encounter for screening mammogram for malignant neoplasm of breast: Secondary | ICD-10-CM

## 2023-06-10 ENCOUNTER — Ambulatory Visit
Admission: RE | Admit: 2023-06-10 | Discharge: 2023-06-10 | Disposition: A | Discharge status: Home / Self Care | Source: Ambulatory Visit | Attending: Internal Medicine | Admitting: Internal Medicine

## 2023-06-10 DIAGNOSIS — Z1231 Encounter for screening mammogram for malignant neoplasm of breast: Secondary | ICD-10-CM

## 2023-06-11 ENCOUNTER — Other Ambulatory Visit: Payer: Self-pay | Admitting: Internal Medicine

## 2023-06-11 DIAGNOSIS — F1190 Opioid use, unspecified, uncomplicated: Secondary | ICD-10-CM

## 2023-06-11 DIAGNOSIS — I10 Essential (primary) hypertension: Secondary | ICD-10-CM

## 2023-06-11 MED ORDER — HYDROCODONE-ACETAMINOPHEN 10-325 MG PO TABS
1.0000 | ORAL_TABLET | Freq: Two times a day (BID) | ORAL | 0 refills | Status: DC | PRN
Start: 2023-06-11 — End: 2023-07-31

## 2023-06-11 NOTE — Telephone Encounter (Signed)
 Copied from CRM [id number]. Topic: Clinical - Medication Refill >> Jun 11, 2023  9:11 AM Prudencio Pair wrote: Most Recent Primary Care Visit:  Provider: Reymundo Poll  Department: [facility]  Visit Type: OPEN ESTABLISHED  Date: 03/27/2023  Medication: HYDROcodone-acetaminophen (NORCO) 10-325 MG tablet  Has the patient contacted their pharmacy? No (Agent: If no, request that the patient contact the pharmacy for the refill. If patient does not wish to contact the pharmacy document the reason why and proceed with request.) (Agent: If yes, when and what did the pharmacy advise?)  Is this the correct pharmacy for this prescription? Yes If no, delete pharmacy and type the correct one.  This is the patient's preferred pharmacy:  [facility] [address] ([address]), [place] - [address] [place] ([place]) [place] [address] Phone: [phone] Fax: [phone]  [place] [facility] [address] [place] [address] Phone: [phone] Fax: [phone]  [facility] - [facility] [address] [place] [address] Phone: [phone] Fax: [phone]   Has the prescription been filled recently? Yes  Is the patient out of the medication? Yes  Has the patient been seen for an appointment in the last year OR does the patient have an upcoming appointment? Yes  Can we respond through MyChart? Yes  Agent: Please be advised that Rx refills may take up to 3 business days. We ask that you follow-up with your pharmacy.

## 2023-06-11 NOTE — Telephone Encounter (Signed)
 Last rx written - 03/27/23. Last OV - 03/27/23. TOX - 11/01/21.

## 2023-06-20 ENCOUNTER — Telehealth: Payer: Self-pay | Admitting: Internal Medicine

## 2023-06-20 NOTE — Telephone Encounter (Signed)
 Patient called and is requesting to get a date of when she was diagnosed with Thyroid Disease   Call Back #: [phone] (M)

## 2023-06-20 NOTE — Telephone Encounter (Signed)
 Called pt back and gave her the date of when she was Dx with Thyroid diease.

## 2023-07-31 ENCOUNTER — Telehealth: Payer: Self-pay | Admitting: *Deleted

## 2023-07-31 DIAGNOSIS — F1190 Opioid use, unspecified, uncomplicated: Secondary | ICD-10-CM

## 2023-07-31 MED ORDER — HYDROCODONE-ACETAMINOPHEN 10-325 MG PO TABS
1.0000 | ORAL_TABLET | Freq: Two times a day (BID) | ORAL | 0 refills | Status: DC | PRN
Start: 2023-08-09 — End: 2023-09-02

## 2023-07-31 NOTE — Telephone Encounter (Signed)
 Reviewed PDMP last fill 07/09/23 refill sent for 08/09/23 fill date

## 2023-07-31 NOTE — Telephone Encounter (Signed)
 Will forward to [facility] Attending Pool. Copied from CRM [id number]. Topic: Clinical - Prescription Issue >> Jul 31, 2023 10:36 AM Brynn Caras wrote: Reason for CRM: The patient is requesting a refill of HYDROcodone -acetaminophen  (NORCO) 10-325 MG tablet to be sent to [facility] - [address], [place]. The patient's provider is no longer at the [facility], the patient would like to determine who the authorizing provider will be? Callback [phone]

## 2023-08-08 ENCOUNTER — Telehealth: Payer: Self-pay

## 2023-08-08 ENCOUNTER — Telehealth: Payer: Self-pay | Admitting: *Deleted

## 2023-08-08 NOTE — Telephone Encounter (Signed)
 Prior Authorization for patient (HYDROcodone -Acetaminophen  10-325MG  tablets) came through on cover my meds was submitted with last office notes awaiting approval or denial.  EXB:[id number]

## 2023-08-08 NOTE — Telephone Encounter (Signed)
 Christy Burke ([name]) PA Case ID #: [id number] Need Help? Call us  at [phone] Outcome Approved today by [facility] 2017 NCPDP Request Reference Number: [id number]. HYDROCO/APAP TAB 10-325MG  is approved through 02/07/2024. For further questions, call [employer] at [phone]. Effective Date: 08/08/2023 Authorization Expiration Date: 02/07/2024 Drug HYDROcodone -Acetaminophen  10-325MG  tablets ePA cloud logo Form [employer] Electronic Prior Authorization Form (2017 NCPDP)  Lvm for patient regarding the approval.

## 2023-08-08 NOTE — Telephone Encounter (Signed)
 Copied from CRM [id number]. Topic: Clinical - Medication Prior Auth >> Aug 08, 2023  9:22 AM Shelby Dessert H wrote: Reason for CRM: Patient called and stated that the pharmacy [facility] - [address], [place]. told her she needs a prior auth for her medicine HYDROcodone -acetaminophen  (NORCO) 10-325 MG tablet.   Callback number is [phone] patient

## 2023-08-08 NOTE — Telephone Encounter (Signed)
 Prior Authorization has been submitted awaiting approval or denial. Please see my note from today 08/08/23.

## 2023-09-02 ENCOUNTER — Other Ambulatory Visit: Payer: Self-pay

## 2023-09-02 DIAGNOSIS — I10 Essential (primary) hypertension: Secondary | ICD-10-CM

## 2023-09-02 DIAGNOSIS — F1190 Opioid use, unspecified, uncomplicated: Secondary | ICD-10-CM

## 2023-09-02 MED ORDER — HYDROCHLOROTHIAZIDE 25 MG PO TABS: 25.0000 mg | ORAL_TABLET | Freq: Every day | ORAL | 3 refills | Status: DC

## 2023-09-02 NOTE — Telephone Encounter (Unsigned)
 Copied from CRM [id number]. Topic: Clinical - Medication Refill >> Sep 02, 2023 12:08 PM Mercer PEDLAR wrote: Medication: HYDROcodone -acetaminophen  (NORCO) 10-325 MG tablet hydrochlorothiazide  (HYDRODIURIL ) 25 MG tablet   Has the patient contacted their pharmacy? Yes (Agent: If no, request that the patient contact the pharmacy for the refill. If patient does not wish to contact the pharmacy document the reason why and proceed with request.) (Agent: If yes, when and what did the pharmacy advise?)  This is the patient's preferred pharmacy:  [facility] [address] ([address] [place] - [address] [place] ([place]) [place] [address] Phone: [phone] Fax: [phone]  Is this the correct pharmacy for this prescription? Yes If no, delete pharmacy and type the correct one.   Has the prescription been filled recently? No  Is the patient out of the medication? No  Has the patient been seen for an appointment in the last year OR does the patient have an upcoming appointment? Yes  Can we respond through MyChart? Yes  Agent: Please be advised that Rx refills may take up to 3 business days. We ask that you follow-up with your pharmacy.

## 2023-09-03 MED ORDER — HYDROCODONE-ACETAMINOPHEN 10-325 MG PO TABS
1.0000 | ORAL_TABLET | Freq: Two times a day (BID) | ORAL | 0 refills | Status: DC | PRN
Start: 2023-09-03 — End: 2023-10-01

## 2023-09-17 ENCOUNTER — Ambulatory Visit: Payer: Self-pay

## 2023-09-17 ENCOUNTER — Ambulatory Visit

## 2023-09-17 VITALS — BP 143/96 | HR 65 | Ht 66.0 in | Wt 208.0 lb

## 2023-09-17 DIAGNOSIS — M51379 Other intervertebral disc degeneration, lumbosacral region without mention of lumbar back pain or lower extremity pain: Secondary | ICD-10-CM

## 2023-09-17 DIAGNOSIS — E0500 Thyrotoxicosis with diffuse goiter without thyrotoxic crisis or storm: Secondary | ICD-10-CM | POA: Diagnosis not present

## 2023-09-17 DIAGNOSIS — Z96651 Presence of right artificial knee joint: Secondary | ICD-10-CM | POA: Diagnosis not present

## 2023-09-17 DIAGNOSIS — I10 Essential (primary) hypertension: Secondary | ICD-10-CM | POA: Diagnosis present

## 2023-09-17 DIAGNOSIS — R7303 Prediabetes: Secondary | ICD-10-CM

## 2023-09-17 DIAGNOSIS — F324 Major depressive disorder, single episode, in partial remission: Secondary | ICD-10-CM

## 2023-09-17 DIAGNOSIS — Z23 Encounter for immunization: Secondary | ICD-10-CM

## 2023-09-17 DIAGNOSIS — F1190 Opioid use, unspecified, uncomplicated: Secondary | ICD-10-CM

## 2023-09-17 DIAGNOSIS — Z0000 Encounter for general adult medical examination without abnormal findings: Secondary | ICD-10-CM

## 2023-09-17 DIAGNOSIS — F17210 Nicotine dependence, cigarettes, uncomplicated: Secondary | ICD-10-CM | POA: Diagnosis not present

## 2023-09-17 DIAGNOSIS — Z716 Tobacco abuse counseling: Secondary | ICD-10-CM

## 2023-09-17 LAB — GLUCOSE, CAPILLARY: Glucose-Capillary: 89 mg/dL (ref 70–99)

## 2023-09-17 LAB — POCT GLYCOSYLATED HEMOGLOBIN (HGB A1C): Hemoglobin A1C: 6 % — AB (ref 4.0–5.6)

## 2023-09-17 NOTE — Assessment & Plan Note (Signed)
 Tdap today

## 2023-09-17 NOTE — Assessment & Plan Note (Signed)
 Prior PCP Dr. Forest wanted to try tapering patient off of Norco 10-325 mg, although was unable to at the visit with her. Patient has been taking Norco 10-325 mg BID which she uses appropriately. We will obtain ToxAssure today. She has been taking this for her low back pain and right knee, but given that she is s/p TKA in 2023, we discussed that she should follow up with Ortho if she continues to have knee issues, which she agreed to. We discussed the idea of tapering, and patient seems open to the idea. I asked her to see how she feels if she takes the Norco at bedtime only for now. We will check in on this at her follow up in about a month. - Discuss current use of Norco, and if she was able to back down to Norco only at bedtime - Continue Norco 10-325 mg BID (try taking at night only) - f/u ToxAssure - she denied the need for refill today

## 2023-09-17 NOTE — Progress Notes (Signed)
 Established Patient Office Visit  Subjective   Patient ID: Christy Burke, female    DOB: 1960-10-26  Age: 63 y.o. MRN: [id number]  Chief Complaint  Patient presents with   Medical Management of Chronic Issues   Knee Pain   Christy Burke is a 63 yr old female with a history of Grave's disease (on methimazole , managed by Endo), HTN (on hydrochlorothiazide ), pre-diabetes, hx of latent TB, asthma, and s/p right TKA (01/2022) who presents to clinic for 6 month follow-up to discuss MDD episode and possible tapering of Zoloft .  Patient last saw Dr. Guilloud in the [facility] clinic 03/2023 for recheck of her HTN, continuous use of opioids following TKA in 01/2022, lumbar DDD, and episode of MDD.   In terms of her mood, she reports that it has been relatively stable, though she is tearful during discussion about her father. Her mood recently was a little worse, as it was her father's birthday earlier this week. She reports some guilt associated with moving down to [place] where her wife's family is located, from [place] where her family is located because she considers how she was unable to spend more days with her father. She self-discontinued the Zoloft  a little over a month ago, and does not wish to continue medication therapy for her mood. She also reports that she used to go to the gym often, but since her father's death she has been less interested in doing so. She has tried CBT in the past, though it fell off and she can't remember why she stopped.   Her low back pain with radiculopathy is unchanged. She takes Norco 10-325 mg BID which manages the pain especially at night very well. She underwent right TKA in 01/2022 which was doing well overall, but recently she noticed some occasional popping with mild pain. She has not been back to see Ortho for this yet.   Graves disease currently managed by Endocrinology, with patient on methimazole . Her TSH in 03/2023 was wnl, though her antibodies remained positive. She is  scheduled for recheck with them in January 2026. She denies any symptoms of weight loss, heat intolerance, anxiety, muscle weakness, tremor, palpitations or racing heart beat.   She currently smokes about 0.5 PPD of cigarettes. She has never fully tried quitting, though she states she is interested. She did stop smoking cigarettes and switched to vaping for about a month and a half, though did not like the feeling in her chest with vaping and went back to smoking cigarettes.    ROS: Denies headaches, dizziness, fever, chills, runny nose, sore throat, vision changes, hearing changes, chest pain, shortness of breath, difficulty breathing, nausea, vomiting, abdominal pain. Denies increased urinary frequency, pain with urination, constipation or diarrhea. No recent falls. Reports some low mood following the death of her father.      Objective:     BP (!) 143/96 (BP Location: Right Arm, Patient Position: Sitting, Cuff Size: Normal)   Pulse 65   Ht 5' 6 (1.676 m)   Wt 208 lb (94.3 kg)   LMP 03/14/2015 (Approximate)   SpO2 96%   BMI 33.57 kg/m  BP Readings from Last 3 Encounters:  09/17/23 (!) 143/96  03/27/23 132/83  03/18/23 122/64   Wt Readings from Last 3 Encounters:  09/17/23 208 lb (94.3 kg)  03/27/23 202 lb (91.6 kg)  03/18/23 194 lb 9.6 oz (88.3 kg)      Physical Exam:   Constitutional: well-appearing female sitting in exam chair, in no acute  distress. Ambulates with use of cane assistance device  HEENT: normocephalic atraumatic, mucous membranes moist Eyes: conjunctiva non-erythematous Neck: supple, mild goiter as seen on previous exam Cardiovascular: regular rate and rhythm, bilateral radial pulses 2+, bilateral dorsal pedal pulses 2+, brisk capillary refill bilateral hands  Pulmonary/Chest: normal work of breathing on room air, lungs clear to auscultation bilaterally Abdominal: soft, non-tender, non-distended MSK: normal bulk and tone. Neurological: alert & oriented x  3 Skin: warm and dry, no ulcers or lesions on bilateral feet, healed right TKA knee scar Psych: mood calm, tearful during parts of encounter when discussing her father, behavior normal, thought content normal, judgement normal    Results for orders placed or performed in visit on 09/17/23  Glucose, capillary  Result Value Ref Range   Glucose-Capillary 89 70 - 99 mg/dL  POC Hbg J8R  Result Value Ref Range   Hemoglobin A1C 6.0 (A) 4.0 - 5.6 %   HbA1c POC (<> result, manual entry)     HbA1c, POC (prediabetic range)     HbA1c, POC (controlled diabetic range)      Last hemoglobin A1c Lab Results  Component Value Date   HGBA1C 6.0 (A) 09/17/2023   Last thyroid  functions Lab Results  Component Value Date   TSH 1.41 03/18/2023      The ASCVD Risk score (Arnett DK, et al., 2019) failed to calculate for the following reasons:   Cannot find a previous HDL lab   Cannot find a previous total cholesterol lab    Assessment & Plan:   Problem List Items Addressed This Visit       Cardiovascular and Mediastinum   HTN (hypertension) (Chronic)   Has been well-controlled on hydrochlorothiazide . Today BP a little elevated. Will plan to recheck BP and BMP at next visit.  - Continue hydrochlorothiazide  25 mg daily - BP recheck and BMP at next visit         Endocrine   Graves disease (Chronic)   History of Graves disease and prior Graves ophthalmopathy. Currently on Methimazole  which is managed by Endo. She was last seen by them in 03/2023 where thyroid  studies were wnl, but antibodies remained positive. She does not report any symptoms of weight loss, heat intolerance, anxiety, muscle weakness, tremor, palpitations or racing heart beat. Her goiter has remained stable. She will check with Endo and make sure f/u in Jan 2026 is okay given her recent weight gain. Though I suspect this is more so due to her mood rather than changes in her thyroid  condition.         Musculoskeletal and  Integument   Degenerative disc disease at L5-S1 level   Patient continues to do well with Gabapentin  300 mg daily at bedtime for low back pain with radiculopathy. She states that she has been sleeping well recently.  - Continue gabapentin  300 mg daily         Other   Encounter for smoking cessation counseling (Chronic)   Patient currently smoking about 0.5 PPD of cigarettes. Recently she did stop smoking cigarettes and was instead vaping. She discontinued the vape and returned to cigarettes because it caused discomfort in her throat and chest. She has not tried to discontinue cigarettes on her own previously, but she voices a desire to quit. She has previously tried Buproprion over 2 years ago, but was taking it intermittently and still smoking at that time. We briefly discussed short-acting and long-acting options, but decided to see her back in the office in about  4-6 weeks to discuss smoking cessation further. She will need to be provided with her options at that time, and determine if her mood is in a place to where she feels ready to quit smoking.  - Return to clinic in 4-6 weeks for tobacco cessation counseling       Pre-diabetes - Primary (Chronic)   A1C repeat today at 6.0 and remains in pre-diabetic range. She would like to get back into the gym and increase her activity, which she states she used to be very active but since her father's death has not felt like doing so. If A1C repeat in 6 months remains in prediabetic range, discuss Metformin treatment.  - repeat A1C in 6 months       Relevant Orders   POC Hbg A1C (Completed)   Preventative health care   - Tdap today      History of total knee replacement, right   Patient with a history of right TKA in 01/2022. She reports that she was doing pretty well, but recently noticed some popping with mild pain in her knee. She has not been back to see her Orthopedist, so we discussed that she should contact them for an appointment to  ensure she is proceeding well and does not need additional PT.  - Patient to contact her Ortho for an appt.       Chronic, continuous use of opioids   Prior PCP Dr. Forest wanted to try tapering patient off of Norco 10-325 mg, although was unable to at the visit with her. Patient has been taking Norco 10-325 mg BID which she uses appropriately. We will obtain ToxAssure today. She has been taking this for her low back pain and right knee, but given that she is s/p TKA in 2023, we discussed that she should follow up with Ortho if she continues to have knee issues, which she agreed to. We discussed the idea of tapering, and patient seems open to the idea. I asked her to see how she feels if she takes the Norco at bedtime only for now. We will check in on this at her follow up in about a month. - Discuss current use of Norco, and if she was able to back down to Norco only at bedtime - Continue Norco 10-325 mg BID (try taking at night only) - f/u ToxAssure - she denied the need for refill today       Relevant Orders   ToxAssure Select,+Antidepr,UR   Major depression single episode, in partial remission [facility])   Patient with MDD following the death of her father. PHQ-9 today 10. Denies SI/HI. She was previously managed with Zoloft  25 mg daily, though she self-discontinued the medication a little over a month ago. She does not wish to continue medication therapy for her mood. We discussed CBT with Renda which she is very agreeable to. She had previously met with her and reported that she enjoyed it, but had fallen off. I think this would be great to begin CBT again, both for her mood, grief, and as she tries to quit smoking.  - Behavioral Health therapy referral to Renda Pontes      Relevant Orders   Ambulatory referral to Integrated Behavioral Health   Other Visit Diagnoses       Encounter for tobacco use cessation counseling         Encounter for immunization       Relevant Orders   Tdap  vaccine greater than or equal to  7yo IM (Completed)       Return in about 6 weeks (around 10/29/2023) for tobacco cessation and mood check . and BP check.    Patient discussed with Dr. Francesco, who also saw and evaluated the patient.  Doyal Miyamoto, MD [facility] Internal [facility]  PGY-1  09/17/23, 4:09 PM

## 2023-09-17 NOTE — Assessment & Plan Note (Signed)
 Has been well-controlled on hydrochlorothiazide . Today BP a little elevated. Will plan to recheck BP and BMP at next visit.  - Continue hydrochlorothiazide  25 mg daily - BP recheck and BMP at next visit

## 2023-09-17 NOTE — Assessment & Plan Note (Signed)
 Patient with a history of right TKA in 01/2022. She reports that she was doing pretty well, but recently noticed some popping with mild pain in her knee. She has not been back to see her Orthopedist, so we discussed that she should contact them for an appointment to ensure she is proceeding well and does not need additional PT.  - Patient to contact her Ortho for an appt.

## 2023-09-17 NOTE — Assessment & Plan Note (Signed)
 Patient currently smoking about 0.5 PPD of cigarettes. Recently she did stop smoking cigarettes and was instead vaping. She discontinued the vape and returned to cigarettes because it caused discomfort in her throat and chest. She has not tried to discontinue cigarettes on her own previously, but she voices a desire to quit. She has previously tried Buproprion over 2 years ago, but was taking it intermittently and still smoking at that time. We briefly discussed short-acting and long-acting options, but decided to see her back in the office in about 4-6 weeks to discuss smoking cessation further. She will need to be provided with her options at that time, and determine if her mood is in a place to where she feels ready to quit smoking.  - Return to clinic in 4-6 weeks for tobacco cessation counseling

## 2023-09-17 NOTE — Patient Instructions (Signed)
 Thank you, [name] for allowing us  to provide your care today. Today we discussed the following:  - We discussed that we will refer you to [facility] for behavioral health therapy sessions - Try taking Norco in the PM only to see how you do in the daytime  - I'll see you back in 4-6 weeks (or one of my partners) to discuss smoking cessation and check in about your mood   I have ordered the following labs for you:  Lab Orders         ToxAssure Select,+Antidepr,UR         Glucose, capillary         POC Hbg A1C       Referrals ordered today:   Referral Orders         Ambulatory referral to Integrated Behavioral Health       I have ordered the following medication/changed the following medications:   Stop the following medications: Medications Discontinued During This Encounter  Medication Reason   sertraline  (ZOLOFT ) 25 MG tablet Patient has not taken in last 30 days     Follow up: 4-6 weeks     Should you have any questions or concerns please call the Internal Medicine Clinic at [phone].     Christy Miyamoto, MD [facility]

## 2023-09-17 NOTE — Assessment & Plan Note (Signed)
 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 PHQ-9 today 10. Denies SI/HI. She was previously managed with Zoloft  25 mg daily, though she self-discontinued the medication a little over a month ago. She does not wish to continue medication therapy for her mood. We discussed CBT with Renda which she is very agreeable to. She had previously met with her and reported that she enjoyed it, but had fallen off. I think this would be great to begin CBT again, both for her mood, grief, and as she tries to quit smoking.  - Behavioral Health therapy referral to [facility]

## 2023-09-17 NOTE — Assessment & Plan Note (Signed)
 A1C repeat today at 6.0 and remains in pre-diabetic range. She would like to get back into the gym and increase her activity, which she states she used to be very active but since her father's death has not felt like doing so. If A1C repeat in 6 months remains in prediabetic range, discuss Metformin treatment.  - repeat A1C in 6 months

## 2023-09-17 NOTE — Assessment & Plan Note (Signed)
 History of Graves disease and prior Graves ophthalmopathy. Currently on Methimazole  which is managed by Endo. She was last seen by them in 03/2023 where thyroid  studies were wnl, but antibodies remained positive. She does not report any symptoms of weight loss, heat intolerance, anxiety, muscle weakness, tremor, palpitations or racing heart beat. Her goiter has remained stable. She will check with Endo and make sure f/u in Jan 2026 is okay given her recent weight gain. Though I suspect this is more so due to her mood rather than changes in her thyroid  condition.

## 2023-09-17 NOTE — Assessment & Plan Note (Signed)
 Patient continues to do well with Gabapentin  300 mg daily at bedtime for low back pain with radiculopathy. She states that she has been sleeping well recently.  - Continue gabapentin  300 mg daily

## 2023-09-19 LAB — TOXASSURE SELECT,+ANTIDEPR,UR

## 2023-10-01 ENCOUNTER — Other Ambulatory Visit: Payer: Self-pay

## 2023-10-01 DIAGNOSIS — F1190 Opioid use, unspecified, uncomplicated: Secondary | ICD-10-CM

## 2023-10-01 NOTE — Telephone Encounter (Unsigned)
 Copied from CRM [id number]. Topic: Clinical - Medication Refill >> Oct 01, 2023  9:25 AM Susanna ORN wrote: Medication: HYDROcodone -acetaminophen  (NORCO) 10-325 MG tablet  Has the patient contacted their pharmacy? No (Agent: If no, request that the patient contact the pharmacy for the refill. If patient does not wish to contact the pharmacy document the reason why and proceed with request.) (Agent: If yes, when and what did the pharmacy advise?)  This is the patient's preferred pharmacy:  [facility] [address] ([address]), [place] - [address] [place] ([place]) [place] [address] Phone: [phone] Fax: [phone]  Is this the correct pharmacy for this prescription? Yes If no, delete pharmacy and type the correct one.   Has the prescription been filled recently? Yes  Is the patient out of the medication? Yes  Has the patient been seen for an appointment in the last year OR does the patient have an upcoming appointment? Yes  Can we respond through MyChart? Yes  Agent: Please be advised that Rx refills may take up to 3 business days. We ask that you follow-up with your pharmacy.

## 2023-10-02 MED ORDER — HYDROCODONE-ACETAMINOPHEN 10-325 MG PO TABS: 1.0000 | ORAL_TABLET | Freq: Two times a day (BID) | ORAL | 0 refills | Status: DC | PRN

## 2023-10-03 NOTE — Progress Notes (Signed)
 Internal Medicine Clinic Attending  I was physically present during the key portions of the resident provided service and participated in the medical decision making of patient's management care. I reviewed pertinent patient test results.  The assessment, diagnosis, and plan were formulated together and I agree with the documentation in the resident's note.  Cherylene Corrente, MD

## 2023-10-15 ENCOUNTER — Encounter: Payer: Self-pay | Admitting: *Deleted

## 2023-10-15 ENCOUNTER — Other Ambulatory Visit: Payer: Self-pay

## 2023-10-15 ENCOUNTER — Ambulatory Visit

## 2023-10-15 ENCOUNTER — Ambulatory Visit: Admitting: Licensed Clinical Social Worker

## 2023-10-15 VITALS — BP 105/84 | HR 69 | Temp 98.3°F | Ht 66.0 in | Wt 206.4 lb

## 2023-10-15 DIAGNOSIS — Z716 Tobacco abuse counseling: Secondary | ICD-10-CM | POA: Diagnosis not present

## 2023-10-15 DIAGNOSIS — I10 Essential (primary) hypertension: Secondary | ICD-10-CM

## 2023-10-15 DIAGNOSIS — F324 Major depressive disorder, single episode, in partial remission: Secondary | ICD-10-CM | POA: Diagnosis not present

## 2023-10-15 DIAGNOSIS — F17210 Nicotine dependence, cigarettes, uncomplicated: Secondary | ICD-10-CM | POA: Diagnosis not present

## 2023-10-15 DIAGNOSIS — F4321 Adjustment disorder with depressed mood: Secondary | ICD-10-CM

## 2023-10-15 MED ORDER — VARENICLINE TARTRATE 0.5 MG PO TABS: 0.5000 mg | ORAL_TABLET | Freq: Two times a day (BID) | ORAL | 0 refills | Status: DC

## 2023-10-15 MED ORDER — VARENICLINE TARTRATE 0.5 MG PO TABS: 0.5000 mg | ORAL_TABLET | Freq: Every day | ORAL | 0 refills | Status: DC

## 2023-10-15 MED ORDER — VARENICLINE TARTRATE 1 MG PO TABS: 1.0000 mg | ORAL_TABLET | Freq: Two times a day (BID) | ORAL | 2 refills | Status: DC

## 2023-10-15 NOTE — Patient Instructions (Signed)
 SABRA

## 2023-10-15 NOTE — Assessment & Plan Note (Signed)
 Patient's blood pressure was controlled at 105/84.  Asked patient to continue taking her HCTZ 25 mg daily --Continue taking HCTZ 25 mg daily --Will get BMP today

## 2023-10-15 NOTE — Telephone Encounter (Signed)
 Rx needs to be sent by a different provider. (Bender,McLendon,Nooruddin,Zheng)

## 2023-10-15 NOTE — Patient Instructions (Signed)
 Please follow the directions as discussed in today's plan: You will start taking Chantix  a week before you decide to quit smoking completely.  These are the instructions for week 1: Days 1-3: Take 0.5 mg Chantix  daily Days 4-7: Take 0.5 mg Chantix  2 times daily Starting week 2: Start taking 1 mg Chantix  two times a day.  --Follow-up with [facility] for CBT --See us  in 1 month --We drew a lab today. Will call if results are abnormal. You can give us  a call if you have any questions.   Thank you for seeing us !  Sincerely, Dr. Edgardo

## 2023-10-15 NOTE — BH Specialist Note (Signed)
 Integrated Behavioral Health Initial In-Person Visit  MRN: [id number] Name: Christy Burke  Number of Integrated Behavioral Health Clinician visits: 1- Initial Visit  Session Start time: 1004    Session End time: 1034  Total time in minutes: 30  NO CHARGE  Types of Service: Introduction only  Interpretor:No. Interpretor Name and Language: N/A   Subjective: Christy Burke is a 63 y.o. female   Patient was referred by PCP for Counseling. Patient reports the following symptoms/concerns: The Licensed Clinical Engineer, building services (LCSW-A), acting as a Geophysicist/field seismologist [facility]), initiated a session with patient. The [facility] introduced themselves, explained her role, and provided contact information to the patient. Confidentiality and mandated reporting were discussed, and the patient denied any suicidal ideations or intent to harm others. The Integrated Behavioral Health (IBH) approach was reviewed, and a PHQ-9 assessment was completed.Patient reporting symptoms of Depression lasting longer than a year. Patient requested in-person session preferably in the morning.     09/17/2023   10:22 AM 09/17/2023   10:21 AM 03/27/2023   11:21 AM  PHQ-SADS Last 3 Score only  Total GAD-7 Score 14  2  PHQ Adolescent Score  10     Duration of problem: Longer than a year; Severity of problem: moderate  Objective: Mood: Anxious and Affect: Appropriate Risk of harm to self or others: No plan to harm self or others  Life Context: Family and Social: Information not gathered during session School/Work: Patient reported being in school at [employer] and schedule to graduate 2026 Self-Care: Information not gathered during session Life Changes: Patient reports passing of her father.  Patient and/or Family's Strengths/Protective Factors: Sense of purpose  Goals Addressed:  Information not gathered during session  Progress towards Goals: Other  Interventions: Interventions utilized: Introduction  only  Standardized Assessments completed: PHQ-SADS     Patient and/or Family Response: Patient agrees to Individual Counseling with [facility].  Patient Centered Plan: Patient is on the following Treatment Plan(s):  Information not gathered during session  Clinical Assessment/Diagnosis  Adjustment disorder with depressed mood   Assessment: Patient currently experiencing Depression.   Patient may benefit from Individual Counseling-CBT.  Plan: Follow up with behavioral health clinician on : 08/28: In-person Behavioral recommendations: CBT- Individual Counseling  Renda Pontes, MSW, LCSW-A She/Her Behavioral Health Clinician [facility]  Internal Medicine Center

## 2023-10-15 NOTE — Assessment & Plan Note (Signed)
 Pt came in wanting to quit smoking completely and getting more information about Chantix .  Patient mentioned that she used to smoke 1 cigarette previously but now only smokes half a cigarette since her last visit.  Instructed patient to start taking Chantix  1 week before she decides to quit smoking completely.  Prescribed Chantix  and asked patient to follow the instructions: instructions for week 1: Days 1-3: Take 0.5 mg Chantix  daily Days 4-7: Take 0.5 mg Chantix  2 times daily Starting week 2: Start taking 1 mg Chantix  two times a day. --Will follow-up in 1 month

## 2023-10-15 NOTE — Telephone Encounter (Unsigned)
 Copied from CRM [id number]. Topic: Clinical - Prescription Issue >> Oct 15, 2023 12:20 PM Carmell R wrote: Reason for CRM: Pt is asking if the new rx for varenicline  (CHANTIX ) 0.5 MG tablet can be sent to the [facility] on [address] instead. Patient is asking if she can be contacted once this is done. [phone]

## 2023-10-15 NOTE — Progress Notes (Signed)
 CC: Follow-up  HPI:  [name] is a 63 y.o. female living with a history stated below and presents today for a 4-week follow-up of mood, tobacco cessation.   Please see problem based assessment and plan for additional details.   Past Medical History:  Diagnosis Date   Asthma    History of umbilical hernia    Hypertension    Renal stones     Current Outpatient Medications on File Prior to Visit  Medication Sig Dispense Refill   albuterol  (VENTOLIN  HFA) 108 (90 Base) MCG/ACT inhaler Inhale 2 puffs into the lungs every 6 (six) hours as needed for wheezing or shortness of breath. 1 each 0   gabapentin  (NEURONTIN ) 300 MG capsule Take 1 capsule (300 mg total) by mouth at bedtime. 90 capsule 3   hydrochlorothiazide  (HYDRODIURIL ) 25 MG tablet Take 1 tablet (25 mg total) by mouth daily. 90 tablet 3   HYDROcodone -acetaminophen  (NORCO) 10-325 MG tablet Take 1 tablet by mouth 2 (two) times daily as needed. 60 tablet 0   methimazole  (TAPAZOLE ) 5 MG tablet TAKE 1 TABLET BY MOUTH IN THE MORNING 90 tablet 3   No current facility-administered medications on file prior to visit.    Family History  Problem Relation Age of Onset   Breast cancer Mother    Cancer Mother    Diabetes Father    Colon cancer Brother    Esophageal cancer Neg Hx    Rectal cancer Neg Hx    Stomach cancer Neg Hx     Social History   Socioeconomic History   Marital status: Married    Spouse name: Not on file   Number of children: Not on file   Years of education: Not on file   Highest education level: Not on file  Occupational History   Not on file  Tobacco Use   Smoking status: Every Day    Current packs/day: 0.50    Average packs/day: 0.5 packs/day for 39.0 years (19.5 ttl pk-yrs)    Types: Cigarettes   Smokeless tobacco: Never   Tobacco comments:    Sometimes less.  Substance and Sexual Activity   Alcohol use: No    Alcohol/week: 0.0 standard drinks of alcohol    Comment: Quit 9 yrs ago.    Drug use: No    Types: Cocaine    Comment: Quit several years ago, never did IVDU only smoked cocaine   Sexual activity: Yes    Birth control/protection: Other-see comments    Comment: In same sex relationship for 19 years  Other Topics Concern   Not on file  Social History Narrative   Not on file   Social Drivers of Health   Financial Resource Strain: Low Risk  (02/07/2022)   Overall Financial Resource Strain (CARDIA)    Difficulty of Paying Living Expenses: Not hard at all  Food Insecurity: No Food Insecurity (02/07/2022)   Hunger Vital Sign    Worried About Running Out of Food in the Last Year: Never true    Ran Out of Food in the Last Year: Never true  Transportation Needs: No Transportation Needs (02/07/2022)   PRAPARE - Administrator, Civil Service (Medical): No    Lack of Transportation (Non-Medical): No  Physical Activity: Not on file  Stress: Not on file  Social Connections: Socially Integrated (02/07/2022)   Social Connection and Isolation Panel    Frequency of Communication with Friends and Family: More than three times a week    Frequency  of Social Gatherings with Friends and Family: Twice a week    Attends Religious Services: 1 to 4 times per year    Active Member of [employer] or Organizations: No    Attends Engineer, structural: 1 to 4 times per year    Marital Status: Married  Catering manager Violence: Not At Risk (02/07/2022)   Humiliation, Afraid, Rape, and Kick questionnaire    Fear of Current or Ex-Partner: No    Emotionally Abused: No    Physically Abused: No    Sexually Abused: No    Review of Systems: ROS  All pertinent review of systems in history, assessment, plan Vitals:   10/15/23 0939  BP: 105/84  Pulse: 69  Temp: 98.3 F (36.8 C)  TempSrc: Oral  SpO2: 99%  Weight: 206 lb 6.4 oz (93.6 kg)  Height: 5' 6 (1.676 m)    Physical Exam: Physical Exam HENT:     Head: Normocephalic.  Cardiovascular:     Rate and Rhythm:  Normal rate and regular rhythm.  Musculoskeletal:     Right lower leg: No edema.     Left lower leg: No edema.  Neurological:     Mental Status: She is alert.      Assessment & Plan:     Patient seen with Dr. Shawn  Assessment & Plan Encounter for smoking cessation counseling Pt came in wanting to quit smoking completely and getting more information about Chantix .  Patient mentioned that she used to smoke 1 cigarette previously but now only smokes half a cigarette since her last visit.  Instructed patient to start taking Chantix  1 week before she decides to quit smoking completely.  Prescribed Chantix  and asked patient to follow the instructions: instructions for week 1: Days 1-3: Take 0.5 mg Chantix  daily Days 4-7: Take 0.5 mg Chantix  2 times daily Starting week 2: Start taking 1 mg Chantix  two times a day. --Will follow-up in 1 month Primary hypertension Patient's blood pressure was controlled at 105/84.  Asked patient to continue taking her HCTZ 25 mg daily --Continue taking HCTZ 25 mg daily --Will get BMP today Major depression single episode, in partial remission [facility]) Patient said that there has been some improvement in her mood since her previous visit.  She was able to talk to our behavioral therapist Renda over the phone . However, was not able to see Renda during her scheduled appointment previously as patient was not aware she had an appointment.  However, she spoke to [place] today and scheduled an appointment with her in the future.  Patient does not want any medication for her depression and said that she will follow-up with [facility] during her next appointment. --Appointment with Renda scheduled for CBT   No orders of the defined types were placed in this encounter.    Rebecka Pion, D.O. [facility] Internal [facility], PGY-1 Date 10/15/2023 Time 10:00 AM

## 2023-10-15 NOTE — Assessment & Plan Note (Signed)
 Patient said that there has been some improvement in her mood since her previous visit.  She was able to talk to our behavioral therapist Renda over the phone . However, was not able to see Renda during her scheduled appointment previously as patient was not aware she had an appointment.  However, she spoke to [place] today and scheduled an appointment with her in the future.  Patient does not want any medication for her depression and said that she will follow-up with [facility] during her next appointment. --Appointment with Renda scheduled for CBT

## 2023-10-16 LAB — BASIC METABOLIC PANEL WITH GFR
BUN/Creatinine Ratio: 12 (ref 12–28)
BUN: 11 mg/dL (ref 8–27)
CO2: 22 mmol/L (ref 20–29)
Calcium: 9.9 mg/dL (ref 8.7–10.3)
Chloride: 100 mmol/L (ref 96–106)
Creatinine, Ser: 0.89 mg/dL (ref 0.57–1.00)
Glucose: 89 mg/dL (ref 70–99)
Potassium: 3.7 mmol/L (ref 3.5–5.2)
Sodium: 139 mmol/L (ref 134–144)
eGFR: 74 mL/min/1.73 (ref >59)

## 2023-10-16 NOTE — Progress Notes (Signed)
 Internal Medicine Clinic Attending  I was physically present during the key portions of the resident provided service and participated in the medical decision making of patient's management care. I reviewed pertinent patient test results.  The assessment, diagnosis, and plan were formulated together and I agree with the documentation in the resident's note.  Jone Dauphin MD

## 2023-10-18 ENCOUNTER — Ambulatory Visit: Payer: Self-pay

## 2023-10-31 ENCOUNTER — Ambulatory Visit (INDEPENDENT_AMBULATORY_CARE_PROVIDER_SITE_OTHER): Admitting: Licensed Clinical Social Worker

## 2023-10-31 ENCOUNTER — Other Ambulatory Visit: Payer: Self-pay

## 2023-10-31 DIAGNOSIS — I10 Essential (primary) hypertension: Secondary | ICD-10-CM

## 2023-10-31 DIAGNOSIS — F1190 Opioid use, unspecified, uncomplicated: Secondary | ICD-10-CM

## 2023-10-31 DIAGNOSIS — F4321 Adjustment disorder with depressed mood: Secondary | ICD-10-CM

## 2023-10-31 NOTE — Telephone Encounter (Signed)
 Copied from CRM #[id number]. Topic: Clinical - Medication Refill >> Oct 31, 2023 12:10 PM [name] wrote: Patient called into the office to refill two prescriptions  Medication:   1. hydrochlorothiazide  (HYDRODIURIL ) 25 MG tablet 2. HYDROcodone -acetaminophen  (NORCO) 10-325 MG tablet  Has the patient contacted their pharmacy? Yes   This is the patient's preferred pharmacy:    [facility] [address] [place] - [address] [place] [address] Phone: [phone] Fax: [phone]  Is this the correct pharmacy for this prescription? Yes   Has the prescription been filled recently? No  Is the patient out of the medication? No, patient will be out of the HYDROcodone -acetaminophen  (NORCO) 10-325 MG tablet  Has the patient been seen for an appointment in the last year OR does the patient have an upcoming appointment? Yes  Can we respond through MyChart? Yes  Agent: Please be advised that Rx refills may take up to 3 business days. We ask that you follow-up with your pharmacy.

## 2023-10-31 NOTE — BH Specialist Note (Signed)
 Integrated Behavioral Health Follow Up In-Person Visit  MRN: [id number] Name: Marlaina Coburn  Number of Integrated Behavioral Health Clinician visits: Additional Visit  Session Start time: 0845   Session End time: 0945  Total time in minutes: 60    Types of Service: General Behavioral Integrated Care (BHI)  Interpretor:Yes.   Interpretor Name and Language: N/A  Subjective: Jarrett Chicoine is a 63 y.o. female  Patient was referred by PCP for [facility]. Patient reports the following symptoms/concerns: Prolonged Grief, Relationship conflict and self improvement.  Duration of problem: More than 5 years; Severity of problem: mild  Objective: Mood: NA and Affect: Appropriate Risk of harm to self or others: No plan to harm self or others  Life Context: Family and Social: Married more than 32 years and has a best friend. School/Work: Patient currently enrolled in an associates program at [employer] Self-Care: Patient aspires to travel.  Life Changes: Loss of both parents, marital conflict  Patient and/or Family's Strengths/Protective Factors: Sense of purpose  Goals Addressed: Patient will:  Reduce symptoms of: Prolonged Grief, self improvement    Increase knowledge and/or ability of: coping skills    Progress towards Goals: Ongoing  Interventions: Interventions utilized:  Solution-Focused Strategies and CBT Cognitive Behavioral Therapy Standardized Assessments completed: PHQ-SADS     10/15/2023    4:11 PM 10/15/2023    4:10 PM 09/17/2023   10:22 AM  PHQ-SADS Last 3 Score only  Total GAD-7 Score 6  14  PHQ Adolescent Score  8          Patient and/or Family Response: Patient finds counseling beneficial.   Patient Centered Plan: Patient is on the following Treatment Plan(s):   --For free grief support in [place], [place] , contact [facility]  ---Boundaries that protect connection Personal growth time: Each partner gets 1-2 uninterrupted blocks  weekly for their own growth (gym, class, faith group, therapy).  Device boundary: No heavy talks over text. If it's loaded, say: "Let's save this for our 30-minute meeting."  Safety boundary: If there's shouting, insults, threats, or stonewalling, enact Time-Out Protocol immediately.   ---Reflective Listening in 3 lines  Partner A: "What I'm trying to say is ____."  Partner B: "What I heard is _____. Did I get it?"  Partner A: "Mostly. Add ____."  Then switch. Clinical Assessment/Diagnosis  Adjustment disorder with depressed mood    Assessment: Patient currently experiencing Grief and relationship conflict.   Patient may benefit from Grief Counseling with [facility], ongoing counseling with with [facility] and marital counseling.   Plan: Follow up with behavioral health clinician on : 09/18; In-person @ 9:45 am   Renda Pontes, MSW, LCSW-A She/Her Behavioral Health Clinician [facility]  Internal Medicine Center

## 2023-10-31 NOTE — Telephone Encounter (Signed)
 LOV 10/15/23

## 2023-11-05 ENCOUNTER — Ambulatory Visit: Payer: Self-pay

## 2023-11-05 ENCOUNTER — Other Ambulatory Visit: Payer: Self-pay

## 2023-11-05 DIAGNOSIS — F1190 Opioid use, unspecified, uncomplicated: Secondary | ICD-10-CM

## 2023-11-05 DIAGNOSIS — I10 Essential (primary) hypertension: Secondary | ICD-10-CM

## 2023-11-05 NOTE — Telephone Encounter (Unsigned)
 Copied from CRM #[id number]. Topic: Clinical - Medication Refill >> Oct 31, 2023 12:10 PM [name] wrote: Patient called into the office to refill two prescriptions  Medication:   1. hydrochlorothiazide  (HYDRODIURIL ) 25 MG tablet 2. HYDROcodone -acetaminophen  (NORCO) 10-325 MG tablet  Has the patient contacted their pharmacy? Yes   This is the patient's preferred pharmacy:    [facility] [address] [place] - [address] [place] [address] Phone: [phone] Fax: [phone]  Is this the correct pharmacy for this prescription? Yes   Has the prescription been filled recently? No  Is the patient out of the medication? No, patient will be out of the HYDROcodone -acetaminophen  (NORCO) 10-325 MG tablet  Has the patient been seen for an appointment in the last year OR does the patient have an upcoming appointment? Yes  Can we respond through MyChart? Yes  Agent: Please be advised that Rx refills may take up to 3 business days. We ask that you follow-up with your pharmacy. >> Nov 05, 2023  9:29 AM [name] wrote: Patient put in prescription refill on last Thursday and has since ran out of prescription yesterday 11/03/23  Callback Number: [phone]

## 2023-11-05 NOTE — Telephone Encounter (Signed)
 Copied from CRM [id number]. Topic: Clinical - Red Word Triage >> Nov 05, 2023  9:29 AM [name] wrote: [facility] that prompted transfer to Nurse Triage: head spinning Answer Assessment - Initial Assessment Questions Patient was calling for a refill and passed to Nurse Triage due to feeling off this weekend. Patient states she was around a lot of people at a convention and she started feeling 'head spinning' and fatigued. Patient states Saturday she felt the worst and has improved since Saturday. Today she states she does not have any symptoms and she is up in her recliner and feeling okay. Patient state she will call back or go to UC if symptoms re-start. Patient asking for refills to be processed from previous CRM.  Protocols used: No Guideline Available-A-AH

## 2023-11-06 MED ORDER — HYDROCODONE-ACETAMINOPHEN 10-325 MG PO TABS: 1.0000 | ORAL_TABLET | Freq: Two times a day (BID) | ORAL | 0 refills | Status: DC | PRN

## 2023-11-06 NOTE — Telephone Encounter (Signed)
 Hydrochlorothiazide  has refills - pt was notified yesterday. Received another call from [facility] - stated pt has been waiting on Hydrocodone  refill since last Thursday, PCP is offline. Sending to Attending. Thanks

## 2023-11-06 NOTE — Telephone Encounter (Signed)
 Pt called - no answer, left message of med refill,

## 2023-11-11 MED ORDER — HYDROCHLOROTHIAZIDE 25 MG PO TABS: 25.0000 mg | ORAL_TABLET | Freq: Every day | ORAL | 3 refills | Status: DC

## 2023-11-12 ENCOUNTER — Encounter: Payer: Self-pay | Admitting: Emergency Medicine

## 2023-11-12 ENCOUNTER — Other Ambulatory Visit: Payer: Self-pay

## 2023-11-12 ENCOUNTER — Ambulatory Visit
Admission: EM | Admit: 2023-11-12 | Discharge: 2023-11-12 | Disposition: A | Discharge status: Home / Self Care | Attending: Physician Assistant | Admitting: Physician Assistant

## 2023-11-12 DIAGNOSIS — J069 Acute upper respiratory infection, unspecified: Secondary | ICD-10-CM | POA: Diagnosis not present

## 2023-11-12 DIAGNOSIS — N200 Calculus of kidney: Secondary | ICD-10-CM | POA: Insufficient documentation

## 2023-11-12 LAB — POC SOFIA SARS ANTIGEN FIA: SARS Coronavirus 2 Ag: NEGATIVE

## 2023-11-12 MED ORDER — PROMETHAZINE-DM 6.25-15 MG/5ML PO SYRP: 5.0000 mL | ORAL_SOLUTION | Freq: Four times a day (QID) | ORAL | 0 refills | Status: DC | PRN

## 2023-11-12 MED ORDER — BENZONATATE 100 MG PO CAPS: 100.0000 mg | ORAL_CAPSULE | Freq: Three times a day (TID) | ORAL | 0 refills | Status: DC

## 2023-11-12 NOTE — ED Provider Notes (Signed)
 [name]    CSN: [id number] Arrival date & time: 11/12/23  0810      History   Chief Complaint Chief Complaint  Patient presents with  . Cough    HPI Christy Burke is a 63 y.o. female.    Cough Associated symptoms: sore throat   Associated symptoms: no chills, no ear pain, no eye discharge, no fever, no shortness of breath and no wheezing     Past Medical History:  Diagnosis Date  . Asthma   . History of umbilical hernia   . Hypertension   . Renal stones     Patient Active Problem List   Diagnosis Date Noted  . Calculus of left kidney 11/12/2023  . Major depression single episode, in partial remission (HCC) 10/17/2022  . Cognitive impairment 10/03/2022  . Influenza due to identified novel influenza A virus with other respiratory manifestations 06/30/2022  . Chronic, continuous use of opioids 11/01/2021  . History of total knee replacement, right 11/30/2020  . Plantar fasciitis of right foot 08/26/2019  . Paresthesia of arm 11/05/2018  . Obesity (BMI 30.0-34.9) 11/05/2018  . HTN (hypertension) 11/05/2018  . 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 Graves disease 04/15/2017  . History of latent tuberculosis 09/25/2016  . Osteoarthritis of right thumb 07/26/2016  . Right hand pain 02/09/2016  . Thumb pain, right 02/09/2016  . Pre-diabetes 09/29/2015  . Status post TAH-BSO 09/21/2015  . Tendonitis of wrist, left 12/11/2014  . Preventative health care 09/07/2014  . Degenerative disc disease at L5-S1 level 06/04/2013  . Encounter for smoking cessation counseling 05/23/2012    Past Surgical History:  Procedure Laterality Date  . lithotrypsy    . TOTAL ABDOMINAL HYSTERECTOMY W/ BILATERAL SALPINGOOPHORECTOMY    . TOTAL KNEE ARTHROPLASTY Right 01/2022  . UMBILICAL HERNIA REPAIR  03/05/1988    OB History   No obstetric history on file.      Home Medications    Prior to Admission medications   Medication Sig Start Date End Date Taking?  Authorizing Provider  albuterol  (VENTOLIN  HFA) 108 (90 Base) MCG/ACT inhaler Inhale 2 puffs into the lungs every 6 (six) hours as needed for wheezing or shortness of breath. 06/25/22   Richad Jon HERO, NP  gabapentin  (NEURONTIN ) 300 MG capsule Take 1 capsule (300 mg total) by mouth at bedtime. 03/27/23   Guilloud, Carolyn, MD  hydrochlorothiazide  (HYDRODIURIL ) 25 MG tablet Take 1 tablet (25 mg total) by mouth daily. 11/11/23   Nguyen, Diana, MD  HYDROcodone -acetaminophen  (NORCO) 10-325 MG tablet Take 1 tablet by mouth 2 (two) times daily as needed. 11/06/23   Karna Fellows, MD  methimazole  (TAPAZOLE ) 5 MG tablet TAKE 1 TABLET BY MOUTH IN THE MORNING 03/22/23   Trixie File, MD  varenicline  (CHANTIX  CONTINUING MONTH PAK) 1 MG tablet Take 1 tablet (1 mg total) by mouth 2 (two) times daily. 10/15/23   Syeda, Raeeha, DO  varenicline  (CHANTIX ) 0.5 MG tablet Take 1 tablet (0.5 mg total) by mouth 2 (two) times daily. 10/15/23   Syeda, Raeeha, DO  varenicline  (CHANTIX ) 0.5 MG tablet Take 1 tablet (0.5 mg total) by mouth daily. 10/15/23   Edgardo Pontiff, DO    Family History Family History  Problem Relation Age of Onset  . Breast cancer Mother   . Cancer Mother   . Diabetes Father   . Colon cancer Brother   . Esophageal cancer Neg Hx   . Rectal cancer Neg Hx   . Stomach cancer Neg Hx  Social History Social History   Tobacco Use  . Smoking status: Every Day    Current packs/day: 0.50    Average packs/day: 0.5 packs/day for 39.0 years (19.5 ttl pk-yrs)    Types: Cigarettes  . Smokeless tobacco: Never  . Tobacco comments:    Sometimes less.  Substance Use Topics  . Alcohol use: No    Alcohol/week: 0.0 standard drinks of alcohol    Comment: Quit 9 yrs ago.  . Drug use: No    Types: Cocaine    Comment: Quit several years ago, never did IVDU only smoked cocaine     Allergies   Patient has no known allergies.   Review of Systems Review of Systems  Constitutional:  Negative for chills and  fever.  HENT:  Positive for congestion, sinus pressure and sore throat. Negative for ear pain.   Eyes:  Negative for discharge and redness.  Respiratory:  Positive for cough. Negative for shortness of breath and wheezing.   Gastrointestinal:  Negative for abdominal pain, diarrhea, nausea and vomiting.     Physical Exam Triage Vital Signs ED Triage Vitals  Encounter Vitals Group     BP 11/12/23 0825 135/88     Girls Systolic BP Percentile --      Girls Diastolic BP Percentile --      Boys Systolic BP Percentile --      Boys Diastolic BP Percentile --      Pulse Rate 11/12/23 0825 78     Resp 11/12/23 0825 18     Temp 11/12/23 0825 98 F (36.7 C)     Temp Source 11/12/23 0825 Oral     SpO2 11/12/23 0825 97 %     Weight --      Height --      Head Circumference --      Peak Flow --      Pain Score 11/12/23 0826 2     Pain Loc --      Pain Education --      Exclude from Growth Chart --    No data found.  Updated Vital Signs BP 135/88 (BP Location: Left Arm)   Pulse 78   Temp 98 F (36.7 C) (Oral)   Resp 18   LMP 03/14/2015 (Approximate)   SpO2 97%   Visual Acuity Right Eye Distance:   Left Eye Distance:   Bilateral Distance:    Right Eye Near:   Left Eye Near:    Bilateral Near:     Physical Exam Vitals and nursing note reviewed.  Constitutional:      General: She is not in acute distress.    Appearance: Normal appearance. She is not ill-appearing.  HENT:     Head: Normocephalic and atraumatic.     Right Ear: Tympanic membrane normal.     Left Ear: Tympanic membrane normal.     Nose: Congestion present.     Mouth/Throat:     Mouth: Mucous membranes are moist.     Pharynx: No oropharyngeal exudate or posterior oropharyngeal erythema.  Eyes:     Conjunctiva/sclera: Conjunctivae normal.  Cardiovascular:     Rate and Rhythm: Normal rate and regular rhythm.     Heart sounds: Normal heart sounds. No murmur heard. Pulmonary:     Effort: Pulmonary effort is  normal. No respiratory distress.     Breath sounds: Normal breath sounds. No wheezing, rhonchi or rales.  Skin:    General: Skin is warm and dry.  Neurological:  Mental Status: She is alert.  Psychiatric:        Mood and Affect: Mood normal.        Thought Content: Thought content normal.      UC Treatments / Results  Labs (all labs ordered are listed, but only abnormal results are displayed) Labs Reviewed  POC SOFIA SARS ANTIGEN FIA - Normal    EKG   Radiology No results found.  Procedures Procedures (including critical care time)  Medications Ordered in UC Medications - No data to display  Initial Impression / Assessment and Plan / UC Course  I have reviewed the triage vital signs and the nursing notes.  Pertinent labs & imaging results that were available during my care of the patient were reviewed by me and considered in my medical decision making (see chart for details).     *** Final Clinical Impressions(s) / UC Diagnoses   Final diagnoses:  Viral upper respiratory tract infection   Discharge Instructions   None    ED Prescriptions   None    PDMP not reviewed this encounter.

## 2023-11-12 NOTE — ED Triage Notes (Signed)
 Pt here for cough and congestion with body aches x 5 days; denies fever

## 2023-11-15 ENCOUNTER — Ambulatory Visit: Admitting: Student

## 2023-11-15 ENCOUNTER — Encounter: Payer: Self-pay | Admitting: Student

## 2023-11-15 ENCOUNTER — Other Ambulatory Visit: Payer: Self-pay

## 2023-11-15 VITALS — BP 129/78 | HR 70 | Temp 98.2°F | Ht 67.0 in | Wt 209.0 lb

## 2023-11-15 DIAGNOSIS — Z716 Tobacco abuse counseling: Secondary | ICD-10-CM | POA: Diagnosis not present

## 2023-11-15 DIAGNOSIS — Z23 Encounter for immunization: Secondary | ICD-10-CM

## 2023-11-15 DIAGNOSIS — F324 Major depressive disorder, single episode, in partial remission: Secondary | ICD-10-CM

## 2023-11-15 DIAGNOSIS — F17210 Nicotine dependence, cigarettes, uncomplicated: Secondary | ICD-10-CM

## 2023-11-15 NOTE — Assessment & Plan Note (Addendum)
 Patient was last seen in the clinic 1 month prior where she was prescribed Chantix .  Patient presents for follow-up of smoking cessation today.  Patient reports that she has yet to start taking the Chantix  and she wanted to check and see if it was safe with her methimazole . Patient is still smoking.  Plan: - Provided reassurance that Chantix  does not have unsafe interactions with methimazole  - Patient can follow-up in 3 months with a televisit for tobacco cessation.

## 2023-11-15 NOTE — Assessment & Plan Note (Signed)
 Patient is PHQ-9 today is 9, which is stable from the last visit where it was 8.  She continues to decline medications and continues to defer speaking with [place] with counseling.  She denies suicidal or homicidal ideations today. Plan: - Follow-up on patient's depression during 23-month telehealth visit for tobacco cessation -Continue counseling with [facility].

## 2023-11-15 NOTE — Patient Instructions (Signed)
 Thank you, [name] for allowing us  to provide your care today. Today we discussed tobacco cessation. The chantix  is SAFE with methimazole !   If your cold does not get better or you start to have fevers/chills and shortness of breath: give us  a call!     Follow up: Telehealth in 3 months for tobacco cessation      Should you have any questions or concerns please call the internal medicine clinic at [phone].     Please note that our late policy has changed.  If you are more than 15 minutes late to your appointment, you may be asked to reschedule your appointment.  Dr. Kandis, D.O. [facility]

## 2023-11-15 NOTE — Progress Notes (Signed)
 Established Patient Office Visit  Subjective   Patient ID: Christy Burke, female    DOB: [birth year]  Age: 63 y.o. MRN: [id number]  Chief Complaint  Patient presents with   Follow-up   Depression   Hypertension    Christy Burke is a 63 y.o. who presents to the clinic for follow up of depression and tobacco cessation. Please see problem based assessment and plan for additional details.    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   Graves disease 04/15/2017   History of latent tuberculosis 09/25/2016   Osteoarthritis of right thumb 07/26/2016   Right hand pain 02/09/2016   Thumb pain, right 02/09/2016   Pre-diabetes 09/29/2015   Status post TAH-BSO 09/21/2015   Tendonitis of wrist, left 12/11/2014   Preventative health care 09/07/2014   Degenerative disc disease at L5-S1 level 06/04/2013   Encounter for smoking cessation counseling 05/23/2012       Objective:     BP 129/78 (BP Location: Right Arm, Cuff Size: Large)   Pulse 70   Temp 98.2 F (36.8 C) (Oral)   Ht 5' 7 (1.702 m)   Wt 209 lb (94.8 kg)   LMP 03/14/2015 (Approximate)   SpO2 100%   BMI 32.73 kg/m  BP Readings from Last 3 Encounters:  11/15/23 129/78  11/12/23 135/88  10/15/23 105/84   Wt Readings from Last 3 Encounters:  11/15/23 209 lb (94.8 kg)  10/15/23 206 lb 6.4 oz (93.6 kg)  09/17/23 208 lb (94.3 kg)      Physical Exam Vitals reviewed.   Constitutional:      General: She is not in acute distress.    Appearance: She is not ill-appearing, toxic-appearing or diaphoretic.  Cardiovascular:     Rate and Rhythm: Normal rate and regular rhythm.     Heart sounds: No murmur heard. Pulmonary:     Effort: Pulmonary effort is normal. No respiratory distress.     Breath sounds: Normal breath sounds. No wheezing or rales.  Musculoskeletal:     Right lower leg: No edema.     Left lower leg: No edema.  Skin:    General: Skin is warm and dry.  Neurological:     Mental Status: She is alert.  Psychiatric:        Mood and Affect: Mood and affect normal.     Last metabolic panel Lab Results  Component Value Date   GLUCOSE 89 10/15/2023   NA 139 10/15/2023   K 3.7 10/15/2023   CL 100 10/15/2023   CO2 22 10/15/2023   BUN 11 10/15/2023   CREATININE 0.89 10/15/2023   EGFR 74 10/15/2023   CALCIUM 9.9 10/15/2023   PROT 6.2 (L) 01/13/2015   ALBUMIN 3.6 01/13/2015   BILITOT 0.2 (L) 01/13/2015   ALKPHOS 75 01/13/2015   AST 38 01/13/2015   ALT 60 (H) 01/13/2015   ANIONGAP 7 04/27/2015   Last lipids Lab Results  Component Value Date   CHOL 147 09/07/2014  HDL 39 (L) 09/07/2014   LDLCALC 88 09/07/2014   TRIG 99 09/07/2014   CHOLHDL 3.8 09/07/2014   Last hemoglobin A1c Lab Results  Component Value Date   HGBA1C 6.0 (A) 09/17/2023      The ASCVD Risk score (Arnett DK, et al., 2019) failed to calculate for the following reasons:   Cannot find a previous HDL lab   Cannot find a previous total cholesterol lab    Assessment & Plan:   Problem List Items Addressed This Visit       Other   Encounter for smoking cessation counseling (Chronic)   Patient was last seen in the clinic 1 month prior where she was prescribed Chantix .  Patient presents for follow-up of smoking cessation today.  Patient reports that she has yet to start taking the Chantix  and she wanted to check and see if it was safe with her methimazole . Patient  is still smoking.  Plan: - Provided reassurance that Chantix  does not have unsafe interactions with methimazole  - Patient can follow-up in 3 months with a televisit for tobacco cessation.      Major depression single episode, in partial remission [facility])   Patient is PHQ-9 today is 9, which is stable from the last visit where it was 8.  She continues to decline medications and continues to defer speaking with [place] with counseling.  She denies suicidal or homicidal ideations today. Plan: - Follow-up on patient's depression during 56-month telehealth visit for tobacco cessation -Continue counseling with [facility].      Other Visit Diagnoses       Encounter for immunization    -  Primary   Relevant Orders   Flu vaccine trivalent PF, 6mos and older(Flulaval,Afluria,Fluarix,Fluzone) (Completed)       Return in about 3 months (around 02/14/2024) for Telehealth visit for tobacco cessation  .    Damien Lease, DO

## 2023-11-19 NOTE — Progress Notes (Signed)
 Internal Medicine Clinic Attending  Case discussed with the resident at the time of the visit.  We reviewed the resident's history and exam and pertinent patient test results.  I agree with the assessment, diagnosis, and plan of care documented in the resident's note.

## 2023-11-21 ENCOUNTER — Ambulatory Visit (INDEPENDENT_AMBULATORY_CARE_PROVIDER_SITE_OTHER): Admitting: Licensed Clinical Social Worker

## 2023-11-21 DIAGNOSIS — F4321 Adjustment disorder with depressed mood: Secondary | ICD-10-CM

## 2023-11-21 NOTE — BH Specialist Note (Signed)
 Integrated Behavioral Health Follow Up In-Person Visit  MRN: [id number] Name: Christy Burke  Number of Integrated Behavioral Health Clinician visits: 2- Second Visit  Session Start time: 0945   Session End time: 1045  Total time in minutes: 60    TTypes of Service: General Behavioral Integrated Care (BHI)   Interpretor:Yes.   Interpretor Name and Language: N/A   Subjective: Christy Burke is a 63 y.o. female  Patient was referred by PCP for [facility]. Patient reports the following symptoms/concerns: Patient presented as calm, cooperative and engaging on today. Patient discussed recently celebrating her birthday and enjoying it. Patient plans to go out with her wife this weekend. Session was spent discussing patients childhood and current stressors in her marriage. [facility] validated patients feelings and practice re framing and  solution focused recommendations. Patient is satisfied with her life, continues to attend NA meetings and is officially in 13 years of remission. Session concluded with patient scheduling for next visit.   Duration of problem: More than 5 years; Severity of problem: mild   Objective: Mood: NA and Affect: Appropriate Risk of harm to self or others: No plan to harm self or others   Life Context: Family and Social: Married more than 32 years and has a best friend. School/Work: Patient currently enrolled in an associates program at [employer] Self-Care: Patient aspires to travel.  Life Changes: Loss of both parents, marital conflict   Patient and/or Family's Strengths/Protective Factors: Sense of purpose   Goals Addressed: Patient will:  Reduce symptoms of: Prolonged Grief, self improvement    Increase knowledge and/or ability of: coping skills      Progress towards Goals: Ongoing   Interventions: Interventions utilized:  Solution-Focused Strategies and CBT Cognitive Behavioral Therapy Standardized Assessments completed: Declined on today           Patient  and/or Family Response: Patient finds counseling beneficial.    Patient Centered Plan: Patient is on the following Treatment Plan(s): Monthly sessions with [facility]       Adjustment disorder with depressed mood      Assessment: Patient currently experiencing Grief and relationship conflict.    Patient may benefit from Grief Counseling with [facility], ongoing counseling with with [facility] and marital counseling.    Plan: Follow up with behavioral health clinician on : 10/23; In-person @    Renda Pontes, MSW, LCSW-A She/Her [employer] [facility] Clinician [facility]  Internal Medicine Center

## 2023-12-03 ENCOUNTER — Other Ambulatory Visit: Payer: Self-pay

## 2023-12-03 DIAGNOSIS — F1190 Opioid use, unspecified, uncomplicated: Secondary | ICD-10-CM

## 2023-12-03 MED ORDER — HYDROCODONE-ACETAMINOPHEN 10-325 MG PO TABS: 1.0000 | ORAL_TABLET | Freq: Two times a day (BID) | ORAL | 0 refills | Status: DC | PRN

## 2023-12-03 NOTE — Telephone Encounter (Signed)
 Copied from CRM [id number]. Topic: Clinical - Medication Refill >> Dec 03, 2023  9:07 AM Susanna ORN wrote: Medication: HYDROcodone -acetaminophen  (NORCO) 10-325 MG tablet   Has the patient contacted their pharmacy? No (Agent: If no, request that the patient contact the pharmacy for the refill. If patient does not wish to contact the pharmacy document the reason why and proceed with request.) (Agent: If yes, when and what did the pharmacy advise?)  This is the patient's preferred pharmacy:   [facility] [address], [place] - [address] [place] [address] Phone: [phone] Fax: [phone]  Is this the correct pharmacy for this prescription? Yes If no, delete pharmacy and type the correct one.   Has the prescription been filled recently? Yes  Is the patient out of the medication? Yes  Has the patient been seen for an appointment in the last year OR does the patient have an upcoming appointment? Yes  Can we respond through MyChart? Yes  Agent: Please be advised that Rx refills may take up to 3 business days. We ask that you follow-up with your pharmacy.

## 2023-12-26 ENCOUNTER — Ambulatory Visit: Admitting: Licensed Clinical Social Worker

## 2023-12-26 DIAGNOSIS — F4321 Adjustment disorder with depressed mood: Secondary | ICD-10-CM | POA: Diagnosis present

## 2023-12-26 DIAGNOSIS — F324 Major depressive disorder, single episode, in partial remission: Secondary | ICD-10-CM

## 2023-12-30 ENCOUNTER — Other Ambulatory Visit: Payer: Self-pay

## 2023-12-30 DIAGNOSIS — F1190 Opioid use, unspecified, uncomplicated: Secondary | ICD-10-CM

## 2023-12-30 DIAGNOSIS — I10 Essential (primary) hypertension: Secondary | ICD-10-CM

## 2023-12-30 MED ORDER — HYDROCODONE-ACETAMINOPHEN 10-325 MG PO TABS: 1.0000 | ORAL_TABLET | Freq: Two times a day (BID) | ORAL | 0 refills | Status: DC | PRN

## 2023-12-30 MED ORDER — HYDROCHLOROTHIAZIDE 25 MG PO TABS: 25.0000 mg | ORAL_TABLET | Freq: Every day | ORAL | 3 refills | Status: AC

## 2023-12-30 NOTE — Telephone Encounter (Signed)
 Copied from CRM [id number]. Topic: Clinical - Medication Refill >> Dec 30, 2023  8:36 AM Antonio H wrote: Medication: HYDROcodone -acetaminophen  (NORCO) 10-325 MG tablet hydrochlorothiazide  (HYDRODIURIL ) 25 MG tablet  Has the patient contacted their pharmacy? No (Agent: If no, request that the patient contact the pharmacy for the refill. If patient does not wish to contact the pharmacy document the reason why and proceed with request.) (Agent: If yes, when and what did the pharmacy advise?)  This is the patient's preferred pharmacy:   [facility] [address] [place] - [address] [place] [address] Phone: [phone] Fax: [phone]  Is this the correct pharmacy for this prescription? Yes If no, delete pharmacy and type the correct one.   Has the prescription been filled recently? No  Is the patient out of the medication? Has 1 hydrocodone -acetaminophen  left and a couple of the hydrocholorothiazide left   Has the patient been seen for an appointment in the last year OR does the patient have an upcoming appointment? Yes  Can we respond through MyChart? No  Agent: Please be advised that Rx refills may take up to 3 business days. We ask that you follow-up with your pharmacy.

## 2024-01-16 ENCOUNTER — Ambulatory Visit: Admitting: Licensed Clinical Social Worker

## 2024-01-16 ENCOUNTER — Telehealth (INDEPENDENT_AMBULATORY_CARE_PROVIDER_SITE_OTHER): Payer: Self-pay | Admitting: Licensed Clinical Social Worker

## 2024-01-16 NOTE — Telephone Encounter (Signed)
 [facility] attempted patient on today regarding appointment and message to return call to patient. [facility] left a VM.  Renda Pontes, MSW, LCSW-A She/Her Behavioral Health Clinician [facility]  Internal Medicine Center Direct Dial:[phone]  Fax [phone] Main Office Phone: [phone]

## 2024-01-17 ENCOUNTER — Telehealth: Payer: Self-pay

## 2024-01-17 NOTE — Telephone Encounter (Signed)
 Message has been forwarded Renda Pontes to follow up with patient.  Copied from CRM #[id number]. Topic: Appointments - Scheduling Inquiry for Clinic >> Jan 17, 2024  9:19 AM Graeme ORN wrote: Reason for CRM: Patient called request to speak to [facility]. Missed a call and appt. She had death in family and is out of town in [place] . Needs to reschedule. No symptoms or concerns.

## 2024-01-21 NOTE — BH Specialist Note (Unsigned)
 Integrated Behavioral Health Follow Up In-Person Visit  MRN: [id number] Name: Christy Burke  Number of Integrated Behavioral Health Clinician visits: Additional Visit  Session Start time: 0945   Session End time: 1045  Total time in minutes: 60    Types of Service: {CHL AMB TYPE OF SERVICE:[phone]}  Interpretor:{yes [id number]} Interpretor Name and Language: ***  Subjective: Christy Burke is a 63 y.o. female accompanied by {Patient accompanied [phone]} Patient was referred by *** for ***. Patient reports the following symptoms/concerns: *** Duration of problem: ***; Severity of problem: {Mild/Moderate/Severe:20260}  Objective: Mood: {BHH MOOD:22306} and Affect: {BHH AFFECT:22307} Risk of harm to self or others: {CHL AMB BH Suicide Current Mental Status:21022748}  Life Context: Family and Social: *** School/Work: *** Self-Care: *** Life Changes: ***  Patient and/or Family's Strengths/Protective Factors: {CHL AMB BH PROTECTIVE FACTORS:[phone]}  Goals Addressed: Patient will:  Reduce symptoms of: {IBH Symptoms:21014056}   Increase knowledge and/or ability of: {IBH Patient Tools:21014057}   Demonstrate ability to: {IBH Goals:21014053}  Progress towards Goals: {CHL AMB BH PROGRESS TOWARDS GOALS:[phone]}  Interventions: Interventions utilized:  {IBH Interventions:21014054} Standardized Assessments completed: {IBH Screening Tools:21014051}      Patient and/or Family Response: ***  Patient Centered Plan: Patient is on the following Treatment Plan(s): ***  Clinical Assessment/Diagnosis  Major depression single episode, in partial remission    Assessment: Patient currently experiencing ***.   Patient may benefit from ***.  Plan: Follow up with behavioral health clinician on : *** Behavioral recommendations: *** Referral(s): {IBH Referrals:21014055}  Christy Burke

## 2024-01-21 NOTE — Patient Instructions (Signed)
 Christy Burke

## 2024-01-28 ENCOUNTER — Telehealth: Payer: Self-pay | Admitting: *Deleted

## 2024-01-28 ENCOUNTER — Other Ambulatory Visit: Payer: Self-pay

## 2024-01-28 DIAGNOSIS — F1190 Opioid use, unspecified, uncomplicated: Secondary | ICD-10-CM

## 2024-01-28 MED ORDER — HYDROCODONE-ACETAMINOPHEN 10-325 MG PO TABS: 1.0000 | ORAL_TABLET | Freq: Two times a day (BID) | ORAL | 0 refills | Status: DC | PRN

## 2024-01-28 NOTE — Telephone Encounter (Signed)
 Copied from CRM [id number]. Topic: Clinical - Medication Refill >> Jan 28, 2024  9:03 AM Graeme ORN wrote: Medication: HYDROcodone -acetaminophen  (NORCO) 10-325 MG tablet  Has the patient contacted their pharmacy? No (Agent: If no, request that the patient contact the pharmacy for the refill. If patient does not wish to contact the pharmacy document the reason why and proceed with request.) (Agent: If yes, when and what did the pharmacy advise?)  This is the patient's preferred pharmacy:  [facility] [address], [place] - [address] [place] [address] Phone: [phone] Fax: [phone]  Is this the correct pharmacy for this prescription? Yes If no, delete pharmacy and type the correct one.   Has the prescription been filled recently? Yes  Is the patient out of the medication? No only has 1 left  Has the patient been seen for an appointment in the last year OR does the patient have an upcoming appointment? Yes  Can we respond through MyChart? No  Agent: Please be advised that Rx refills may take up to 3 business days. We ask that you follow-up with your pharmacy.

## 2024-01-28 NOTE — Telephone Encounter (Signed)
 Call from pt Requesting call back from Bianca-just wanted to touch base Renda is currently out of the office-pt made aware Pt didn't have any urgent requests/needs at this time-will await call back.

## 2024-02-05 NOTE — Telephone Encounter (Signed)
 Spoke with the patient who verbalized understanding unable to sch with Bian[facility].  The patient will call back in January.  Copied from CRM [id number]. Topic: General - Other >> Feb 05, 2024  1:17 PM Suzette B wrote: Reason for CRM: Patient listed above called to speak with Ms. Renda Pontes  e, advised that she needed to speak to her in regard to an appt. Please call patient back [phone]

## 2024-02-12 ENCOUNTER — Other Ambulatory Visit: Payer: Self-pay

## 2024-02-12 ENCOUNTER — Ambulatory Visit

## 2024-02-12 VITALS — BP 131/87 | HR 64 | Temp 98.2°F | Ht 67.0 in | Wt 209.0 lb

## 2024-02-12 DIAGNOSIS — Z716 Tobacco abuse counseling: Secondary | ICD-10-CM | POA: Diagnosis not present

## 2024-02-12 DIAGNOSIS — I10 Essential (primary) hypertension: Secondary | ICD-10-CM

## 2024-02-12 DIAGNOSIS — R7303 Prediabetes: Secondary | ICD-10-CM

## 2024-02-12 DIAGNOSIS — M51379 Other intervertebral disc degeneration, lumbosacral region without mention of lumbar back pain or lower extremity pain: Secondary | ICD-10-CM

## 2024-02-12 DIAGNOSIS — Z96651 Presence of right artificial knee joint: Secondary | ICD-10-CM

## 2024-02-12 MED ORDER — GABAPENTIN 300 MG PO CAPS: 300.0000 mg | ORAL_CAPSULE | Freq: Every day | ORAL | 3 refills | Status: DC

## 2024-02-12 MED ORDER — GABAPENTIN 300 MG PO CAPS: 300.0000 mg | ORAL_CAPSULE | Freq: Every day | ORAL | 3 refills | Status: AC

## 2024-02-12 NOTE — Assessment & Plan Note (Signed)
 Has a history of hypertension and blood pressure has been well-controlled on hydrochlorothiazide  25 mg daily.  She reports complete adherence to this. Blood pressure today 145/88 and then 131/87 on repeat.  We will obtain BMP today to check kidney function and electrolytes. - Continue chlorothiazide 25 mg daily - f/u BMP

## 2024-02-12 NOTE — Assessment & Plan Note (Addendum)
 Patient has been working on smoking cessation, and today reports that she is still using about 0.5 PPD, though when she does because arete she does not smoke the whole thing which is an improvement from before.  She has Chantix  which has been previously prescribed, though she has not initiated this yet.  She has a pack-year history of 24 years, and has never undergone low-dose CT lung cancer screening.  I discussed this with her today and recommend obtaining low-dose CT lung cancer scan.  I continue to encourage her to work on smoking cessation, and we discussed this for ~5 minutes. - Continue to work on smoking cessation - Chantix  already prescribed - LDCT Chest scan order placed

## 2024-02-12 NOTE — Progress Notes (Signed)
 Patient name: Christy Burke Date of birth: 02/16/1961 Date of visit: 02/12/24  Type of visit: Established Patient Office Visit   Subjective   Chief concern:  Chief Complaint  Patient presents with   Follow-up    Patient request increase on medication-running out quicker / bilateral knee pain # 7    Tayte Childers is a 63 y.o. female with a history of Grave's disease (on methimazole , managed by Endo), HTN (on hydrochlorothiazide ), pre-diabetes, hx of latent TB, asthma, and s/p right TKA (01/2022) and chronic opioid use who presents to [facility] clinic for follow up of chronic conditions.  Patient presents to clinic today for recheck of her chronic conditions.  She reports that she has been doing overall, though with the colder weather her knees and back have especially been bothering her.  This holiday season will be her first since losing her father, though she reports that she is well supported by her wife, and she continues to see Renda for CBT which has been very helpful for her.  She also reports that she has been cutting back on her smoking.  She is currently using about 0.5 PPD, though she is not smoking an entire cigarette each time.  She has not yet joined a gym, though this is on her to do list and she is excited to get started with this.  She reports complete adherence to her medications including hydrochlorothiazide  25 mg daily.  Blood pressure today 145/88 and then 131/87 on repeat.  ROS: Negative unless otherwise listed in the HPI.  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   Graves disease 04/15/2017   History of latent  tuberculosis 09/25/2016   Pre-diabetes 09/29/2015   Status post TAH-BSO 09/21/2015   Preventative health care 09/07/2014   Degenerative disc disease at L5-S1 level 06/04/2013   Encounter for smoking cessation counseling 05/23/2012     Past Surgical History:  Procedure Laterality Date   lithotrypsy     TOTAL ABDOMINAL HYSTERECTOMY W/ BILATERAL SALPINGOOPHORECTOMY     TOTAL KNEE ARTHROPLASTY Right 01/2022   UMBILICAL HERNIA REPAIR  03/05/1988     Current Outpatient Medications  Medication Instructions   albuterol  (VENTOLIN  HFA) 108 (90 Base) MCG/ACT inhaler 2 puffs, Inhalation, Every 6 hours PRN   gabapentin  (NEURONTIN ) 300 mg, Oral, Daily at bedtime   hydrochlorothiazide  (HYDRODIURIL ) 25 mg, Oral, Daily   HYDROcodone -acetaminophen  (NORCO) 10-325 MG tablet 1 tablet, Oral, 2 times daily PRN   methimazole  (TAPAZOLE ) 5 MG tablet TAKE 1 TABLET BY MOUTH IN THE MORNING   varenicline  (CHANTIX  CONTINUING MONTH PAK) 1 mg, Oral, 2 times daily   varenicline  (CHANTIX ) 0.5 mg, Oral, 2 times daily   varenicline  (CHANTIX ) 0.5 mg, Oral, Daily    Social History   Tobacco Use   Smoking status: Every Day    Current packs/day: 0.50    Average packs/day: 0.5 packs/day for 39.0 years (19.5 ttl pk-yrs)    Types: Cigarettes   Smokeless tobacco: Never   Tobacco comments:    Sometimes less.  Substance Use Topics   Alcohol use: No    Alcohol/week: 0.0 standard drinks of alcohol    Comment: Quit 9 yrs ago.  Drug use: No    Types: Cocaine    Comment: Quit several years ago, never did IVDU only smoked cocaine      Objective  Today's Vitals   02/12/24 0836 02/12/24 0939  BP: (!) 145/88 131/87  Pulse: 81 64  Temp: 98.2 F (36.8 C)   SpO2: 100%   Weight: 209 lb (94.8 kg)   Height: 5' 7 (1.702 m)   PainSc: 7    PainLoc: Nose   Body mass index is 32.73 kg/m.   Physical Exam:   Constitutional: well-appearing female sitting in exam chair, in no acute distress. Ambulates with use of cane  assistance device HEENT: normocephalic atraumatic, mucous membranes moist Cardiovascular: regular rate and rhythm, bilateral radial pulses 2+, bilateral dorsal pedal pulses 2+, brisk capillary refill bilateral feet and hands  Pulmonary/Chest: normal work of breathing on room air, lungs clear to auscultation bilaterally Abdominal: soft, non-tender, non-distended MSK: normal bulk and tone. Neurological: alert & oriented x 3 Skin: warm and dry Psych: mood calm, behavior normal, thought content normal, judgement normal    Last hemoglobin A1c Lab Results  Component Value Date   HGBA1C 6.0 (A) 09/17/2023      The ASCVD Risk score (Arnett DK, et al., 2019) failed to calculate for the following reasons:   Cannot find a previous HDL lab   Cannot find a previous total cholesterol lab      Assessment & Plan  Problem List Items Addressed This Visit       Cardiovascular and Mediastinum   HTN (hypertension) (Chronic)   Has a history of hypertension and blood pressure has been well-controlled on hydrochlorothiazide  25 mg daily.  She reports complete adherence to this. Blood pressure today 145/88 and then 131/87 on repeat.  We will obtain BMP today to check kidney function and electrolytes. - Continue chlorothiazide 25 mg daily - f/u BMP       Relevant Orders   Basic metabolic panel with GFR     Musculoskeletal and Integument   Degenerative disc disease at L5-S1 level   Relevant Medications   gabapentin  (NEURONTIN ) 300 MG capsule     Other   Encounter for smoking cessation counseling - Primary (Chronic)   Patient has been working on smoking cessation, and today reports that she is still using about 0.5 PPD, though when she does because arete she does not smoke the whole thing which is an improvement from before.  She has Chantix  which has been previously prescribed, though she has not initiated this yet.  She has a pack-year history of 24 years, and has never undergone low-dose CT lung  cancer screening.  I discussed this with her today and recommend obtaining low-dose CT lung cancer scan.  I continue to encourage her to work on smoking cessation, and we discussed this for ~5 minutes. - Continue to work on smoking cessation - Chantix  already prescribed - LDCT Chest scan order placed       Relevant Orders   CT CHEST LUNG CANCER SCREENING LOW DOSE WO CONTRAST   Pre-diabetes (Chronic)   Patient has a history of prediabetes, with last A1c in 09/17/2023 of 6.0.  She has been working on dietary and lifestyle modifications, though has not yet joined the gym and plans to do so.  Will obtain repeat A1c today and if worsening then can consider metformin initiation. - f/u A1c       Relevant Orders   Hemoglobin A1c   History of total knee replacement, right  Relevant Medications   gabapentin  (NEURONTIN ) 300 MG capsule    Return in 3 months (on 05/12/2024) for BP recheck.  Patient discussed with Dr. Karna, who also saw and evaluated the patient.  Doyal Miyamoto, MD [facility]  PGY-1 02/12/2024, 1:53 PM

## 2024-02-12 NOTE — Assessment & Plan Note (Signed)
 Patient has a history of prediabetes, with last A1c in 09/17/2023 of 6.0.  She has been working on dietary and lifestyle modifications, though has not yet joined the gym and plans to do so.  Will obtain repeat A1c today and if worsening then can consider metformin initiation. - f/u A1c

## 2024-02-12 NOTE — Patient Instructions (Addendum)
 Thank you, Christy Burke for allowing us  to provide your care today. Today we discussed the following:  - I will order a low dose CT scan of your lungs to check for lung cancer - I sent a refill of your medications to the pharmacy - Continue to work on your smoking cessation and go ahead and join the gym! - take your blood pressure at home, and if it continues to remain > 130/90, let us  know and we may need to adjust your blood pressure medication   I have ordered the following labs for you:  Lab Orders         Basic metabolic panel with GFR         Hemoglobin A1c       Tests ordered today:  CT Lung scan    I have ordered the following medication/changed the following medications:     Start the following medications: Meds ordered this encounter  Medications                  gabapentin  (NEURONTIN ) 300 MG capsule    Sig: Take 1 capsule (300 mg total) by mouth at bedtime.    Dispense:  90 capsule    Refill:  3     Follow up: 4-6 months    Should you have any questions or concerns please call the Internal Medicine Clinic at [phone].     Doyal Miyamoto, MD [facility]

## 2024-02-13 ENCOUNTER — Ambulatory Visit: Payer: Self-pay

## 2024-02-13 LAB — BASIC METABOLIC PANEL WITH GFR
BUN/Creatinine Ratio: 11 — ABNORMAL LOW (ref 12–28)
BUN: 8 mg/dL (ref 8–27)
CO2: 21 mmol/L (ref 20–29)
Calcium: 9.7 mg/dL (ref 8.7–10.3)
Chloride: 101 mmol/L (ref 96–106)
Creatinine, Ser: 0.76 mg/dL (ref 0.57–1.00)
Glucose: 83 mg/dL (ref 70–99)
Potassium: 4.2 mmol/L (ref 3.5–5.2)
Sodium: 141 mmol/L (ref 134–144)
eGFR: 89 mL/min/1.73 (ref >59)

## 2024-02-13 LAB — HEMOGLOBIN A1C
Est. average glucose Bld gHb Est-mCnc: 126 mg/dL
Hgb A1c MFr Bld: 6 % — ABNORMAL HIGH (ref 4.8–5.6)

## 2024-02-19 NOTE — Progress Notes (Signed)
 Internal Medicine Clinic Attending Patient was precepted with Dr. Karna who has since gone out on medical leave.  I have reviewed the residents history and exam and pertinent patient test results.  I agree with the assessment, diagnosis, and plan of care documented in the residents note.

## 2024-02-25 ENCOUNTER — Ambulatory Visit
Admission: RE | Admit: 2024-02-25 | Discharge: 2024-02-25 | Disposition: A | Discharge status: Home / Self Care | Source: Ambulatory Visit | Attending: Internal Medicine | Admitting: Internal Medicine

## 2024-02-25 ENCOUNTER — Other Ambulatory Visit: Payer: Self-pay

## 2024-02-25 DIAGNOSIS — F1190 Opioid use, unspecified, uncomplicated: Secondary | ICD-10-CM

## 2024-02-25 DIAGNOSIS — Z716 Tobacco abuse counseling: Secondary | ICD-10-CM

## 2024-02-25 MED ORDER — HYDROCODONE-ACETAMINOPHEN 10-325 MG PO TABS: 1.0000 | ORAL_TABLET | Freq: Two times a day (BID) | ORAL | 0 refills | Status: DC | PRN

## 2024-02-25 NOTE — Telephone Encounter (Signed)
 Last rx written - 01/28/24. Last OV - 02/12/24. TOX - 09/17/23.

## 2024-02-25 NOTE — Telephone Encounter (Signed)
 Copied from CRM [id number]. Topic: Clinical - Medication Refill >> Feb 25, 2024  9:10 AM Diannia H wrote: Medication:  HYDROcodone -acetaminophen  (NORCO) 10-325 MG tablet,gabapentin  (NEURONTIN ) 300 MG capsule    Has the patient contacted their pharmacy? Yes (Agent: If no, request that the patient contact the pharmacy for the refill. If patient does not wish to contact the pharmacy document the reason why and proceed with request.) (Agent: If yes, when and what did the pharmacy advise?)  This is the patient's preferred pharmacy:  [facility] [address] [place] - [address] [place] [address] Phone: [phone] Fax: [phone]  Is this the correct pharmacy for this prescription? Yes If no, delete pharmacy and type the correct one.   Has the prescription been filled recently? Yes  Is the patient out of the medication? No  Has the patient been seen for an appointment in the last year OR does the patient have an upcoming appointment? Yes  Can we respond through MyChart? Yes  Agent: Please be advised that Rx refills may take up to 3 business days. We ask that you follow-up with your pharmacy.

## 2024-03-04 ENCOUNTER — Telehealth: Payer: Self-pay | Admitting: *Deleted

## 2024-03-04 ENCOUNTER — Ambulatory Visit: Payer: Self-pay

## 2024-03-04 ENCOUNTER — Telehealth: Payer: Self-pay

## 2024-03-04 NOTE — Telephone Encounter (Signed)
 Prior Authorization has been submitted awaiting approval or denial.

## 2024-03-04 NOTE — Telephone Encounter (Signed)
 Copied from CRM [id number]. Topic: Clinical - Medication Refill >> Feb 25, 2024  9:10 AM Diannia H wrote: Medication:  HYDROcodone -acetaminophen  (NORCO) 10-325 MG tablet,gabapentin  (NEURONTIN ) 300 MG capsule    Has the patient contacted their pharmacy? Yes (Agent: If no, request that the patient contact the pharmacy for the refill. If patient does not wish to contact the pharmacy document the reason why and proceed with request.) (Agent: If yes, when and what did the pharmacy advise?)  This is the patient's preferred pharmacy:  [facility] [address] [place] - [address] [place] [address] Phone: [phone] Fax: [phone]  Is this the correct pharmacy for this prescription? Yes If no, delete pharmacy and type the correct one.   Has the prescription been filled recently? Yes  Is the patient out of the medication? No  Has the patient been seen for an appointment in the last year OR does the patient have an upcoming appointment? Yes  Can we respond through MyChart? Yes  Agent: Please be advised that Rx refills may take up to 3 business days. We ask that you follow-up with your pharmacy. >> Mar 04, 2024 11:17 AM Miquel SAILOR wrote: HYDROcodone -acetaminophen  (NORCO) 10-325 MG tablet,gabapentin  (NEURONTIN ) 300 MG capsule -PT stated Pharmacy requested Prior Auth. She need this done ASAP due to at the Pharmacy now. Called office and transferred. Over heard Pharmacist state to Pt if they put in now they can take care of it.

## 2024-03-04 NOTE — Telephone Encounter (Signed)
 Prior Authorization for patient (HYDROcodone -Acetaminophen  10-325MG  tablets) came through on cover my meds was submitted with last office notes awaiting approval or denial.  XZB:[id number]

## 2024-03-04 NOTE — Telephone Encounter (Signed)
 FYI Only or Action Required?: Action required by provider: clinical question for provider.  Patient was last seen in primary care on 02/12/2024 by Leontine Lapine, MD.  Called Nurse Triage reporting Advice Only.  Symptoms began today.  Triage Disposition: Call PCP When Office is Open  Patient/caregiver understands and will follow disposition?: No, wishes to speak with PCP   Copied from CRM #[id number]. Topic: Clinical - Prescription Issue >> Mar 04, 2024 12:11 PM DeAngela L wrote: Reason for CRM:  patient returning call after dropped call about PA question about HYDROcodone -acetaminophen  (NORCO) 10-325 MG tablet patient satste the pharmacy says they sent over a request for a PA to the office and the patient is out of medication   Pt num Mobile [phone] Primary  [id number] [address], [place] - [address] [place] [address] Phone: [phone] Fax: [phone] Reason for Disposition  [1] Caller requesting NON-URGENT health information AND [2] PCP's office is the best resource    Routing information to PCP to follow up with patient  Answer Assessment - Initial Assessment Questions 1. REASON FOR CALL: What is the main reason for your call? or How can I best help you?    Pt called stating she was actively speaking to someone at the office about her PA for hydrocodone  and the phone disconnected: PAS also stated they attempted to reconnect pt with office but due to lunch hours unable to connect pt. Pt stated she is at pharmacy and unable to pick up medication due to PA: pt stated out of medication and needs someone to call her back to inform of what to do.  Please call pt.  Protocols used: Information Only Call - No Triage-A-AH

## 2024-03-06 NOTE — Telephone Encounter (Addendum)
 Sharlet Spray (Key: [id number]) PA Case ID #: [id number] Rx #: [id number] Need Help? Call us  at [phone] Outcome Approved on March 04, 2024 by [facility] 2017 NCPDP Request Reference Number: [id number]. HYDROCO/APAP TAB 10-325MG  is approved through 09/01/2024. For further questions, call [employer] at [phone]. Effective Date: 03/04/2024 Authorization Expiration Date: 09/01/2024 Drug HYDROcodone -Acetaminophen  10-325MG  tablets ePA cloud logo Form [employer] Electronic Prior Authorization Form (2017 NCPDP) Original Claim Info 76,19 Maximum Days Supply of Days Supply of 5Total MME 20.00MG   Lvm regarding the approval.

## 2024-03-12 ENCOUNTER — Telehealth: Payer: Self-pay | Admitting: *Deleted

## 2024-03-12 NOTE — Telephone Encounter (Signed)
 Copied from CRM [id number]. Topic: General - Other >> Mar 12, 2024  2:11 PM Diannia H wrote: Reason for CRM: Patient is calling to speak with Marian Pontes. Could you assist? Patients callback number is [phone]

## 2024-03-12 NOTE — Telephone Encounter (Signed)
 I called Christy Burke who stated there's not anything urgent going on ; just want to schedule an appt. Call transferred to [name]- appt has been schedule for 03/17/24.

## 2024-03-16 NOTE — Progress Notes (Unsigned)
 Patient ID: Christy Burke, female   DOB: [birth year], 64 y.o.   MRN: [id number]   HPI  Christy Burke is a 64 y.o.-year-old female, returning for follow-up for Graves' disease and Graves' ophthalmopathy.  Last visit 1 year ago.  Interim history: No tremors, palpitations, anxiety.  No unintentional weight loss. Last month she had CT scan screening for lung cancer (without contrast) this was negative.  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 Labs remained abnormal 2 weeks later.  At that time, she was started on: - Methimazole  10 mg 3x a day - Propranolol  10 mg 2x a day  Retrospectively, she did have: - hot flushes (but had a hysterectomy) - L eye bulging - some blurry vision, but not diplopia, chemosis, pain - weight loss: 20 lbs - in last 4 mo - lack of appetite - tremors - palpitations - better on beta blockers - no hyperdefecation - no insomnia - + anxiety  In 03/2017, she was 3 times a day and Inderal  10 mg twice a day.  The symptoms resolved, except for hot flashes, for which she is on HRT.  We subsequently decreased the dose of methimazole  and stopped Inderal .  However, she was not usually returning for last between appointments despite advised to do so. In 07/2018, her TSH was elevated, so I advised her to reduce the methimazole  dose to 5 mg twice a day and come back for labs in 1.5 months.  She did not return...  In 03/2019, we decreased her methimazole  dose further to 5 mg a.m. and 2.5 mg in p.m.  She did not return for labs... In 07/2019 she ran out of methimazole  5 days prior to the appointment... We restarted 5 mg in am and 2.5 mg later in the day. In 11/2019, we decreased methimazole  to 5 mg daily.  Reviewed her TFTs: Lab Results  Component Value Date   TSH 1.41 03/18/2023   TSH 1.90 04/06/2022   TSH 1.33 03/17/2021   TSH 1.31 09/12/2020   TSH 2.73 03/18/2020    TSH 2.35 11/13/2019   TSH 0.91 07/10/2019   TSH 2.66 03/11/2019   TSH 1.32 11/05/2018   TSH 11.77 (H) 07/16/2018   FREET4 1.0 03/18/2023   FREET4 0.66 04/06/2022   FREET4 0.80 03/17/2021   FREET4 0.69 09/12/2020   FREET4 0.66 03/18/2020   FREET4 0.69 11/13/2019   FREET4 0.80 07/10/2019   FREET4 0.65 03/11/2019   FREET4 0.77 11/05/2018   FREET4 0.50 (L) 07/16/2018   T3FREE 3.3 03/18/2023   T3FREE 2.9 04/06/2022   T3FREE 3.9 03/17/2021   T3FREE 3.6 09/12/2020   T3FREE 3.1 03/18/2020   T3FREE 3.5 11/13/2019   T3FREE 3.1 07/10/2019   T3FREE 2.9 03/11/2019   T3FREE 3.0 11/05/2018   T3FREE 2.9 07/16/2018   Her Graves' antibodies were elevated: Lab Results  Component Value Date   TSI 246 (H) 03/18/2023   TSI 257 (H) 04/06/2022   TSI 222 (H) 03/17/2021   TSI 236 (H) 03/11/2019   TSI 319 (H) 07/26/2017   Component     Latest Ref Rng & Units 04/15/2017  Thyrotropin Receptor Ab     0.00 - 1.75 IU/L 12.16 (H)   Pt denies: - feeling nodules in neck - hoarseness - dysphagia - choking  Pt does have a FH of thyroid  ds.: 2nd cousin and niece with Graves ds. No FH of thyroid  cancer.  No h/o radiation tx to head or neck. No recent contrast studies. No herbal supplements. No Biotin use.   Pt. also has a history of kidney stones in 2012, 2014, 2016, 2018.  She has a history of lithotripsy.  She has no double vision, chemosis, or eye pain.  She follows with ophthalmology every year. She saw her eye dr. At [facility] >> small cataract. She has itchy eyes.  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  Wears eye protectors and mask. She had TAH several years ago. She developed hot flushes after this.  ROS: + see HPI  I reviewed pt's medications, allergies, PMH, social hx, family hx, and changes were documented in the history of present illness. Otherwise, unchanged from my initial visit note.  Past Medical History:  Diagnosis Date   Asthma    History of  umbilical hernia    Hypertension    Influenza due to identified novel influenza A virus with other respiratory manifestations 06/30/2022   Osteoarthritis of right thumb 07/26/2016   Paresthesia of arm 11/05/2018   Plantar fasciitis of right foot 08/26/2019   Renal stones    Right hand pain 02/09/2016   Tendonitis of wrist, left 12/11/2014   Thumb pain, right 02/09/2016   Past Surgical History:  Procedure Laterality Date   lithotrypsy     TOTAL ABDOMINAL HYSTERECTOMY W/ BILATERAL SALPINGOOPHORECTOMY     TOTAL KNEE ARTHROPLASTY Right 01/2022   UMBILICAL HERNIA REPAIR  03/05/1988   Social History   Socioeconomic History   Marital status: Married    Spouse name: Not on file   Number of children: 9: 62 year old in 2019   Years of education: Not on file   Highest education level: Not on file  Occupational History    Musician  Social Needs   Financial resource strain: Not on file   Food insecurity:    Worry: Not on file    Inability: Not on file   Transportation needs:    Medical: Not on file    Non-medical: Not on file  Tobacco Use   Smoking status: Current Every Day Smoker    Packs/day: 1.00    Years: 39.00    Pack years: 39.00    Types: Cigarettes   Smokeless tobacco: Never Used   Tobacco comment: wants Nicotine  gum. 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 1 each 0    gabapentin  (NEURONTIN ) 300 MG capsule Take 1 capsule (300 mg total) by mouth at bedtime. 90 capsule 3   hydrochlorothiazide  (HYDRODIURIL ) 25 MG tablet Take 1 tablet (25 mg total) by mouth daily. 90 tablet 3   HYDROcodone -acetaminophen  (NORCO) 10-325 MG tablet Take 1 tablet by mouth 2 (two) times daily as needed. 60 tablet 0   methimazole  (TAPAZOLE ) 5 MG tablet TAKE 1 TABLET BY  MOUTH IN THE MORNING 90 tablet 3   varenicline  (CHANTIX  CONTINUING MONTH PAK) 1 MG tablet Take 1 tablet (1 mg total) by mouth 2 (two) times daily. 60 tablet 2   varenicline  (CHANTIX ) 0.5 MG tablet Take 1 tablet (0.5 mg total) by mouth 2 (two) times daily. 8 tablet 0   varenicline  (CHANTIX ) 0.5 MG tablet Take 1 tablet (0.5 mg total) by mouth daily. 3 tablet 0   No current facility-administered medications on file prior to visit.   No Known Allergies Family History  Problem Relation Age of Onset   Breast cancer Mother    Cancer Mother    Diabetes Father    Colon cancer Brother    Esophageal cancer Neg Hx    Rectal cancer Neg Hx    Stomach cancer Neg Hx    PE: LMP 03/14/2015  Wt Readings from Last 10 Encounters:  02/12/24 209 lb (94.8 kg)  11/15/23 209 lb (94.8 kg)  10/15/23 206 lb 6.4 oz (93.6 kg)  09/17/23 208 lb (94.3 kg)  03/27/23 202 lb (91.6 kg)  03/18/23 194 lb 9.6 oz (88.3 kg)  02/14/23 197 lb (89.4 kg)  01/16/23 201 lb 14.4 oz (91.6 kg)  12/18/22 197 lb (89.4 kg)  10/17/22 194 lb (88 kg)   Constitutional: overweight, in NAD, walks with cane Eyes: EOMI, no exophthalmos ENT: + symmetric, nonnodular, thyromegaly, no cervical lymphadenopathy Cardiovascular: RRR, No MRG Respiratory: CTA B Musculoskeletal: no deformities Skin: no rashes Neurological: no tremor with outstretched hands  ASSESSMENT: 1. Graves ds.   2.  Graves' ophthalmopathy  PLAN:  1. Patient with history of enlarged thyroid  discovered by patient when she was looking in the mirror in 03/2017.  Around that time, she was also found  to have thyrotoxicosis.  Thyrotropin receptor antibodies were elevated, pointing towards Graves' disease.  She was started on methimazole  10 mg daily and Inderal  10 mg twice a day.  Retrospectively, she noticed weight loss, heat intolerance, palpitations, tremors, increased appetite, anxiety.  Symptoms resolved after starting the above medications.  However, she was not compliant with her thyroid  hormone checks and we could not adjust the methimazole  dose in a timely fashion.  In 03/2019, we decreased the dose of methimazole  and she again did not reason for labs as recommended.  She also ran out of methimazole .  I strongly advised her against these practices after discussion about possible consequences of uncontrolled hypothyroidism.  She continues on 5 mg of methimazole  daily, which we did not change at last visit since her TFTs were normal. - She has no thyrotoxic signs or symptoms at today's visit.  She has a history of hot flashes developed after TAH surgery still likely postmenopausal.  No tremors, palpitations, anxiety, insomnia.  No unintentional weight loss. - Today's visit we will recheck her TFTs and change the methimazole  dose accordingly.  She will need a refill afterwards -I Will see her back in a year but possibly sooner for labs  2.  Graves' ophthalmopathy - Her exophthalmos was only slight at this improved so it is not visible anymore - She previously mentioned blurry vision but no double vision, eye pain, chemosis.  She also mentioned itchy eyes, possibly due to exposure to fumes at her workplace - Her TSI antibodies were rechecked at last visit in 03/2023 and they were still elevated, but slightly better than before, at 246 (<140%) - Will repeat her TSI antibodies now - She will continue to follow with ophthalmology on a yearly basis  No orders of the defined types were placed in this encounter.  Needs refills of MMI.  Lela Fendt, MD PhD [facility] Endocrinology

## 2024-03-17 ENCOUNTER — Ambulatory Visit (INDEPENDENT_AMBULATORY_CARE_PROVIDER_SITE_OTHER): Payer: Medicaid Other | Admitting: Internal Medicine

## 2024-03-17 ENCOUNTER — Encounter: Payer: Self-pay | Admitting: Internal Medicine

## 2024-03-17 ENCOUNTER — Other Ambulatory Visit

## 2024-03-17 ENCOUNTER — Ambulatory Visit (INDEPENDENT_AMBULATORY_CARE_PROVIDER_SITE_OTHER): Payer: Self-pay | Admitting: Licensed Clinical Social Worker

## 2024-03-17 VITALS — BP 120/84 | HR 77 | Ht 67.0 in | Wt 204.4 lb

## 2024-03-17 DIAGNOSIS — E0500 Thyrotoxicosis with diffuse goiter without thyrotoxic crisis or storm: Secondary | ICD-10-CM | POA: Diagnosis not present

## 2024-03-17 DIAGNOSIS — H05839 Thyroid orbitopathy, unspecified orbit: Secondary | ICD-10-CM | POA: Diagnosis not present

## 2024-03-17 DIAGNOSIS — F4381 Prolonged grief disorder: Secondary | ICD-10-CM

## 2024-03-17 NOTE — BH Specialist Note (Signed)
 Integrated Behavioral Health Follow Up In-Person Visit  MRN: [id number] Name: Charlann Wayne  Number of Integrated Behavioral Health Clinician visits: Additional Visit  Session Start time: 1330   Session End time: 1430  Total time in minutes: 60    TSubjective: Laylia Mui is a 64 y.o. female  Patient was referred by PCP for [facility].   Patient reports the following symptoms/concerns:    Patient may benefit from:   Grief counseling with [facility] for specialized bereavement care   Ongoing counseling with [facility] to continue cognitive restructuring and coping skills   Marital counseling to address relationship conflict and improve communication skills.   Duration of problem: More than 5 years; Severity of problem: mild   Objective: Mood: NA and Affect: Appropriate Risk of harm to self or others: No plan to harm self or others   Life Context: Family and Social: Married more than 32 years and has a best friend. School/Work: Patient currently enrolled in an associates program at [employer] Self-Care: Patient aspires to travel.  Life Changes: Loss of both parents, marital conflict   Patient and/or Family's Strengths/Protective Factors: Sense of purpose   Goals Addressed: Patient will:  Reduce symptoms of: Prolonged Grief, self improvement    Increase knowledge and/or ability of: coping skills      Progress towards Goals: Ongoing   Interventions: Interventions utilized:  Solution-Focused Strategies and CBT Cognitive Behavioral Therapy Standardized Assessments completed: Declined on today        Complex Grief disorder   Patient and/or Family Response: Patient finds counseling beneficial.    Patient Centered Plan: Patient is on the following Treatment Plan(s): Monthly sessions with [facility]   Assessment:  Client also processed relationship stressors and recent positives. Client reported things have been so far so good since partners birthday event, while  still describing concern about unpredictable mood/behavior shifts  and not wanting to jinx the current calm. Therapist supported reality-based thinking: appreciating the positive moment while also noticing patterns and staying grounded in what client needs to feel emotionally safe. Client described the birthday gathering as successful, named supportive attendees, and talked through family/friend dynamics and boundaries (including relief that a particular family member did not attend due to prior concerns). Client appeared proud of hosting and the sense of community, and described the event details with warmth (location, guests, food/menu, shared responsibilities).  Remaining of session discussed how she's been grieving the loss of her father. It will be two years in October.  Client also discussed her health and goal to lose weight.   Interventions included: reflective listening; normalization of relocation ambivalence and grief-related sensitivity to change; MI strategies to explore values, boundaries, and control vs. responsibility; problem-solving around concrete planning (visits, pros/cons, transportation and cost realities); gentle cognitive reframing to reduce carrying others decisions; and relationship-focused check-in to assess patterns, supports, and current functioning. Therapist completed a brief safety check; client denied current SI/HI and no imminent safety concerns were observed during session.  Client responded well to structure and reflection, demonstrated insight into both protective motivations and personal limits, and was receptive to boundary language and the idea of letting the sister do her own decision work. Clients mood appeared concerned and mildly frustrated when discussing sister/relocation, and more upbeat when discussing the successful gathering. Thought process was mildly tangential but redirectable; judgment and insight appeared fair to good.  Plan: Continue Monthly  sessions . Next session will focus on  grief triggers that show up around family change and caretaking roles. Client  encouraged to use coping skills when feeling activated (pause, paced breathing, brief walk, and one problem at a time focus). Crisis resources reviewed as appropriate.  Renda Pontes, MSW, LCSW-A She/Her Behavioral Health Clinician [facility]  Internal Medicine Center Direct Dial:[phone]  Fax [phone] Main Office Phone: [phone] [address] [place] [place], [place] [address] Website: [facility] Internal Medicine [facility]  [place], [place]  [place]

## 2024-03-17 NOTE — Patient Instructions (Addendum)
 Visit Information  Thank you for taking time to visit with me today. Please don't hesitate to contact me if I can be of assistance to you.   Renda Pontes, MSW, LCSW-A She/Her Behavioral Health Clinician [facility]  Internal Medicine Center Direct [phone]  Fax [phone] Main Office Phone: [phone] [address] [place] [place], [place] [address] Website: [facility] Internal Medicine [facility]  [place], [place]  [place]

## 2024-03-17 NOTE — Patient Instructions (Signed)
 Please continue methimazole 5 mg in a.m.  Please stop at the lab.  Please come back for a follow-up appointment in 1 year.

## 2024-03-19 ENCOUNTER — Ambulatory Visit: Payer: Self-pay | Admitting: Internal Medicine

## 2024-03-19 LAB — T4, FREE: Free T4: 1 ng/dL (ref 0.8–1.8)

## 2024-03-19 LAB — THYROID STIMULATING IMMUNOGLOBULIN: TSI: 173 %{baseline} — ABNORMAL HIGH

## 2024-03-19 LAB — T3, FREE: T3, Free: 3.2 pg/mL (ref 2.3–4.2)

## 2024-03-19 LAB — TSH: TSH: 2.55 m[IU]/L (ref 0.40–4.50)

## 2024-03-19 MED ORDER — METHIMAZOLE 5 MG PO TABS: ORAL_TABLET | ORAL | 3 refills | Status: AC

## 2024-03-19 NOTE — Addendum Note (Signed)
 Addended by: TRIXIE FILE on: 03/19/2024 04:53 PM   Modules accepted: Orders

## 2024-03-26 NOTE — Telephone Encounter (Signed)
 Called pt. And informed the patient to drop of the paperwork and she will get a call in regards to if an appointment is needed.  Copied from CRM #[id number]. Topic: General - Other >> Mar 26, 2024 11:33 AM Diannia H wrote: Reason for CRM: Patient is wanting to speak with [place] about getting a form filled out for school.She was here in 12/10 does she need to come in for an appointment to get the forms completed or can she just drop them off? Could you assist? Patients callback number is [phone]

## 2024-04-01 ENCOUNTER — Telehealth: Payer: Self-pay | Admitting: *Deleted

## 2024-04-01 ENCOUNTER — Other Ambulatory Visit: Payer: Self-pay

## 2024-04-01 DIAGNOSIS — F1190 Opioid use, unspecified, uncomplicated: Secondary | ICD-10-CM

## 2024-04-01 MED ORDER — HYDROCODONE-ACETAMINOPHEN 10-325 MG PO TABS: 1.0000 | ORAL_TABLET | Freq: Two times a day (BID) | ORAL | 0 refills | Status: DC | PRN

## 2024-04-01 NOTE — Telephone Encounter (Unsigned)
 Copied from CRM #[id number]. Topic: Clinical - Medication Question >> Apr 01, 2024  2:02 PM Diannia H wrote: Reason for CRM: Patient is calling to check the status of her medicine HYDROcodone -acetaminophen  (NORCO) 10-325 MG tablet gabapentin  (NEURONTIN ) 300 MG capsule  Could you assist? Callback number is [phone].

## 2024-04-01 NOTE — Telephone Encounter (Signed)
 Information was sent to PCP for refill. Copied from CRM #[id number]. Topic: Clinical - Medication Question >> Apr 01, 2024  2:02 PM Diannia H wrote: Reason for CRM: Patient is calling to check the status of her medicine HYDROcodone -acetaminophen  (NORCO) 10-325 MG tablet gabapentin  (NEURONTIN ) 300 MG capsule  Could you assist? Callback number is [phone].

## 2024-04-02 ENCOUNTER — Telehealth: Payer: Self-pay | Admitting: *Deleted

## 2024-04-02 ENCOUNTER — Telehealth: Admitting: Student

## 2024-04-02 DIAGNOSIS — Z96651 Presence of right artificial knee joint: Secondary | ICD-10-CM

## 2024-04-02 DIAGNOSIS — F1190 Opioid use, unspecified, uncomplicated: Secondary | ICD-10-CM

## 2024-04-02 MED ORDER — HYDROCODONE-ACETAMINOPHEN 10-325 MG PO TABS: 1.0000 | ORAL_TABLET | Freq: Two times a day (BID) | ORAL | 0 refills | Status: AC | PRN

## 2024-04-02 NOTE — Assessment & Plan Note (Signed)
 Telephone encounter for discussion with student loans.  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  Patient is on disability due to a right knee replacement and she reports that her future job is sedentary and does not require her to be standing to fully participate in her job.  Again, patient reports that she will be able to fully complete her job that she is currently in school for and is requesting student loans towards.  Patient understands that the student loan would not be forgiven due to her disability.  Letter was completed for patient.

## 2024-04-02 NOTE — Telephone Encounter (Signed)
 Will froward to [facility].                 Copied from [employer] [id number]. Topic: Clinical - Prescription Issue >> Apr 02, 2024  2:07 PM DeAngela L wrote: Reason for CRM:Ms. Oge calling with [facility] calling cause the license is not active and they need a different one to fill the patients prescription for HYDROcodone -acetaminophen  [facility]) 10-325 MG tablet  [id number] [place] ([place]), [place] - [address] [place] ([place]) [place] [address] Phone: [phone] Fax: [phone]

## 2024-04-02 NOTE — Progress Notes (Signed)
" °  [facility] Internal Medicine Residency Telephone Encounter Continuity Care Appointment  HPI:  This telephone encounter was created for [name] on 04/02/2024 for the following purpose/cc: student loan paper work. Note that the video was not connecting for either party so a telephone encounter was started.   Past Medical History:  Past Medical History:  Diagnosis Date   Asthma    History of umbilical hernia    Hypertension    Influenza due to identified novel influenza A virus with other respiratory manifestations 06/30/2022   Osteoarthritis of right thumb 07/26/2016   Paresthesia of arm 11/05/2018   Plantar fasciitis of right foot 08/26/2019   Renal stones    Right hand pain 02/09/2016   Tendonitis of wrist, left 12/11/2014   Thumb pain, right 02/09/2016     Assessment / Plan / Recommendations:  Telephone encounter for discussion with student loans.  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  Patient is on disability due to a right knee replacement and she reports that her future job is sedentary and does not require her to be standing to fully participate in her job.  Again, patient reports that she will be able to fully complete her job that she is currently in school for and is requesting student loans towards.  Patient understands that the student loan would not be forgiven due to her disability.  Letter was completed for patient.   Consent and Medical Decision Making:  Patient discussed with Dr. Shawn This is a telephone encounter between Christy Burke and Damien Lease on 04/02/2024 for discussion of student loan paperwork. The visit was conducted with the patient located at home and Damien Lease at [facility]. The patient's identity was confirmed using their DOB and current address. The patient has consented to being evaluated through a telephone encounter and understands the associated risks  (an examination cannot be done and the patient may need to come in for an appointment) / benefits (allows the patient to remain at home, decreasing exposure to coronavirus). I personally spent 5 minutes on medical discussion.     "

## 2024-04-02 NOTE — Telephone Encounter (Signed)
 PDMP reviewed during this encounter.  Meds ordered this encounter  Medications   HYDROcodone -acetaminophen  (NORCO) 10-325 MG tablet    Sig: Take 1 tablet by mouth 2 (two) times daily as needed.    Dispense:  60 tablet    Refill:  0   To start 04/03/24.  Ozell Kung MD 04/02/2024, 3:32 PM

## 2024-04-03 ENCOUNTER — Telehealth: Payer: Self-pay

## 2024-04-03 NOTE — Telephone Encounter (Signed)
 I contacted pt to let her know-letter for school is ready for pick up, but no answer. I left detailed message to call the clinic back.

## 2024-04-09 NOTE — Progress Notes (Signed)
 Internal Medicine Clinic Attending  Case discussed with the resident at the time of the visit.  We reviewed the resident's history and exam and pertinent patient test results.  I agree with the assessment, diagnosis, and plan of care documented in the resident's note.

## 2024-04-14 ENCOUNTER — Ambulatory Visit: Payer: Self-pay | Admitting: Licensed Clinical Social Worker

## 2025-03-15 ENCOUNTER — Ambulatory Visit: Admitting: Internal Medicine
